# Patient Record
Sex: Male | Born: 1955 | Race: White | Hispanic: No | Marital: Married | Smoking: Never smoker
Health system: Southern US, Community
[De-identification: ages and names within clinical notes are randomized; demographics above are authoritative.]

## PROBLEM LIST (undated history)

## (undated) DIAGNOSIS — R011 Cardiac murmur, unspecified: Secondary | ICD-10-CM

## (undated) DIAGNOSIS — I340 Nonrheumatic mitral (valve) insufficiency: Secondary | ICD-10-CM

## (undated) DIAGNOSIS — E785 Hyperlipidemia, unspecified: Secondary | ICD-10-CM

## (undated) DIAGNOSIS — M545 Low back pain, unspecified: Secondary | ICD-10-CM

## (undated) DIAGNOSIS — J189 Pneumonia, unspecified organism: Secondary | ICD-10-CM

## (undated) DIAGNOSIS — G8929 Other chronic pain: Secondary | ICD-10-CM

## (undated) DIAGNOSIS — Z9889 Other specified postprocedural states: Secondary | ICD-10-CM

## (undated) DIAGNOSIS — I341 Nonrheumatic mitral (valve) prolapse: Secondary | ICD-10-CM

## (undated) DIAGNOSIS — M519 Unspecified thoracic, thoracolumbar and lumbosacral intervertebral disc disorder: Secondary | ICD-10-CM

## (undated) HISTORY — DX: Hyperlipidemia, unspecified: E78.5

## (undated) HISTORY — DX: Unspecified thoracic, thoracolumbar and lumbosacral intervertebral disc disorder: M51.9

## (undated) HISTORY — PX: WISDOM TOOTH EXTRACTION: SHX21

## (undated) HISTORY — DX: Nonrheumatic mitral (valve) insufficiency: I34.0

## (undated) HISTORY — DX: Nonrheumatic mitral (valve) prolapse: I34.1

## (undated) HISTORY — DX: Cardiac murmur, unspecified: R01.1

## (undated) HISTORY — PX: CARDIAC CATHETERIZATION: SHX172

## (undated) HISTORY — PX: VASECTOMY: SHX75

## (undated) HISTORY — PX: COLONOSCOPY: SHX174

---

## 2003-01-16 ENCOUNTER — Encounter: Admission: RE | Admit: 2003-01-16 | Discharge: 2003-01-16 | Payer: Self-pay | Admitting: Family Medicine

## 2006-01-25 ENCOUNTER — Ambulatory Visit: Payer: Self-pay | Admitting: Family Medicine

## 2006-01-25 LAB — CONVERTED CEMR LAB
ALT: 20 units/L (ref 0–40)
AST: 16 units/L (ref 0–37)
Albumin: 3.8 g/dL (ref 3.5–5.2)
Alkaline Phosphatase: 74 units/L (ref 39–117)
BUN: 14 mg/dL (ref 6–23)
Basophils Absolute: 0 10*3/uL (ref 0.0–0.1)
Basophils Relative: 0.6 % (ref 0.0–1.0)
CO2: 31 meq/L (ref 19–32)
Calcium: 9.4 mg/dL (ref 8.4–10.5)
Chloride: 107 meq/L (ref 96–112)
Chol/HDL Ratio, serum: 3.6
Cholesterol: 214 mg/dL (ref 0–200)
Creatinine, Ser: 1 mg/dL (ref 0.4–1.5)
Eosinophil percent: 2.6 % (ref 0.0–5.0)
GFR calc non Af Amer: 84 mL/min
Glomerular Filtration Rate, Af Am: 102 mL/min/{1.73_m2}
Glucose, Bld: 92 mg/dL (ref 70–99)
HCT: 46.7 % (ref 39.0–52.0)
HDL: 59.9 mg/dL (ref >39.0)
Hemoglobin: 15.4 g/dL (ref 13.0–17.0)
LDL DIRECT: 146.2 mg/dL
Lymphocytes Relative: 23.6 % (ref 12.0–46.0)
MCHC: 33 g/dL (ref 30.0–36.0)
MCV: 86.4 fL (ref 78.0–100.0)
Monocytes Absolute: 0.4 10*3/uL (ref 0.2–0.7)
Monocytes Relative: 9 % (ref 3.0–11.0)
Neutro Abs: 2.8 10*3/uL (ref 1.4–7.7)
Neutrophils Relative %: 64.2 % (ref 43.0–77.0)
PSA: 0.52 ng/mL (ref 0.10–4.00)
Platelets: 221 10*3/uL (ref 150–400)
Potassium: 4 meq/L (ref 3.5–5.1)
RBC: 5.41 M/uL (ref 4.22–5.81)
RDW: 12 % (ref 11.5–14.6)
Sodium: 144 meq/L (ref 135–145)
TSH: 3.04 microintl units/mL (ref 0.35–5.50)
Total Bilirubin: 0.9 mg/dL (ref 0.3–1.2)
Total Protein: 6.6 g/dL (ref 6.0–8.3)
Triglyceride fasting, serum: 50 mg/dL (ref 0–149)
VLDL: 10 mg/dL (ref 0–40)
WBC: 4.3 10*3/uL — ABNORMAL LOW (ref 4.5–10.5)

## 2006-04-07 ENCOUNTER — Ambulatory Visit: Payer: Self-pay | Admitting: Family Medicine

## 2007-09-12 ENCOUNTER — Ambulatory Visit: Payer: Self-pay | Admitting: Family Medicine

## 2007-09-17 ENCOUNTER — Ambulatory Visit: Payer: Self-pay | Admitting: Family Medicine

## 2007-09-17 LAB — CONVERTED CEMR LAB
Bilirubin Urine: NEGATIVE
Blood in Urine, dipstick: NEGATIVE
Glucose, Urine, Semiquant: NEGATIVE
Nitrite: NEGATIVE
Protein, U semiquant: NEGATIVE
Specific Gravity, Urine: >=1.03
Urobilinogen, UA: 0.2
WBC Urine, dipstick: NEGATIVE
pH: 6.5

## 2007-12-10 ENCOUNTER — Telehealth: Payer: Self-pay | Admitting: Family Medicine

## 2007-12-12 ENCOUNTER — Encounter: Payer: Self-pay | Admitting: Family Medicine

## 2007-12-14 ENCOUNTER — Encounter: Payer: Self-pay | Admitting: Family Medicine

## 2009-09-21 ENCOUNTER — Emergency Department (HOSPITAL_COMMUNITY): Admission: EM | Admit: 2009-09-21 | Discharge: 2009-09-22 | Payer: Self-pay | Admitting: Emergency Medicine

## 2009-10-05 ENCOUNTER — Ambulatory Visit: Payer: Self-pay | Admitting: Family Medicine

## 2009-10-05 DIAGNOSIS — H6120 Impacted cerumen, unspecified ear: Secondary | ICD-10-CM | POA: Insufficient documentation

## 2009-10-19 ENCOUNTER — Ambulatory Visit: Payer: Self-pay | Admitting: Family Medicine

## 2009-10-19 LAB — CONVERTED CEMR LAB
ALT: 19 units/L (ref 0–53)
AST: 17 units/L (ref 0–37)
Albumin: 3.9 g/dL (ref 3.5–5.2)
Alkaline Phosphatase: 68 units/L (ref 39–117)
BUN: 18 mg/dL (ref 6–23)
Basophils Absolute: 0 10*3/uL (ref 0.0–0.1)
Basophils Relative: 1.3 % (ref 0.0–3.0)
Bilirubin Urine: NEGATIVE
Bilirubin, Direct: 0.1 mg/dL (ref 0.0–0.3)
Blood in Urine, dipstick: NEGATIVE
CO2: 32 meq/L (ref 19–32)
Calcium: 9 mg/dL (ref 8.4–10.5)
Chloride: 103 meq/L (ref 96–112)
Cholesterol: 222 mg/dL — ABNORMAL HIGH (ref 0–200)
Creatinine, Ser: 0.8 mg/dL (ref 0.4–1.5)
Direct LDL: 143.7 mg/dL
Eosinophils Absolute: 0.1 10*3/uL (ref 0.0–0.7)
Eosinophils Relative: 2.2 % (ref 0.0–5.0)
GFR calc non Af Amer: 104.31 mL/min (ref >60)
Glucose, Bld: 85 mg/dL (ref 70–99)
Glucose, Urine, Semiquant: NEGATIVE
HCT: 44.7 % (ref 39.0–52.0)
HDL: 60.2 mg/dL (ref >39.00)
Hemoglobin: 15.2 g/dL (ref 13.0–17.0)
Ketones, urine, test strip: NEGATIVE
Lymphocytes Relative: 23 % (ref 12.0–46.0)
Lymphs Abs: 0.9 10*3/uL (ref 0.7–4.0)
MCHC: 34.1 g/dL (ref 30.0–36.0)
MCV: 86.4 fL (ref 78.0–100.0)
Monocytes Absolute: 0.4 10*3/uL (ref 0.1–1.0)
Monocytes Relative: 9.4 % (ref 3.0–12.0)
Neutro Abs: 2.4 10*3/uL (ref 1.4–7.7)
Neutrophils Relative %: 64.1 % (ref 43.0–77.0)
Nitrite: NEGATIVE
PSA: 0.69 ng/mL (ref 0.10–4.00)
Platelets: 201 10*3/uL (ref 150.0–400.0)
Potassium: 4.4 meq/L (ref 3.5–5.1)
Protein, U semiquant: NEGATIVE
RBC: 5.17 M/uL (ref 4.22–5.81)
RDW: 13 % (ref 11.5–14.6)
Sodium: 142 meq/L (ref 135–145)
Specific Gravity, Urine: 1.025
TSH: 3.08 microintl units/mL (ref 0.35–5.50)
Total Bilirubin: 0.6 mg/dL (ref 0.3–1.2)
Total CHOL/HDL Ratio: 4
Total Protein: 6.5 g/dL (ref 6.0–8.3)
Triglycerides: 110 mg/dL (ref 0.0–149.0)
Urobilinogen, UA: 0.2
VLDL: 22 mg/dL (ref 0.0–40.0)
WBC Urine, dipstick: NEGATIVE
WBC: 3.8 10*3/uL — ABNORMAL LOW (ref 4.5–10.5)
pH: 5

## 2009-10-26 ENCOUNTER — Ambulatory Visit: Payer: Self-pay | Admitting: Family Medicine

## 2009-10-26 DIAGNOSIS — L578 Other skin changes due to chronic exposure to nonionizing radiation: Secondary | ICD-10-CM

## 2009-10-26 DIAGNOSIS — R011 Cardiac murmur, unspecified: Secondary | ICD-10-CM

## 2009-11-20 ENCOUNTER — Ambulatory Visit: Payer: Self-pay | Admitting: Cardiology

## 2009-11-20 ENCOUNTER — Encounter: Payer: Self-pay | Admitting: Family Medicine

## 2009-11-20 ENCOUNTER — Ambulatory Visit (HOSPITAL_COMMUNITY): Admission: RE | Admit: 2009-11-20 | Discharge: 2009-11-20 | Payer: Self-pay | Admitting: Family Medicine

## 2009-11-20 ENCOUNTER — Ambulatory Visit: Payer: Self-pay

## 2009-11-23 ENCOUNTER — Telehealth: Payer: Self-pay | Admitting: Family Medicine

## 2010-01-27 ENCOUNTER — Ambulatory Visit: Payer: Self-pay | Admitting: Cardiology

## 2010-04-08 ENCOUNTER — Encounter: Payer: Self-pay | Admitting: Gastroenterology

## 2010-04-13 NOTE — Assessment & Plan Note (Signed)
Summary: ear issues//ccm   Vital Signs:  Patient profile:   55 year old male Weight:      174 pounds Temp:     97.9 degrees F oral BP sitting:   124 / 90  (left arm) Cuff size:   regular  Vitals Entered By: Kathrynn Speed CMA (October 05, 2009 2:58 PM) CC: left ear water trapped in between ear drum & ear wax? , src   History of Present Illness: Bilateral ear fullness. Noted onset a few days ago. No hearing change. Denies ear pain. History of frequent cerumen buildup in past. Denies any vertigo. Tried some wax softener without improvement.  Current Medications (verified): 1)  Adult Aspirin Ec Low Strength 81 Mg  Tbec (Aspirin) .... Once Daily  Allergies (verified): No Known Drug Allergies  Past History:  Past Medical History: Last updated: 09/17/2007 Unremarkable  Physical Exam  General:  Well-developed,well-nourished,in no acute distress; alert,appropriate and cooperative throughout examination Ears:  bilateral cerumen impaction removed with irrigation without difficulty Neck:  No deformities, masses, or tenderness noted. Lungs:  Normal respiratory effort, chest expands symmetrically. Lungs are clear to auscultation, no crackles or wheezes. Heart:  normal rate and regular rhythm.     Impression & Recommendations:  Problem # 1:  CERUMEN IMPACTION (ICD-380.4) removed by irrigation without difficulty. F/U p.r.n.  Complete Medication List: 1)  Adult Aspirin Ec Low Strength 81 Mg Tbec (Aspirin) .... Once daily

## 2010-04-13 NOTE — Progress Notes (Signed)
  Phone Note Outgoing Call   Summary of Call: I reviewed the summary of the echo with the posterior leaflet of the mitral valve incompetent.  Patient requesting cardiology consult with Dr. Peter Swaziland Initial call taken by: Roderick Pee MD,  November 23, 2009 2:28 PM

## 2010-04-13 NOTE — Assessment & Plan Note (Signed)
Summary: CPX/cb   Vital Signs:  Patient profile:   55 year old male Height:      70 inches Weight:      172 pounds BMI:     24.77 Temp:     98.2 degrees F oral BP sitting:   130 / 80  (left arm) Cuff size:   regular  Vitals Entered By: Kern Reap CMA Duncan Dull) (October 26, 2009 1:57 PM) CC: cpx Is Patient Diabetic? No Pain Assessment Patient in pain? no        CC:  cpx.  History of Present Illness: Alan Mcguire is a 55 year old, married male, nonsmoker, who comes in today for general physical examination  We saw him last about two years ago.  He's done well with no major problems.  His biggest long-term risk factor is skin cancer, because he's been in the sun, so much throughout his life.  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  He seen a dermatologist and has 3 lesions removed.  Over the past two years, none of which were cancer, but they were dysplastic.  He was advised to but does not wear sunscreens.  Youngest son is going to be his last year in middle school [employer], one daughter still in high school, one daughter, Producer, television/film/video and the other daughter works for [employer] and soon to be married.  His wife Alan Mcguire  works with the heart team at the hospital  also recently he had a reaction to bee stings.  He stated he got stung by 4 or 5 yellow jackets and about a half hour 45 minutes later he began to have itching and then broke out in total body hives.  Also had some airway edema went directly to the emergency room.  In the ER.  They gave IV fluids adrenaline IV steroids.  He had no sequelae after that.  However, I recommend he see Dr. Willa Rough for further evaluation.  Last tetanus booster unknown.  Will give him a booster today  Allergies (verified): 1)  ! * Bee Stings  Past History:  Past medical, surgical, family and social histories (including risk factors) reviewed, and no changes noted (except as noted below).  Past Medical History: Reviewed history  from 09/17/2007 and no changes required. Unremarkable  Family History: Reviewed history from 09/17/2007 and no changes required.  father died of kidney failure, hypertensionmother has a history of lung cancer smoker  one brother in good healthone sister in good health  Social History: Reviewed history from 09/17/2007 and no changes required. Occupation: Married Never Smoked Alcohol use-no Drug use-no Regular exercise-yes  Review of Systems      See HPI  Physical Exam  General:  Well-developed,well-nourished,in no acute distress; alert,appropriate and cooperative throughout examination Head:  Normocephalic and atraumatic without obvious abnormalities. No apparent alopecia or balding. Eyes:  No corneal or conjunctival inflammation noted. EOMI. Perrla. Funduscopic exam benign, without hemorrhages, exudates or papilledema. Vision grossly normal. Ears:  External ear exam shows no significant lesions or deformities.  Otoscopic examination reveals clear canals, tympanic membranes are intact bilaterally without bulging, retraction, inflammation or discharge. Hearing is grossly normal bilaterally. Nose:  External nasal examination shows no deformity or inflammation. Nasal mucosa are pink and moist without lesions or exudates. Mouth:  Oral mucosa and oropharynx without lesions or exudates.  Teeth in good repair. Neck:  No deformities, masses, or tenderness noted. Chest Wall:  No deformities, masses, tenderness or gynecomastia noted. Breasts:  No masses or gynecomastia noted Lungs:  Normal respiratory effort, chest expands symmetrically. Lungs are clear to auscultation, no crackles or wheezes. Heart:  the PMI is in the fifth intercostal space in the midclavicular line.  No heaves, or thrills.  S1, S2, within normal limits.  This grade 2 soft, blowing diastolic murmur, heard best at the apex, consistent with a mitral regurg-type murmur.  This is a new physical finding.  An echo will be done for  verification Abdomen:  Bowel sounds positive,abdomen soft and non-tender without masses, organomegaly or hernias noted. Rectal:  No external abnormalities noted. Normal sphincter tone. No rectal masses or tenderness. Genitalia:  Testes bilaterally descended without nodularity, tenderness or masses. No scrotal masses or lesions. No penis lesions or urethral discharge. Prostate:  no nodules, no asymmetry, and 1+ enlarged.   Msk:  No deformity or scoliosis noted of thoracic or lumbar spine.   Pulses:  R and L carotid,radial,femoral,dorsalis pedis and posterior tibial pulses are full and equal bilaterally Extremities:  No clubbing, cyanosis, edema, or deformity noted with normal full range of motion of all joints.   Neurologic:  No cranial nerve deficits noted. Station and gait are normal. Plantar reflexes are down-going bilaterally. DTRs are symmetrical throughout. Sensory, motor and coordinative functions appear intact. Skin:  one scar anterior chest wall, another scar, left anterior chest wall and a scar on his back.  The two and his chest well healed.  The one the back has some irregular margins.  Advised him to go back and see the dermatologist Cervical Nodes:  No lymphadenopathy noted Axillary Nodes:  No palpable lymphadenopathy Inguinal Nodes:  No significant adenopathy Psych:  Cognition and judgment appear intact. Alert and cooperative with normal attention span and concentration. 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 Once daily  Other Orders: Tdap => 80yrs IM (20254) Admin 1st Vaccine (27062)  Patient Instructions: 1)  remembered to wear the SPF 50+ screens and hat  2)  We will set you up for an echocardiogram to determine the etiology of the new murmur.  When we get the report I will call you 3)  If you have any major surgery or major dental work.  Let us know we would give you a prophylactic antibiotics 4)  Take an Aspirin every day. 5)  Please schedule a follow-up appointment in 1 year.   Immunizations Administered:  Tetanus Vaccine:    Vaccine Type: Tdap    Site: left deltoid    Mfr: GlaxoSmithKline    Dose: 0.5 ml    Route: IM    Given by: Kern Reap CMA (AAMA)    Exp. Date: 09/11/2011    Lot #: [id number]    VIS given: 01/30/07 version given October 26, 2009.    Physician counseled: yes

## 2010-04-15 NOTE — Letter (Signed)
Summary: Colonoscopy Letter  [facility]  [address] [place], [place] [address]   Phone: [phone]  Fax: [phone]      April 08, 2010 MRN: [id number]   Alan Mcguire [address] [place], [place]  [address]   Dear [name],   According to your medical record, it is time for you to schedule a Colonoscopy. The American Cancer Society recommends this procedure as a method to detect early colon cancer. Patients with a family history of colon cancer, or a personal history of colon polyps or inflammatory bowel disease are at increased risk.  This letter has been generated based on the recommendations made at the time of your procedure. If you feel that in your particular situation this may no longer apply, please contact our office.  Please call our office at [phone] to schedule this appointment or to update your records at your earliest convenience.  Thank you for cooperating with [place] to provide you with the very best care possible.   Sincerely,  Judie Petit T. Russella Dar, M.D.  [facility] [phone]

## 2010-05-30 LAB — POCT I-STAT, CHEM 8
BUN: 20 mg/dL (ref 6–23)
Calcium, Ion: 1.16 mmol/L (ref 1.12–1.32)
Chloride: 107 mEq/L (ref 96–112)
Creatinine, Ser: 1.1 mg/dL (ref 0.4–1.5)
Glucose, Bld: 97 mg/dL (ref 70–99)
HCT: 49 % (ref 39.0–52.0)
Hemoglobin: 16.7 g/dL (ref 13.0–17.0)
Potassium: 3.9 mEq/L (ref 3.5–5.1)
Sodium: 141 mEq/L (ref 135–145)
TCO2: 25 mmol/L (ref 0–100)

## 2011-05-05 ENCOUNTER — Encounter: Payer: Self-pay | Admitting: *Deleted

## 2011-05-06 ENCOUNTER — Encounter: Payer: Self-pay | Admitting: *Deleted

## 2011-05-06 DIAGNOSIS — I341 Nonrheumatic mitral (valve) prolapse: Secondary | ICD-10-CM | POA: Insufficient documentation

## 2011-05-06 DIAGNOSIS — M519 Unspecified thoracic, thoracolumbar and lumbosacral intervertebral disc disorder: Secondary | ICD-10-CM | POA: Insufficient documentation

## 2011-05-11 ENCOUNTER — Ambulatory Visit (INDEPENDENT_AMBULATORY_CARE_PROVIDER_SITE_OTHER): Payer: Medicare PPO | Admitting: Cardiology

## 2011-05-11 ENCOUNTER — Encounter: Payer: Self-pay | Admitting: Cardiology

## 2011-05-11 VITALS — BP 134/77 | HR 72 | Ht 70.0 in | Wt 184.8 lb

## 2011-05-11 DIAGNOSIS — I059 Rheumatic mitral valve disease, unspecified: Secondary | ICD-10-CM

## 2011-05-11 DIAGNOSIS — I341 Nonrheumatic mitral (valve) prolapse: Secondary | ICD-10-CM

## 2011-05-11 NOTE — Patient Instructions (Signed)
We will schedule you for an echocardiogram.  I will see you again in 1 year.

## 2011-05-11 NOTE — Progress Notes (Signed)
   Alan Mcguire Date of Birth: 1955/03/27 Medical Record #[id number]  History of Present Illness: Alan Mcguire is seen for yearly followup. He has a history of mitral valve prolapse with moderate mitral insufficiency. His last echocardiogram was in September of 2011. He reports no complaints on followup today. He denies any dyspnea, palpitations, or chest pain. He remains active and very exercises regularly. He denies any new medical problems over the past year.  Current Outpatient Prescriptions on File Prior to Visit  Medication Sig Dispense Refill  . aspirin EC 81 MG tablet Take 81 mg by mouth daily.        Allergies  Allergen Reactions  . Other     Bee stings    Past Medical History  Diagnosis Date  . MVP (mitral valve prolapse)   . Lumbar disc disease     L4-5  . Murmur 9/92011    echo-MVP posterior leaflet with moderate mitral insufficiency and the jet directed anteriorly,left atrium mildly enlarged    History reviewed. No pertinent past surgical history.  History  Smoking status  . Never Smoker   Smokeless tobacco  . Not on file    History  Alcohol Use No    Family History  Problem Relation Age of Onset  . Kidney failure Father     died  . Hypertension Mother   . Lung cancer Mother     Review of Systems: As noted in history of present illness .  All other systems were reviewed and are negative.  Physical Exam: BP 134/77  Pulse 72  Ht 5\' 10"  (1.778 m)  Wt 184 lb 12.8 oz (83.825 kg)  BMI 26.52 kg/m2  he is a well-developed white male in no acute distress. His HEENT exam is unremarkable. He has no JVD or bruits. Lungs are clear. Cardiac exam reveals a regular rate and rhythm with a grade 2/6 midsystolic murmur at the apex.  There is no diastolic murmur or S3. Abdomen is soft and nontender without masses or bruits. Extremities are without edema. Pulses are 2+ and symmetric.  LABORATORY DATA: ECG today shows normal sinus rhythm. Minimal voltage criteria for  LVH. It is otherwise normal.  Assessment / Plan:

## 2011-05-11 NOTE — Assessment & Plan Note (Signed)
He is asymptomatic. His exam is stable today. We'll schedule him for a followup echocardiogram and compare with September 2011. If it is stable we will plan on followup again in one year for clinical exam.

## 2011-05-25 ENCOUNTER — Other Ambulatory Visit: Payer: Self-pay

## 2011-05-25 ENCOUNTER — Ambulatory Visit (HOSPITAL_COMMUNITY): Payer: Medicare PPO | Attending: Cardiology

## 2011-05-25 DIAGNOSIS — I379 Nonrheumatic pulmonary valve disorder, unspecified: Secondary | ICD-10-CM | POA: Insufficient documentation

## 2011-05-25 DIAGNOSIS — I080 Rheumatic disorders of both mitral and aortic valves: Secondary | ICD-10-CM | POA: Insufficient documentation

## 2011-05-25 DIAGNOSIS — I059 Rheumatic mitral valve disease, unspecified: Secondary | ICD-10-CM

## 2011-05-25 DIAGNOSIS — I341 Nonrheumatic mitral (valve) prolapse: Secondary | ICD-10-CM

## 2011-05-25 DIAGNOSIS — I079 Rheumatic tricuspid valve disease, unspecified: Secondary | ICD-10-CM | POA: Insufficient documentation

## 2011-06-01 ENCOUNTER — Ambulatory Visit (INDEPENDENT_AMBULATORY_CARE_PROVIDER_SITE_OTHER): Payer: Medicare PPO | Admitting: *Deleted

## 2011-06-01 ENCOUNTER — Encounter: Payer: Self-pay | Admitting: *Deleted

## 2011-06-01 DIAGNOSIS — I781 Nevus, non-neoplastic: Secondary | ICD-10-CM | POA: Insufficient documentation

## 2011-06-01 NOTE — Progress Notes (Signed)
X=.3% Sotradecol administered with a 27g butterfly.  Patient received a total of 6cc foam.  Treated all areas of concern. Small blue spiders. Tol well. Easy access. Follow prn.  Cutaneous Laser:pulsed mode  2.6 watts 30 secs.  pulses .3  Total pulses: 79 Total energy 205  Total time:.02  Photos: yes  Compression stockings applied: yes

## 2011-06-20 ENCOUNTER — Other Ambulatory Visit (INDEPENDENT_AMBULATORY_CARE_PROVIDER_SITE_OTHER): Payer: Medicare PPO

## 2011-06-20 DIAGNOSIS — Z125 Encounter for screening for malignant neoplasm of prostate: Secondary | ICD-10-CM

## 2011-06-20 DIAGNOSIS — R011 Cardiac murmur, unspecified: Secondary | ICD-10-CM

## 2011-06-20 DIAGNOSIS — Z0000 Encounter for general adult medical examination without abnormal findings: Secondary | ICD-10-CM

## 2011-06-20 DIAGNOSIS — Z13220 Encounter for screening for lipoid disorders: Secondary | ICD-10-CM

## 2011-06-20 LAB — CBC WITH DIFFERENTIAL/PLATELET
Basophils Absolute: 0 10*3/uL (ref 0.0–0.1)
Basophils Relative: 0.7 % (ref 0.0–3.0)
Eosinophils Absolute: 0.1 10*3/uL (ref 0.0–0.7)
Eosinophils Relative: 1.2 % (ref 0.0–5.0)
HCT: 45.9 % (ref 39.0–52.0)
Hemoglobin: 15.2 g/dL (ref 13.0–17.0)
Lymphocytes Relative: 12.7 % (ref 12.0–46.0)
Lymphs Abs: 0.8 10*3/uL (ref 0.7–4.0)
MCHC: 33.1 g/dL (ref 30.0–36.0)
MCV: 87.5 fl (ref 78.0–100.0)
Monocytes Absolute: 0.5 10*3/uL (ref 0.1–1.0)
Monocytes Relative: 7.7 % (ref 3.0–12.0)
Neutro Abs: 5.1 10*3/uL (ref 1.4–7.7)
Neutrophils Relative %: 77.7 % — ABNORMAL HIGH (ref 43.0–77.0)
Platelets: 191 10*3/uL (ref 150.0–400.0)
RBC: 5.25 Mil/uL (ref 4.22–5.81)
RDW: 13 % (ref 11.5–14.6)
WBC: 6.6 10*3/uL (ref 4.5–10.5)

## 2011-06-20 LAB — POCT URINALYSIS DIPSTICK
Bilirubin, UA: NEGATIVE
Blood, UA: NEGATIVE
Glucose, UA: NEGATIVE
Ketones, UA: NEGATIVE
Leukocytes, UA: NEGATIVE
Nitrite, UA: NEGATIVE
Spec Grav, UA: 1.02
Urobilinogen, UA: 0.2
pH, UA: 7

## 2011-06-20 LAB — HEPATIC FUNCTION PANEL
ALT: 15 U/L (ref 0–53)
AST: 16 U/L (ref 0–37)
Albumin: 4.4 g/dL (ref 3.5–5.2)
Alkaline Phosphatase: 75 U/L (ref 39–117)
Bilirubin, Direct: 0.1 mg/dL (ref 0.0–0.3)
Total Bilirubin: 1 mg/dL (ref 0.3–1.2)
Total Protein: 7.2 g/dL (ref 6.0–8.3)

## 2011-06-20 LAB — LIPID PANEL
Cholesterol: 215 mg/dL — ABNORMAL HIGH (ref 0–200)
HDL: 67.8 mg/dL (ref >39.00)
Total CHOL/HDL Ratio: 3
Triglycerides: 86 mg/dL (ref 0.0–149.0)
VLDL: 17.2 mg/dL (ref 0.0–40.0)

## 2011-06-20 LAB — BASIC METABOLIC PANEL
BUN: 17 mg/dL (ref 6–23)
CO2: 29 mEq/L (ref 19–32)
Calcium: 9.3 mg/dL (ref 8.4–10.5)
Chloride: 105 mEq/L (ref 96–112)
Creatinine, Ser: 0.9 mg/dL (ref 0.4–1.5)
GFR: 91.92 mL/min (ref >60.00)
Glucose, Bld: 90 mg/dL (ref 70–99)
Potassium: 3.8 mEq/L (ref 3.5–5.1)
Sodium: 142 mEq/L (ref 135–145)

## 2011-06-20 LAB — LDL CHOLESTEROL, DIRECT: Direct LDL: 123.4 mg/dL

## 2011-06-20 LAB — PSA: PSA: 5.05 ng/mL — ABNORMAL HIGH (ref 0.10–4.00)

## 2011-06-20 LAB — TSH: TSH: 2.72 u[IU]/mL (ref 0.35–5.50)

## 2011-06-22 ENCOUNTER — Telehealth: Payer: Self-pay | Admitting: Family Medicine

## 2011-06-22 NOTE — Telephone Encounter (Signed)
Left message on machine for patient

## 2011-06-22 NOTE — Telephone Encounter (Signed)
Pt already had lab work but had to reschedule the physical for July. Does pt need to come in sooner since he already receive lab work or will this appt be all right?

## 2011-06-30 ENCOUNTER — Encounter: Payer: Self-pay | Admitting: Family Medicine

## 2011-09-20 ENCOUNTER — Ambulatory Visit (INDEPENDENT_AMBULATORY_CARE_PROVIDER_SITE_OTHER): Payer: Medicare PPO | Admitting: Family Medicine

## 2011-09-20 ENCOUNTER — Encounter: Payer: Self-pay | Admitting: Family Medicine

## 2011-09-20 VITALS — BP 110/70 | Temp 98.3°F | Ht 71.5 in | Wt 176.0 lb

## 2011-09-20 DIAGNOSIS — I059 Rheumatic mitral valve disease, unspecified: Secondary | ICD-10-CM

## 2011-09-20 DIAGNOSIS — Z0000 Encounter for general adult medical examination without abnormal findings: Secondary | ICD-10-CM

## 2011-09-20 DIAGNOSIS — R011 Cardiac murmur, unspecified: Secondary | ICD-10-CM

## 2011-09-20 DIAGNOSIS — L578 Other skin changes due to chronic exposure to nonionizing radiation: Secondary | ICD-10-CM

## 2011-09-20 DIAGNOSIS — I341 Nonrheumatic mitral (valve) prolapse: Secondary | ICD-10-CM

## 2011-09-20 DIAGNOSIS — R972 Elevated prostate specific antigen [PSA]: Secondary | ICD-10-CM

## 2011-09-20 MED ORDER — CIPROFLOXACIN HCL 250 MG PO TABS: ORAL_TABLET | ORAL | Status: DC

## 2011-09-20 NOTE — Patient Instructions (Signed)
Take the Cipro 1 twice daily for one month  Return in 6 weeks for a followup PSA  Office visit 1 week after lab  Remember the SPF 50+ screens and a hat with a bill to protect u  skin  Return in one year for general physical examination

## 2011-09-20 NOTE — Progress Notes (Signed)
  Subjective:    Patient ID: Alan Mcguire, male    DOB: 1955-11-17, 56 y.o.   MRN: [id number]  HPI [name] is a delightful 56 year old married male nonsmoker who comes in today for general physical examination  He is a history of mitral valve prolapse currently asymptomatic he gets an annual listened to by his cardiologist Dr. Swaziland. Followup in the spring of 2013 showed no change in the murmur.  He gets routine eye care, dental care, colonoscopy in his early 38s normal, tetanus 2011.  He continues to get a lot of sun exposure his occupation is swimming pool maintenance. Again advised to wear sunscreens SPF 50+ baseball cap etc.  His oldest daughter [name] got married last year, [name] is gainfully and Maybeury, [name] is going to [place] you next year hopefully in the nursing program and his son is in the 10th grade at [employer]   Review of Systems  Constitutional: Negative.   HENT: Negative.   Eyes: Negative.   Respiratory: Negative.   Cardiovascular: Negative.   Gastrointestinal: Negative.   Genitourinary: Negative.   Musculoskeletal: Negative.   Skin: Negative.   Neurological: Negative.   Hematological: Negative.   Psychiatric/Behavioral: Negative.        Objective:   Physical Exam  Constitutional: He is oriented to person, place, and time. He appears well-developed and well-nourished.  HENT:  Head: Normocephalic and atraumatic.  Right Ear: External ear normal.  Left Ear: External ear normal.  Nose: Nose normal.  Mouth/Throat: Oropharynx is clear and moist.  Eyes: Conjunctivae and EOM are normal. Pupils are equal, round, and reactive to light.  Neck: Normal range of motion. Neck supple. No JVD present. No tracheal deviation present. No thyromegaly present.  Cardiovascular: Normal rate, regular rhythm, normal heart sounds and intact distal pulses.  Exam reveals no gallop and no friction rub.   No murmur heard. Pulmonary/Chest: Effort normal and breath sounds  normal. No stridor. No respiratory distress. He has no wheezes. He has no rales. He exhibits no tenderness.  Abdominal: Soft. Bowel sounds are normal. He exhibits no distension and no mass. There is no tenderness. There is no rebound and no guarding.  Genitourinary: Rectum normal, prostate normal and penis normal. Guaiac negative stool. No penile tenderness.       He has bilateral bulging in the inguinal canal no ostensible hernia  Prostate normal size nonnodular  Musculoskeletal: Normal range of motion. He exhibits no edema and no tenderness.  Lymphadenopathy:    He has no cervical adenopathy.  Neurological: He is alert and oriented to person, place, and time. He has normal reflexes. No cranial nerve deficit. He exhibits normal muscle tone.  Skin: Skin is warm and dry. No rash noted. No erythema. No pallor.  Psychiatric: He has a normal mood and affect. His behavior is normal. Judgment and thought content normal.          Assessment & Plan:  Healthy male  History of mitral valve prolapse currently asymptomatic  Elevated PSA plan urology consult

## 2011-11-01 ENCOUNTER — Other Ambulatory Visit: Payer: Medicare PPO

## 2011-11-08 ENCOUNTER — Other Ambulatory Visit: Payer: Medicare PPO | Admitting: Family Medicine

## 2011-11-08 ENCOUNTER — Other Ambulatory Visit (INDEPENDENT_AMBULATORY_CARE_PROVIDER_SITE_OTHER): Payer: Medicare PPO

## 2011-11-08 DIAGNOSIS — R972 Elevated prostate specific antigen [PSA]: Secondary | ICD-10-CM

## 2011-11-08 LAB — PSA: PSA: 1.82 ng/mL (ref 0.10–4.00)

## 2011-11-28 ENCOUNTER — Encounter: Payer: Self-pay | Admitting: Gastroenterology

## 2012-04-12 ENCOUNTER — Other Ambulatory Visit: Payer: Self-pay | Admitting: Family Medicine

## 2012-04-12 DIAGNOSIS — R972 Elevated prostate specific antigen [PSA]: Secondary | ICD-10-CM

## 2012-04-17 ENCOUNTER — Other Ambulatory Visit (INDEPENDENT_AMBULATORY_CARE_PROVIDER_SITE_OTHER): Payer: Medicare PPO

## 2012-04-17 DIAGNOSIS — R972 Elevated prostate specific antigen [PSA]: Secondary | ICD-10-CM

## 2012-04-17 LAB — PSA: PSA: 0.75 ng/mL (ref 0.10–4.00)

## 2012-04-17 NOTE — Patient Instructions (Signed)
, °

## 2012-05-02 ENCOUNTER — Encounter: Payer: Self-pay | Admitting: Family Medicine

## 2012-05-15 ENCOUNTER — Encounter: Payer: Self-pay | Admitting: *Deleted

## 2012-05-16 ENCOUNTER — Ambulatory Visit (INDEPENDENT_AMBULATORY_CARE_PROVIDER_SITE_OTHER): Payer: Medicare PPO | Admitting: *Deleted

## 2012-05-16 DIAGNOSIS — I781 Nevus, non-neoplastic: Secondary | ICD-10-CM

## 2012-05-16 NOTE — Progress Notes (Signed)
X=.3% Sotradecol administered with a 27g butterfly.  Patient received a total of 6cc.  Right worse than left. He has developed some new spider veins on both ankles. Injected everything I saw. Easy access. Tol well. Follow prn.   Photos: yes  Compression stockings applied: yes

## 2012-05-17 ENCOUNTER — Encounter: Payer: Self-pay | Admitting: Vascular Surgery

## 2012-07-24 ENCOUNTER — Encounter: Payer: Self-pay | Admitting: Cardiology

## 2012-07-24 ENCOUNTER — Ambulatory Visit (INDEPENDENT_AMBULATORY_CARE_PROVIDER_SITE_OTHER): Payer: Medicare PPO | Admitting: Cardiology

## 2012-07-24 VITALS — BP 149/78 | HR 83 | Ht 70.5 in | Wt 176.4 lb

## 2012-07-24 DIAGNOSIS — I341 Nonrheumatic mitral (valve) prolapse: Secondary | ICD-10-CM

## 2012-07-24 DIAGNOSIS — I059 Rheumatic mitral valve disease, unspecified: Secondary | ICD-10-CM

## 2012-07-24 NOTE — Progress Notes (Signed)
   Alan Mcguire Date of Birth: 12/27/1955 Medical Record #[id number]  History of Present Illness: Alan Mcguire is seen for yearly followup of mitral valve prolapse. He has a history of mitral valve prolapse with moderate mitral insufficiency. His last echocardiogram was in March of 2013. He reports no complaints on followup today. He denies any dyspnea, palpitations, or chest pain. He remains active and very exercises regularly. He does note some mild increased cardiac awareness when he is lying on his left side.  Current Outpatient Prescriptions on File Prior to Visit  Medication Sig Dispense Refill  . aspirin EC 81 MG tablet Take 81 mg by mouth daily.       No current facility-administered medications on file prior to visit.    Allergies  Allergen Reactions  . Other     Bee stings    Past Medical History  Diagnosis Date  . MVP (mitral valve prolapse)   . Lumbar disc disease     L4-5  . Murmur 9/92011    echo-MVP posterior leaflet with moderate mitral insufficiency and the jet directed anteriorly,left atrium mildly enlarged    History reviewed. No pertinent past surgical history.  History  Smoking status  . Never Smoker   Smokeless tobacco  . Not on file    History  Alcohol Use No    Family History  Problem Relation Age of Onset  . Kidney failure Father     died  . Hypertension Mother   . Lung cancer Mother     Review of Systems: As noted in history of present illness .  All other systems were reviewed and are negative.  Physical Exam: BP 149/78  Pulse 83  Ht 5' 10.5" (1.791 m)  Wt 176 lb 6.4 oz (80.015 kg)  BMI 24.94 kg/m2  he is a well-developed white male in no acute distress. His HEENT exam is unremarkable. He has no JVD or bruits. Lungs are clear. Cardiac exam reveals a regular rate and rhythm with a grade 2/6 midsystolic murmur at the apex.  There is no diastolic murmur or S3. Abdomen is soft and nontender without masses or bruits. Extremities are  without edema. Pulses are 2+ and symmetric.  LABORATORY DATA: ECG today shows normal sinus rhythm. Minimal voltage criteria for LVH. It is otherwise normal. This is unchanged from one year ago.  Assessment / Plan: 1. Mitral valve prolapse with moderate mitral insufficiency. He is asymptomatic. We will continue surveillance with follow up in one year and probably repeat an echocardiogram at that time.

## 2012-07-24 NOTE — Patient Instructions (Signed)
I will see you again in one year.

## 2013-08-19 ENCOUNTER — Telehealth: Payer: Self-pay | Admitting: Family Medicine

## 2013-08-19 DIAGNOSIS — Z1211 Encounter for screening for malignant neoplasm of colon: Secondary | ICD-10-CM

## 2013-08-19 NOTE — Telephone Encounter (Signed)
Pt req referral to [facility] he has an appt scheduled for 10/11/13  For his colonoscopy

## 2013-08-19 NOTE — Telephone Encounter (Signed)
referral placed

## 2013-08-20 ENCOUNTER — Encounter: Payer: Self-pay | Admitting: Gastroenterology

## 2013-09-27 ENCOUNTER — Ambulatory Visit (AMBULATORY_SURGERY_CENTER): Payer: Self-pay

## 2013-09-27 VITALS — Ht 71.0 in | Wt 170.0 lb

## 2013-09-27 DIAGNOSIS — Z1211 Encounter for screening for malignant neoplasm of colon: Secondary | ICD-10-CM

## 2013-09-27 MED ORDER — MOVIPREP 100 G PO SOLR: 1.0000 | Freq: Once | ORAL | Status: DC

## 2013-09-27 NOTE — Progress Notes (Signed)
No allergies to eggs or soy No past problems with anesthesia No home oxygen No diet/weight loss meds  Has email  Emmi instructions given for colonoscopy 

## 2013-10-11 ENCOUNTER — Encounter: Payer: Self-pay | Admitting: Gastroenterology

## 2013-10-11 ENCOUNTER — Ambulatory Visit (AMBULATORY_SURGERY_CENTER): Payer: Medicare PPO | Admitting: Gastroenterology

## 2013-10-11 VITALS — BP 129/84 | HR 66 | Temp 96.1°F | Resp 14 | Ht 71.0 in | Wt 170.0 lb

## 2013-10-11 DIAGNOSIS — D126 Benign neoplasm of colon, unspecified: Secondary | ICD-10-CM

## 2013-10-11 DIAGNOSIS — Z1211 Encounter for screening for malignant neoplasm of colon: Secondary | ICD-10-CM

## 2013-10-11 DIAGNOSIS — D123 Benign neoplasm of transverse colon: Secondary | ICD-10-CM

## 2013-10-11 MED ORDER — SODIUM CHLORIDE 0.9 % IV SOLN: 500.0000 mL | INTRAVENOUS | Status: DC

## 2013-10-11 NOTE — Progress Notes (Signed)
Called to room to assist during endoscopic procedure.  Patient ID and intended procedure confirmed with present staff. Received instructions for my participation in the procedure from the performing physician.  

## 2013-10-11 NOTE — Progress Notes (Signed)
Report to PACU, RN, vss, BBS= Clear.  

## 2013-10-11 NOTE — Patient Instructions (Signed)
YOU HAD AN ENDOSCOPIC PROCEDURE TODAY AT THE [facility]: Refer to the procedure report that was given to you for any specific questions about what was found during the examination.  If the procedure report does not answer your questions, please call your gastroenterologist to clarify.  If you requested that your care partner not be given the details of your procedure findings, then the procedure report has been included in a sealed envelope for you to review at your convenience later.  YOU SHOULD EXPECT: Some feelings of bloating in the abdomen. Passage of more gas than usual.  Walking can help get rid of the air that was put into your GI tract during the procedure and reduce the bloating. If you had a lower endoscopy (such as a colonoscopy or flexible sigmoidoscopy) you may notice spotting of blood in your stool or on the toilet paper. If you underwent a bowel prep for your procedure, then you may not have a normal bowel movement for a few days.  DIET: Your first meal following the procedure should be a light meal and then it is ok to progress to your normal diet.  A half-sandwich or bowl of soup is an example of a good first meal.  Heavy or fried foods are harder to digest and may make you feel nauseous or bloated.  Likewise meals heavy in dairy and vegetables can cause extra gas to form and this can also increase the bloating.  Drink plenty of fluids but you should avoid alcoholic beverages for 24 hours.  ACTIVITY: Your care partner should take you home directly after the procedure.  You should plan to take it easy, moving slowly for the rest of the day.  You can resume normal activity the day after the procedure however you should NOT DRIVE or use heavy machinery for 24 hours (because of the sedation medicines used during the test).    SYMPTOMS TO REPORT IMMEDIATELY: A gastroenterologist can be reached at any hour.  During normal business hours, 8:30 AM to 5:00 PM Monday through Friday,  call [phone].  After hours and on weekends, please call the GI answering service at [phone] who will take a message and have the physician on call contact you.   Following lower endoscopy (colonoscopy or flexible sigmoidoscopy):  Excessive amounts of blood in the stool  Significant tenderness or worsening of abdominal pains  Swelling of the abdomen that is new, acute  Fever of 100F or higher  FOLLOW UP: If any biopsies were taken you will be contacted by phone or by letter within the next 1-3 weeks.  Call your gastroenterologist if you have not heard about the biopsies in 3 weeks.  Our staff will call the home number listed on your records the next business day following your procedure to check on you and address any questions or concerns that you may have at that time regarding the information given to you following your procedure. This is a courtesy call and so if there is no answer at the home number and we have not heard from you through the emergency physician on call, we will assume that you have returned to your regular daily activities without incident.  SIGNATURES/CONFIDENTIALITY: You and/or your care partner have signed paperwork which will be entered into your electronic medical record.  These signatures attest to the fact that that the information above on your After Visit Summary has been reviewed and is understood.  Full responsibility of the confidentiality of this   discharge information lies with you and/or your care-partner.  Recommendations Await pathology results. Repeat colonoscopy in 5 years if polyp adenomatous; otherwise 10 years.

## 2013-10-11 NOTE — Op Note (Signed)
[place]  [address]. [place], [address]   COLONOSCOPY PROCEDURE REPORT  PATIENT: Alan Mcguire, Alan Mcguire  MR#: [id number] BIRTHDATE: 02/10/1956 , 58  yrs. old GENDER: Male ENDOSCOPIST: Ladene Artist, MD, [facility] PROCEDURE DATE:  10/11/2013 PROCEDURE:   Colonoscopy with snare polypectomy First Screening Colonoscopy - Avg.  risk and is 50 yrs.  old or older - No.  Prior Negative Screening - Now for repeat screening. N/A  History of Adenoma - Now for follow-up colonoscopy & has been > or = to 3 yrs.  N/A  Polyps Removed Today? Yes. ASA CLASS:   Class II INDICATIONS:average risk screening. MEDICATIONS: MAC sedation, administered by CRNA and propofol (Diprivan) 240mg  IV DESCRIPTION OF PROCEDURE:   After the risks benefits and alternatives of the procedure were thoroughly explained, informed consent was obtained.  A digital rectal exam revealed no abnormalities of the rectum.   The LB [id number]  endoscope was introduced through the anus and advanced to the cecum, which was identified by both the appendix and ileocecal valve. No adverse events experienced.   The quality of the prep was excellent, using MoviPrep  The instrument was then slowly withdrawn as the colon was fully examined.  COLON FINDINGS: A semi-pedunculated polyp measuring 7 mm in size was found in the transverse colon.  A polypectomy was performed with a cold snare.  The resection was complete and the polyp tissue was completely retrieved.   The colon was otherwise normal.  There was no diverticulosis, inflammation, polyps or cancers unless previously stated.  Retroflexed views revealed no abnormalities. The time to cecum=2 minutes 15 seconds.  Withdrawal time=8 minutes 22 seconds.  The scope was withdrawn and the procedure completed.  COMPLICATIONS: There were no complications.  ENDOSCOPIC IMPRESSION: 1.   Semi-pedunculated polyp measuring 7 mm in the transverse colon; polypectomy performed  with a cold snare 2.   The colon was otherwise normal  RECOMMENDATIONS: 1.  Await pathology results 2.  Repeat colonoscopy in 5 years if polyp adenomatous; otherwise 10 years  eSigned:  Ladene Artist, MD, [facility] 10/11/2013 8:25 AM

## 2013-10-14 ENCOUNTER — Telehealth: Payer: Self-pay | Admitting: *Deleted

## 2013-10-14 NOTE — Telephone Encounter (Signed)
No answer, left message to call if questions or concerns. 

## 2013-10-19 ENCOUNTER — Encounter: Payer: Self-pay | Admitting: Gastroenterology

## 2013-12-18 NOTE — Telephone Encounter (Signed)
This encounter was created in error - please disregard.

## 2014-06-30 ENCOUNTER — Other Ambulatory Visit (INDEPENDENT_AMBULATORY_CARE_PROVIDER_SITE_OTHER): Payer: 59

## 2014-06-30 DIAGNOSIS — Z0000 Encounter for general adult medical examination without abnormal findings: Secondary | ICD-10-CM

## 2014-06-30 LAB — LIPID PANEL
CHOL/HDL RATIO: 4
Cholesterol: 207 mg/dL — ABNORMAL HIGH (ref 0–200)
HDL: 55.7 mg/dL (ref >39.00)
LDL CALC: 127 mg/dL — AB (ref 0–99)
NonHDL: 151.3
Triglycerides: 120 mg/dL (ref 0.0–149.0)
VLDL: 24 mg/dL (ref 0.0–40.0)

## 2014-06-30 LAB — CBC WITH DIFFERENTIAL/PLATELET
Basophils Absolute: 0 10*3/uL (ref 0.0–0.1)
Basophils Relative: 0.9 % (ref 0.0–3.0)
EOS ABS: 0.1 10*3/uL (ref 0.0–0.7)
Eosinophils Relative: 2.7 % (ref 0.0–5.0)
HEMATOCRIT: 43.5 % (ref 39.0–52.0)
Hemoglobin: 14.6 g/dL (ref 13.0–17.0)
LYMPHS ABS: 0.9 10*3/uL (ref 0.7–4.0)
Lymphocytes Relative: 24.6 % (ref 12.0–46.0)
MCHC: 33.5 g/dL (ref 30.0–36.0)
MCV: 83.4 fl (ref 78.0–100.0)
MONO ABS: 0.3 10*3/uL (ref 0.1–1.0)
Monocytes Relative: 9 % (ref 3.0–12.0)
Neutro Abs: 2.2 10*3/uL (ref 1.4–7.7)
Neutrophils Relative %: 62.8 % (ref 43.0–77.0)
Platelets: 194 10*3/uL (ref 150.0–400.0)
RBC: 5.21 Mil/uL (ref 4.22–5.81)
RDW: 13.7 % (ref 11.5–15.5)
WBC: 3.5 10*3/uL — ABNORMAL LOW (ref 4.0–10.5)

## 2014-06-30 LAB — BASIC METABOLIC PANEL
BUN: 16 mg/dL (ref 6–23)
CHLORIDE: 106 meq/L (ref 96–112)
CO2: 29 mEq/L (ref 19–32)
Calcium: 9.3 mg/dL (ref 8.4–10.5)
Creatinine, Ser: 0.91 mg/dL (ref 0.40–1.50)
GFR: 90.93 mL/min (ref >60.00)
Glucose, Bld: 97 mg/dL (ref 70–99)
Potassium: 4.2 mEq/L (ref 3.5–5.1)
SODIUM: 140 meq/L (ref 135–145)

## 2014-06-30 LAB — HEPATIC FUNCTION PANEL
ALK PHOS: 70 U/L (ref 39–117)
ALT: 17 U/L (ref 0–53)
AST: 15 U/L (ref 0–37)
Albumin: 4 g/dL (ref 3.5–5.2)
Bilirubin, Direct: 0.1 mg/dL (ref 0.0–0.3)
Total Bilirubin: 0.7 mg/dL (ref 0.2–1.2)
Total Protein: 6.7 g/dL (ref 6.0–8.3)

## 2014-06-30 LAB — POCT URINALYSIS DIPSTICK
Bilirubin, UA: NEGATIVE
Blood, UA: NEGATIVE
GLUCOSE UA: NEGATIVE
KETONES UA: NEGATIVE
Leukocytes, UA: NEGATIVE
Nitrite, UA: NEGATIVE
Protein, UA: NEGATIVE
SPEC GRAV UA: 1.02
Urobilinogen, UA: 0.2
pH, UA: 6.5

## 2014-06-30 LAB — TSH: TSH: 3.41 u[IU]/mL (ref 0.35–4.50)

## 2014-06-30 LAB — PSA: PSA: 0.84 ng/mL (ref 0.10–4.00)

## 2014-07-07 ENCOUNTER — Encounter: Payer: Self-pay | Admitting: Family Medicine

## 2014-07-07 ENCOUNTER — Ambulatory Visit (INDEPENDENT_AMBULATORY_CARE_PROVIDER_SITE_OTHER): Payer: 59 | Admitting: Family Medicine

## 2014-07-07 VITALS — BP 140/90 | HR 65 | Temp 98.2°F | Ht 71.0 in | Wt 181.1 lb

## 2014-07-07 DIAGNOSIS — Z0000 Encounter for general adult medical examination without abnormal findings: Secondary | ICD-10-CM

## 2014-07-07 DIAGNOSIS — R010 Benign and innocent cardiac murmurs: Secondary | ICD-10-CM | POA: Diagnosis not present

## 2014-07-07 DIAGNOSIS — I341 Nonrheumatic mitral (valve) prolapse: Secondary | ICD-10-CM

## 2014-07-07 DIAGNOSIS — R011 Cardiac murmur, unspecified: Secondary | ICD-10-CM

## 2014-07-07 NOTE — Progress Notes (Signed)
   Subjective:    Patient ID: Alan Mcguire, male    DOB: 11-17-1955, 59 y.o.   MRN: [id number]  HPI Alan Mcguire  is a 59 year old married male nonsmoker who comes in today for general physical examination  He's always been in excellent health he has no chronic health problems and takes no medication for anything except an aspirin tablet daily.  He has a history of a heart murmur we detected last year. He went to cardiology. They found a mitral valve prolapse on echo. 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 We'll also do a thorough skin exam  He gets routine eye care, dental care, colonoscopy July 2015 normal.   Review of Systems  Constitutional: Negative.   HENT: Negative.   Eyes: Negative.   Respiratory: Negative.   Cardiovascular: Negative.   Gastrointestinal: Negative.   Endocrine: Negative.   Genitourinary: Negative.   Musculoskeletal: Negative.   Skin: Negative.   Allergic/Immunologic: Negative.   Neurological: Negative.   Hematological: Negative.   Psychiatric/Behavioral: Negative.        Objective:   Physical Exam  Constitutional: He is oriented to person, place, and time. He appears well-developed and well-nourished.  HENT:  Head: Normocephalic and atraumatic.  Right Ear: External ear normal.  Left Ear: External ear normal.  Nose: Nose normal.  Mouth/Throat: Oropharynx is clear and moist.  Eyes: Conjunctivae and EOM are normal. Pupils are equal, round, and reactive to light.  Neck: Normal range of motion. Neck supple. No JVD present. No tracheal deviation present. No thyromegaly present.  Cardiovascular: Normal rate, regular rhythm and intact distal pulses.  Exam reveals no gallop and no friction rub.   Murmur heard. Grade 2-3 systolic ejection murmur heard best in the left apical area. Intensity of murmur unchanged since last year  Pulmonary/Chest:  Effort normal and breath sounds normal. No stridor. No respiratory distress. He has no wheezes. He has no rales. He exhibits no tenderness.  Abdominal: Soft. Bowel sounds are normal. He exhibits no distension and no mass. There is no tenderness. There is no rebound and no guarding.  Genitourinary: Rectum normal, prostate normal and penis normal. Guaiac negative stool. No penile tenderness.  Musculoskeletal: Normal range of motion. He exhibits no edema or tenderness.  Lymphadenopathy:    He has no cervical adenopathy.  Neurological: He is alert and oriented to person, place, and time. He has normal reflexes. No cranial nerve deficit. He exhibits normal muscle tone.  Skin: Skin is warm and dry. No rash noted. No erythema. No pallor.  Total body skin exam normal  Psychiatric: He has a normal mood and affect. His behavior is normal. Judgment and thought content normal.          Assessment & Plan:  Healthy male  Mitral valve prolapse,,,,,,,,, continued observation  Bilateral cerumen impaction,,,,,,,, relieved by suction and irrigation,

## 2014-07-07 NOTE — Patient Instructions (Signed)
Call make an appointment to see Dr. [name] in cardiology for follow-up although your murmur is unchanged  Return in one year for general physical exam sooner if any problems  Wear those sunscreens!!!!!!!!!!!!!!!!!!

## 2014-07-07 NOTE — Progress Notes (Signed)
Pre visit review using our clinic review tool, if applicable. No additional management support is needed unless otherwise documented below in the visit note. 

## 2015-10-27 ENCOUNTER — Ambulatory Visit (INDEPENDENT_AMBULATORY_CARE_PROVIDER_SITE_OTHER): Payer: Managed Care, Other (non HMO) | Admitting: Adult Health

## 2015-10-27 ENCOUNTER — Encounter: Payer: Self-pay | Admitting: Adult Health

## 2015-10-27 VITALS — BP 140/80 | Temp 98.6°F | Ht 71.0 in | Wt 174.8 lb

## 2015-10-27 DIAGNOSIS — N401 Enlarged prostate with lower urinary tract symptoms: Secondary | ICD-10-CM

## 2015-10-27 DIAGNOSIS — Z0000 Encounter for general adult medical examination without abnormal findings: Secondary | ICD-10-CM

## 2015-10-27 DIAGNOSIS — N138 Other obstructive and reflux uropathy: Secondary | ICD-10-CM

## 2015-10-27 LAB — CBC WITH DIFFERENTIAL/PLATELET
BASOS ABS: 0.1 10*3/uL (ref 0.0–0.1)
Basophils Relative: 1.3 % (ref 0.0–3.0)
EOS PCT: 1.7 % (ref 0.0–5.0)
Eosinophils Absolute: 0.1 10*3/uL (ref 0.0–0.7)
HCT: 46.1 % (ref 39.0–52.0)
HEMOGLOBIN: 15.6 g/dL (ref 13.0–17.0)
Lymphocytes Relative: 18.3 % (ref 12.0–46.0)
Lymphs Abs: 0.9 10*3/uL (ref 0.7–4.0)
MCHC: 33.8 g/dL (ref 30.0–36.0)
MCV: 84.3 fl (ref 78.0–100.0)
MONO ABS: 0.4 10*3/uL (ref 0.1–1.0)
Monocytes Relative: 8.6 % (ref 3.0–12.0)
Neutro Abs: 3.5 10*3/uL (ref 1.4–7.7)
Neutrophils Relative %: 70.1 % (ref 43.0–77.0)
Platelets: 197 10*3/uL (ref 150.0–400.0)
RBC: 5.48 Mil/uL (ref 4.22–5.81)
RDW: 13.6 % (ref 11.5–15.5)
WBC: 5 10*3/uL (ref 4.0–10.5)

## 2015-10-27 LAB — LIPID PANEL
CHOL/HDL RATIO: 4
Cholesterol: 244 mg/dL — ABNORMAL HIGH (ref 0–200)
HDL: 67.3 mg/dL (ref >39.00)
LDL Cholesterol: 158 mg/dL — ABNORMAL HIGH (ref 0–99)
NONHDL: 177.16
Triglycerides: 95 mg/dL (ref 0.0–149.0)
VLDL: 19 mg/dL (ref 0.0–40.0)

## 2015-10-27 LAB — BASIC METABOLIC PANEL
BUN: 18 mg/dL (ref 6–23)
CO2: 30 mEq/L (ref 19–32)
Calcium: 9.8 mg/dL (ref 8.4–10.5)
Chloride: 105 mEq/L (ref 96–112)
Creatinine, Ser: 1.08 mg/dL (ref 0.40–1.50)
GFR: 74.28 mL/min (ref >60.00)
GLUCOSE: 98 mg/dL (ref 70–99)
POTASSIUM: 4.1 meq/L (ref 3.5–5.1)
Sodium: 142 mEq/L (ref 135–145)

## 2015-10-27 LAB — HEPATIC FUNCTION PANEL
ALK PHOS: 72 U/L (ref 39–117)
ALT: 14 U/L (ref 0–53)
AST: 14 U/L (ref 0–37)
Albumin: 4.5 g/dL (ref 3.5–5.2)
Bilirubin, Direct: 0.1 mg/dL (ref 0.0–0.3)
Total Bilirubin: 0.7 mg/dL (ref 0.2–1.2)
Total Protein: 7.2 g/dL (ref 6.0–8.3)

## 2015-10-27 LAB — TSH: TSH: 3.82 u[IU]/mL (ref 0.35–4.50)

## 2015-10-27 NOTE — Progress Notes (Signed)
Patient presents to clinic today to establish care. He is a pleasant 60 year old male who  has a past medical history of Lumbar disc disease; Murmur (9/92011); and MVP (mitral valve prolapse).   Acute Concerns: Complete Physical    Chronic Issues: Decreased urinary stream  - He feels that in the morning that he does not have a strong stream. He has no UTI type symptoms and denies any nocturia.   Health Maintenance: Dental -- Goes for routine visits  Vision -- Goes for routine visits.  Immunizations --UTD  Colonoscopy -- 2015 - Normal  Diet: Eats healthy  Exercise: Does not have a routine exercise regimen but does stay active   Is Followed by: - Cardiology - Dr. [name]   Past Medical History:  Diagnosis Date  . Lumbar disc disease    L4-5  . Murmur 9/92011   echo-MVP posterior leaflet with moderate mitral insufficiency and the jet directed anteriorly,left atrium mildly enlarged  . MVP (mitral valve prolapse)     Past Surgical History:  Procedure Laterality Date  . COLONOSCOPY    . VASECTOMY    . WISDOM TOOTH EXTRACTION      Current Outpatient Prescriptions on File Prior to Visit  Medication Sig Dispense Refill  . aspirin EC 81 MG tablet Take 81 mg by mouth daily.    . Multiple Vitamin (MULTIVITAMIN) capsule Take 1 capsule by mouth daily.     No current facility-administered medications on file prior to visit.     Allergies  Allergen Reactions  . Other     Bee stings    Family History  Problem Relation Age of Onset  . Kidney failure Father     died  . Hypertension Mother   . Lung cancer Mother   . Colon cancer Neg Hx     Social History   Social History  . Marital status: Married    Spouse name: N/A  . Number of children: N/A  . Years of education: N/A   Occupational History  . Not on file.   Social History Main Topics  . Smoking status: Never Smoker  . Smokeless tobacco: Never Used  . Alcohol use No  . Drug use: No  . Sexual activity:  Not on file   Other Topics Concern  . Not on file   Social History Narrative   Materials engineer company    Married    4 kids ( 2 in college and 2 married)    He likes to play golf and work in the yard.    He coaches swimming    Review of Systems  Constitutional: Negative.   HENT: Negative.   Eyes: Negative.   Respiratory: Negative.   Cardiovascular: Negative.   Gastrointestinal: Negative.   Genitourinary: Negative.   Skin: Negative.   All other systems reviewed and are negative.   BP 140/80   Temp 98.6 F (37 C) (Oral)   Ht 5\' 11"  (1.803 m)   Wt 174 lb 12.8 oz (79.3 kg)   BMI 24.38 kg/m   Physical Exam  Constitutional: He is oriented to person, place, and time and well-developed, well-nourished, and in no distress. No distress.  HENT:  Head: Normocephalic and atraumatic.  Right Ear: External ear normal.  Left Ear: External ear normal.  Nose: Nose normal.  Mouth/Throat: Oropharynx is clear and moist. No oropharyngeal exudate.  Eyes: Conjunctivae and EOM are normal. Right eye exhibits no discharge. Left eye exhibits no discharge. No scleral  icterus.  Neck: Normal range of motion. Neck supple. No JVD present. No tracheal deviation present. No thyromegaly present.  Cardiovascular: Normal rate, regular rhythm and intact distal pulses.  Exam reveals no gallop and no friction rub.   Murmur heard. Pulmonary/Chest: Effort normal and breath sounds normal. No stridor. No respiratory distress. He has no wheezes. He has no rales. He exhibits no tenderness.  Genitourinary: Rectum normal. Rectal exam shows no internal hemorrhoid, no laceration, no tenderness and guaiac negative stool. Prostate is enlarged. Prostate is not tender. No discharge found.  Musculoskeletal: Normal range of motion. He exhibits no edema, tenderness or deformity.  Lymphadenopathy:    He has no cervical adenopathy.  Neurological: He is alert and oriented to person, place, and time. He has normal  reflexes. He displays normal reflexes. No cranial nerve deficit. He exhibits normal muscle tone. Gait normal. Coordination normal. GCS score is 15.  Skin: Skin is warm and dry. No rash noted. He is not diaphoretic. No erythema. No pallor.  Psychiatric: Mood, memory, affect and judgment normal.  Nursing note and vitals reviewed.  Assessment/Plan:  1. Routine general medical examination at a health care facility - POCT Urinalysis Dipstick (Automated) - Basic metabolic panel - Lipid panel - CBC with Differential/Platelet - Hepatic function panel - TSH - Hep C Antibody - Continue to eat healthy and exercise - Follow up in one year or sooner if needed 2. BPH (benign prostatic hypertrophy) with urinary obstruction - Will trial Saw Palmetto - Follow up if no improvement - Consider Proscar or Flomax  Dorothyann Peng, NP

## 2015-10-27 NOTE — Patient Instructions (Addendum)
It was great meeting you to day   I will follow up with you regarding your blood work   I will see you in a year for your next physical. If you need anything in the meantime, please let me know   Health Maintenance, Male A healthy lifestyle and preventative care can promote health and wellness.  Maintain regular health, dental, and eye exams.  Eat a healthy diet. Foods like vegetables, fruits, whole grains, low-fat dairy products, and lean protein foods contain the nutrients you need and are low in calories. Decrease your intake of foods high in solid fats, added sugars, and salt. Get information about a proper diet from your health care provider, if necessary.  Regular physical exercise is one of the most important things you can do for your health. Most adults should get at least 150 minutes of moderate-intensity exercise (any activity that increases your heart rate and causes you to sweat) each week. In addition, most adults need muscle-strengthening exercises on 2 or more days a week.   Maintain a healthy weight. The body mass index (BMI) is a screening tool to identify possible weight problems. It provides an estimate of body fat based on height and weight. Your health care provider can find your BMI and can help you achieve or maintain a healthy weight. For males 20 years and older:  A BMI below 18.5 is considered underweight.  A BMI of 18.5 to 24.9 is normal.  A BMI of 25 to 29.9 is considered overweight.  A BMI of 30 and above is considered obese.  Maintain normal blood lipids and cholesterol by exercising and minimizing your intake of saturated fat. Eat a balanced diet with plenty of fruits and vegetables. Blood tests for lipids and cholesterol should begin at age 34 and be repeated every 5 years. If your lipid or cholesterol levels are high, you are over age 10, or you are at high risk for heart disease, you may need your cholesterol levels checked more frequently.Ongoing high  lipid and cholesterol levels should be treated with medicines if diet and exercise are not working.  If you smoke, find out from your health care provider how to quit. If you do not use tobacco, do not start.  Lung cancer screening is recommended for adults aged 73-80 years who are at high risk for developing lung cancer because of a history of smoking. A yearly low-dose CT scan of the lungs is recommended for people who have at least a 30-pack-year history of smoking and are current smokers or have quit within the past 15 years. A pack year of smoking is smoking an average of 1 pack of cigarettes a day for 1 year (for example, a 30-pack-year history of smoking could mean smoking 1 pack a day for 30 years or 2 packs a day for 15 years). Yearly screening should continue until the smoker has stopped smoking for at least 15 years. Yearly screening should be stopped for people who develop a health problem that would prevent them from having lung cancer treatment.  If you choose to drink alcohol, do not have more than 2 drinks per day. One drink is considered to be 12 oz (360 mL) of beer, 5 oz (150 mL) of wine, or 1.5 oz (45 mL) of liquor.  Avoid the use of street drugs. Do not share needles with anyone. Ask for help if you need support or instructions about stopping the use of drugs.  High blood pressure causes heart disease  and increases the risk of stroke. High blood pressure is more likely to develop in:  People who have blood pressure in the end of the normal range (100-139/85-89 mm Hg).  People who are overweight or obese.  People who are African American.  If you are 79-24 years of age, have your blood pressure checked every 3-5 years. If you are 75 years of age or older, have your blood pressure checked every year. You should have your blood pressure measured twice--once when you are at a hospital or clinic, and once when you are not at a hospital or clinic. Record the average of the two  measurements. To check your blood pressure when you are not at a hospital or clinic, you can use:  An automated blood pressure machine at a pharmacy.  A home blood pressure monitor.  If you are 73-66 years old, ask your health care provider if you should take aspirin to prevent heart disease.  Diabetes screening involves taking a blood sample to check your fasting blood sugar level. This should be done once every 3 years after age 75 if you are at a normal weight and without risk factors for diabetes. Testing should be considered at a younger age or be carried out more frequently if you are overweight and have at least 1 risk factor for diabetes.  Colorectal cancer can be detected and often prevented. Most routine colorectal cancer screening begins at the age of 51 and continues through age 52. However, your health care provider may recommend screening at an earlier age if you have risk factors for colon cancer. On a yearly basis, your health care provider may provide home test kits to check for hidden blood in the stool. A small camera at the end of a tube may be used to directly examine the colon (sigmoidoscopy or colonoscopy) to detect the earliest forms of colorectal cancer. Talk to your health care provider about this at age 19 when routine screening begins. A direct exam of the colon should be repeated every 5-10 years through age 21, unless early forms of precancerous polyps or small growths are found.  People who are at an increased risk for hepatitis B should be screened for this virus. You are considered at high risk for hepatitis B if:  You were born in a country where hepatitis B occurs often. Talk with your health care provider about which countries are considered high risk.  Your parents were born in a high-risk country and you have not received a shot to protect against hepatitis B (hepatitis B vaccine).  You have HIV or AIDS.  You use needles to inject street drugs.  You live  with, or have sex with, someone who has hepatitis B.  You are a man who has sex with other men (MSM).  You get hemodialysis treatment.  You take certain medicines for conditions like cancer, organ transplantation, and autoimmune conditions.  Hepatitis C blood testing is recommended for all people born from [birth year] through 1965 and any individual with known risk factors for hepatitis C.  Healthy men should no longer receive prostate-specific antigen (PSA) blood tests as part of routine cancer screening. Talk to your health care provider about prostate cancer screening.  Testicular cancer screening is not recommended for adolescents or adult males who have no symptoms. Screening includes self-exam, a health care provider exam, and other screening tests. Consult with your health care provider about any symptoms you have or any concerns you have about testicular cancer.  Practice safe sex. Use condoms and avoid high-risk sexual practices to reduce the spread of sexually transmitted infections (STIs).  You should be screened for STIs, including gonorrhea and chlamydia if:  You are sexually active and are younger than 24 years.  You are older than 24 years, and your health care provider tells you that you are at risk for this type of infection.  Your sexual activity has changed since you were last screened, and you are at an increased risk for chlamydia or gonorrhea. Ask your health care provider if you are at risk.  If you are at risk of being infected with HIV, it is recommended that you take a prescription medicine daily to prevent HIV infection. This is called pre-exposure prophylaxis (PrEP). You are considered at risk if:  You are a man who has sex with other men (MSM).  You are a heterosexual man who is sexually active with multiple partners.  You take drugs by injection.  You are sexually active with a partner who has HIV.  Talk with your health care provider about whether you are at  high risk of being infected with HIV. If you choose to begin PrEP, you should first be tested for HIV. You should then be tested every 3 months for as long as you are taking PrEP.  Use sunscreen. Apply sunscreen liberally and repeatedly throughout the day. You should seek shade when your shadow is shorter than you. Protect yourself by wearing long sleeves, pants, a wide-brimmed hat, and sunglasses year round whenever you are outdoors.  Tell your health care provider of new moles or changes in moles, especially if there is a change in shape or color. Also, tell your health care provider if a mole is larger than the size of a pencil eraser.  A one-time screening for abdominal aortic aneurysm (AAA) and surgical repair of large AAAs by ultrasound is recommended for men aged 8-75 years who are current or former smokers.  Stay current with your vaccines (immunizations).   This information is not intended to replace advice given to you by your health care provider. Make sure you discuss any questions you have with your health care provider.   Document Released: 08/27/2007 Document Revised: 03/21/2014 Document Reviewed: 07/26/2010 Elsevier Interactive Patient Education [employer].

## 2015-10-28 LAB — HEPATITIS C ANTIBODY: HCV Ab: NEGATIVE

## 2016-06-27 ENCOUNTER — Telehealth: Payer: Self-pay | Admitting: Adult Health

## 2016-06-27 NOTE — Telephone Encounter (Signed)
Pt need a referral to see dr [name] for MVP. Pt last seen dr Marchelle Gearing 2014.  Pt has [employer]

## 2016-06-28 ENCOUNTER — Encounter: Payer: Self-pay | Admitting: Adult Health

## 2016-06-28 ENCOUNTER — Other Ambulatory Visit: Payer: Self-pay | Admitting: Adult Health

## 2016-06-28 ENCOUNTER — Ambulatory Visit (INDEPENDENT_AMBULATORY_CARE_PROVIDER_SITE_OTHER): Payer: Managed Care, Other (non HMO) | Admitting: Adult Health

## 2016-06-28 VITALS — BP 138/78 | HR 95 | Temp 98.4°F | Ht 71.0 in | Wt 182.0 lb

## 2016-06-28 DIAGNOSIS — M545 Low back pain, unspecified: Secondary | ICD-10-CM

## 2016-06-28 DIAGNOSIS — I341 Nonrheumatic mitral (valve) prolapse: Secondary | ICD-10-CM

## 2016-06-28 LAB — POCT URINALYSIS DIPSTICK
Bilirubin, UA: NEGATIVE
Blood, UA: NEGATIVE
Glucose, UA: NEGATIVE
Ketones, UA: NEGATIVE
LEUKOCYTES UA: NEGATIVE
Nitrite, UA: NEGATIVE
PH UA: 6 (ref 5.0–8.0)
PROTEIN UA: NEGATIVE
Spec Grav, UA: >=1.03 — AB (ref 1.010–1.025)
Urobilinogen, UA: 0.2 E.U./dL

## 2016-06-28 MED ORDER — CYCLOBENZAPRINE HCL 10 MG PO TABS: 10.0000 mg | ORAL_TABLET | Freq: Three times a day (TID) | ORAL | 0 refills | Status: DC | PRN

## 2016-06-28 MED ORDER — METHYLPREDNISOLONE 4 MG PO TBPK: ORAL_TABLET | ORAL | 0 refills | Status: DC

## 2016-06-28 NOTE — Telephone Encounter (Signed)
Pt is requesting referral to see cardiology, Dr. [name].  Cory - Please advise. Thanks!

## 2016-06-28 NOTE — Telephone Encounter (Signed)
Referral placed.

## 2016-06-28 NOTE — Progress Notes (Signed)
Subjective:    Patient ID: Alan Mcguire, male    DOB: 1955/10/15, 61 y.o.   MRN: [id number]  HPI  61 year old male who presents to the clinic today for the complaint of low back pain. He reports that he first noticed this pain in his lower back about 3 weeks ago, since that time he has noticed that the pain has radiated across his abdomen. He denies any UTI symptoms such as hematuria, dysuria, frequency or urgency. He does think he is urinating less frequent. Pain is described as " dull "   Pain is worse with movement such as twisting or bending.   Review of Systems See HPI   Past Medical History:  Diagnosis Date  . Lumbar disc disease    L4-5  . Murmur 9/92011   echo-MVP posterior leaflet with moderate mitral insufficiency and the jet directed anteriorly,left atrium mildly enlarged  . MVP (mitral valve prolapse)     Social History   Social History  . Marital status: Married    Spouse name: N/A  . Number of children: N/A  . Years of education: N/A   Occupational History  . Not on file.   Social History Main Topics  . Smoking status: Never Smoker  . Smokeless tobacco: Never Used  . Alcohol use No  . Drug use: No  . Sexual activity: Not on file   Other Topics Concern  . Not on file   Social History Narrative   Materials engineer company    Married    4 kids ( 2 in college and 2 married)    He likes to play golf and work in the yard.    He coaches swimming    Past Surgical History:  Procedure Laterality Date  . COLONOSCOPY    . VASECTOMY    . WISDOM TOOTH EXTRACTION      Family History  Problem Relation Age of Onset  . Kidney failure Father     died  . Hypertension Mother   . Lung cancer Mother   . Colon cancer Neg Hx     Allergies  Allergen Reactions  . Other     Bee stings    Current Outpatient Prescriptions on File Prior to Visit  Medication Sig Dispense Refill  . aspirin EC 81 MG tablet Take 81 mg by mouth daily.    . Multiple  Vitamin (MULTIVITAMIN) capsule Take 1 capsule by mouth daily.     No current facility-administered medications on file prior to visit.     BP 138/78 (BP Location: Left Arm, Patient Position: Sitting, Cuff Size: Large)   Pulse 95   Temp 98.4 F (36.9 C) (Oral)   Ht 5\' 11"  (1.803 m)   Wt 182 lb (82.6 kg)   SpO2 93%   BMI 25.38 kg/m       Objective:   Physical Exam  Constitutional: He is oriented to person, place, and time. He appears well-developed and well-nourished. No distress.  Cardiovascular: Normal rate, regular rhythm and intact distal pulses.  Exam reveals no gallop and no friction rub.   Murmur (heard best at sternal boarder and apex ) heard. Pulmonary/Chest: Effort normal and breath sounds normal. No respiratory distress. He has no wheezes. He has no rales. He exhibits no tenderness.  Abdominal: Soft. Normal appearance and bowel sounds are normal. He exhibits no distension and no mass. There is no hepatosplenomegaly, splenomegaly or hepatomegaly. There is no tenderness. There is no rigidity, no rebound,  no guarding, no CVA tenderness, no tenderness at McBurney's point and negative Murphy's sign.  Musculoskeletal: He exhibits tenderness (tenderness with palpation along left lower back ). He exhibits no edema or deformity.  Neurological: He is alert and oriented to person, place, and time.  Skin: Skin is warm and dry. No rash noted. He is not diaphoretic. No erythema. No pallor.  Psychiatric: He has a normal mood and affect. His behavior is normal. Judgment and thought content normal.  Nursing note and vitals reviewed.     Assessment & Plan:  1. Acute left-sided low back pain without sciatica - Appears to be MSK in nature.  - methylPREDNISolone (MEDROL DOSEPAK) 4 MG TBPK tablet; Take as directed  Dispense: 21 tablet; Refill: 0 - cyclobenzaprine (FLEXERIL) 10 MG tablet; Take 1 tablet (10 mg total) by mouth 3 (three) times daily as needed for muscle spasms.  Dispense: 30  tablet; Refill: 0 - POC Urinalysis Dipstick- Negative  - Motrin and a heating pad  - Follow up if no improvement  Dorothyann Peng, NP

## 2016-06-28 NOTE — Progress Notes (Signed)
Pre visit review using our clinic review tool, if applicable. No additional management support is needed unless otherwise documented below in the visit note. 

## 2016-07-13 ENCOUNTER — Encounter: Payer: Self-pay | Admitting: *Deleted

## 2016-08-01 ENCOUNTER — Ambulatory Visit: Payer: Managed Care, Other (non HMO) | Admitting: Cardiology

## 2016-08-24 NOTE — Progress Notes (Signed)
Alan Mcguire Date of Birth: [birth year] Medical Record #[id number]  History of Present Illness: Alan Mcguire is seen at the request of Dr. Carlisle Cater for followup of mitral valve prolapse. He was last seen in May 2014. He has a history of mitral valve prolapse with moderate mitral insufficiency. His last echocardiogram was in March of 2013.   He works in the [employer]. He states he is doing very well without any new health complaints over the last 4 years. He denies any dyspnea, palpitations, or chest pain. He remains active and very exercises regularly. He does note some fluttering sensation when lying down in the evening that lasts less than 20-30 seconds. His energy level is good.   Current Outpatient Prescriptions on File Prior to Visit  Medication Sig Dispense Refill  . aspirin EC 81 MG tablet Take 81 mg by mouth daily.    . cyclobenzaprine (FLEXERIL) 10 MG tablet Take 1 tablet (10 mg total) by mouth 3 (three) times daily as needed for muscle spasms. 30 tablet 0  . Multiple Vitamin (MULTIVITAMIN) capsule Take 1 capsule by mouth daily.    . methylPREDNISolone (MEDROL DOSEPAK) 4 MG TBPK tablet Take as directed (Patient not taking: Reported on 08/25/2016) 21 tablet 0   No current facility-administered medications on file prior to visit.     Allergies  Allergen Reactions  . Other     Bee stings    Past Medical History:  Diagnosis Date  . Lumbar disc disease    L4-5  . Murmur 9/92011   echo-MVP posterior leaflet with moderate mitral insufficiency and the jet directed anteriorly,left atrium mildly enlarged  . MVP (mitral valve prolapse)     Past Surgical History:  Procedure Laterality Date  . COLONOSCOPY    . VASECTOMY    . WISDOM TOOTH EXTRACTION      History  Smoking Status  . Never Smoker  Smokeless Tobacco  . Never Used    History  Alcohol Use No    Family History  Problem Relation Age of Onset  . Kidney failure Father        died  . Kidney  disease Father   . Hypertension Mother   . Lung cancer Mother   . Colon cancer Neg Hx     Review of Systems: As noted in history of present illness .  All other systems were reviewed and are negative.  Physical Exam: BP 118/64   Pulse 77   Ht 5\' 10"  (1.778 m)   Wt 169 lb 6.4 oz (76.8 kg)   BMI 24.31 kg/m   he is a well-developed white male in no acute distress. His HEENT exam is unremarkable. He has no JVD or bruits. Lungs are clear. Cardiac exam reveals a regular rate and rhythm with a grade 3/6 late peaking midsystolic murmur at the apex.  There is no diastolic murmur or S3. Abdomen is soft and nontender without masses or bruits. Extremities are without edema. Pulses are 2+ and symmetric.   LABORATORY DATA:  Lab Results  Component Value Date   WBC 5.0 10/27/2015   HGB 15.6 10/27/2015   HCT 46.1 10/27/2015   PLT 197.0 10/27/2015   GLUCOSE 98 10/27/2015   CHOL 244 (H) 10/27/2015   TRIG 95.0 10/27/2015   HDL 67.30 10/27/2015   LDLDIRECT 123.4 06/20/2011   LDLCALC 158 (H) 10/27/2015   ALT 14 10/27/2015   AST 14 10/27/2015   NA 142 10/27/2015   K 4.1 10/27/2015   CL  105 10/27/2015   CREATININE 1.08 10/27/2015   BUN 18 10/27/2015   CO2 30 10/27/2015   TSH 3.82 10/27/2015   PSA 0.84 06/30/2014    ECG today shows normal sinus rhythm. Rate 77. Minimal voltage criteria for LVH. It is otherwise normal. This is unchanged from older Ecgs. I have personally reviewed and interpreted this study.   Assessment / Plan: 1. Mitral valve prolapse with moderate mitral insufficiency by prior evaluation. He is asymptomatic except for transient fluttering sensation. We will arrange for follow up Echo to reassess valve. Further recommendations pending these results.

## 2016-08-25 ENCOUNTER — Ambulatory Visit (INDEPENDENT_AMBULATORY_CARE_PROVIDER_SITE_OTHER): Payer: Managed Care, Other (non HMO) | Admitting: Cardiology

## 2016-08-25 ENCOUNTER — Encounter: Payer: Self-pay | Admitting: Cardiology

## 2016-08-25 VITALS — BP 118/64 | HR 77 | Ht 70.0 in | Wt 169.4 lb

## 2016-08-25 DIAGNOSIS — I341 Nonrheumatic mitral (valve) prolapse: Secondary | ICD-10-CM | POA: Diagnosis not present

## 2016-08-25 DIAGNOSIS — R011 Cardiac murmur, unspecified: Secondary | ICD-10-CM

## 2016-08-25 NOTE — Patient Instructions (Addendum)
We will schedule you for an Echocardiogram   

## 2016-09-08 ENCOUNTER — Other Ambulatory Visit: Payer: Self-pay

## 2016-09-08 ENCOUNTER — Ambulatory Visit (HOSPITAL_COMMUNITY): Payer: Managed Care, Other (non HMO) | Attending: Cardiology

## 2016-09-08 DIAGNOSIS — I272 Pulmonary hypertension, unspecified: Secondary | ICD-10-CM | POA: Diagnosis not present

## 2016-09-08 DIAGNOSIS — I341 Nonrheumatic mitral (valve) prolapse: Secondary | ICD-10-CM

## 2016-09-08 DIAGNOSIS — R011 Cardiac murmur, unspecified: Secondary | ICD-10-CM | POA: Insufficient documentation

## 2016-09-08 DIAGNOSIS — I083 Combined rheumatic disorders of mitral, aortic and tricuspid valves: Secondary | ICD-10-CM | POA: Insufficient documentation

## 2016-09-16 ENCOUNTER — Telehealth: Payer: Self-pay | Admitting: Cardiology

## 2016-09-16 ENCOUNTER — Other Ambulatory Visit: Payer: Self-pay | Admitting: Cardiology

## 2016-09-16 DIAGNOSIS — I340 Nonrheumatic mitral (valve) insufficiency: Secondary | ICD-10-CM

## 2016-09-16 NOTE — Telephone Encounter (Signed)
Returned call to patient.TEE scheduled 09/21/16 at 11:00 am with Dr.[place],arrive at 9:30 am.Instructions reviewed with patient,he voiced understanding.Follow up appointment scheduled with Dr.Jordan 09/28/16 at 10:40 am.

## 2016-09-16 NOTE — Telephone Encounter (Signed)
New message ° ° °Pt calling Cheryl back °

## 2016-09-21 ENCOUNTER — Ambulatory Visit (HOSPITAL_COMMUNITY)
Admission: RE | Admit: 2016-09-21 | Discharge: 2016-09-21 | Disposition: A | Discharge status: Home / Self Care | Payer: Managed Care, Other (non HMO) | Source: Ambulatory Visit | Attending: Cardiovascular Disease | Admitting: Cardiovascular Disease

## 2016-09-21 ENCOUNTER — Ambulatory Visit (HOSPITAL_BASED_OUTPATIENT_CLINIC_OR_DEPARTMENT_OTHER)
Admission: RE | Admit: 2016-09-21 | Discharge: 2016-09-21 | Disposition: A | Discharge status: Home / Self Care | Payer: Managed Care, Other (non HMO) | Source: Ambulatory Visit | Attending: Cardiology | Admitting: Cardiology

## 2016-09-21 ENCOUNTER — Encounter (HOSPITAL_COMMUNITY)
Admission: RE | Disposition: A | Discharge status: Home / Self Care | Payer: Self-pay | Source: Ambulatory Visit | Attending: Cardiovascular Disease

## 2016-09-21 ENCOUNTER — Encounter (HOSPITAL_COMMUNITY): Payer: Self-pay | Admitting: Cardiovascular Disease

## 2016-09-21 DIAGNOSIS — Z79899 Other long term (current) drug therapy: Secondary | ICD-10-CM | POA: Diagnosis not present

## 2016-09-21 DIAGNOSIS — I340 Nonrheumatic mitral (valve) insufficiency: Secondary | ICD-10-CM | POA: Diagnosis not present

## 2016-09-21 DIAGNOSIS — Z7982 Long term (current) use of aspirin: Secondary | ICD-10-CM | POA: Insufficient documentation

## 2016-09-21 DIAGNOSIS — I080 Rheumatic disorders of both mitral and aortic valves: Secondary | ICD-10-CM | POA: Insufficient documentation

## 2016-09-21 HISTORY — PX: TEE WITHOUT CARDIOVERSION: SHX5443

## 2016-09-21 SURGERY — ECHOCARDIOGRAM, TRANSESOPHAGEAL
Anesthesia: Moderate Sedation

## 2016-09-21 MED ORDER — BUTAMBEN-TETRACAINE-BENZOCAINE 2-2-14 % EX AERO
INHALATION_SPRAY | CUTANEOUS | Status: DC | PRN
  Administered 2016-09-21: 2 via TOPICAL

## 2016-09-21 MED ORDER — MIDAZOLAM HCL 5 MG/ML IJ SOLN
INTRAMUSCULAR | Status: AC
  Filled 2016-09-21: qty 2

## 2016-09-21 MED ORDER — MIDAZOLAM HCL 10 MG/2ML IJ SOLN
INTRAMUSCULAR | Status: DC | PRN
  Administered 2016-09-21: 2 mg via INTRAVENOUS
  Administered 2016-09-21: 1 mg via INTRAVENOUS
  Administered 2016-09-21: 2 mg via INTRAVENOUS

## 2016-09-21 MED ORDER — FENTANYL CITRATE (PF) 100 MCG/2ML IJ SOLN
INTRAMUSCULAR | Status: AC
  Filled 2016-09-21: qty 2

## 2016-09-21 MED ORDER — FENTANYL CITRATE (PF) 100 MCG/2ML IJ SOLN
INTRAMUSCULAR | Status: DC | PRN
  Administered 2016-09-21 (×2): 25 ug via INTRAVENOUS

## 2016-09-21 MED ORDER — SODIUM CHLORIDE 0.9 % IV SOLN
INTRAVENOUS | Status: DC
  Administered 2016-09-21: 500 mL via INTRAVENOUS

## 2016-09-21 NOTE — CV Procedure (Signed)
Brief TEE Note  LVEF 60-65% Mitral valve leaflets are thickened and myxomatous  Severe prolapse of the posterior mitral valve leaflet Moderate prolapse of the anterior mitral valve leaflet Severe, eccentric mitral valve regurgitation in mid to late systole.  Coanda effect was noted and there was mild systolic flow reversal in the LUPV Mild TR and AR Trivial PR No LA/LAA thrombus or mass LA and RA enlargement   During this procedure the patient is administered a total of Versed 5 mg and Fentanyl 50 mcg to achieve and maintain moderate conscious sedation.  The patient's heart rate, blood pressure, and oxygen saturation are monitored continuously during the procedure. The period of conscious sedation is 26 minutes, of which I was present face-to-face 100% of this time.   For additional details see full report.  Alan Monk C. Oval Linsey, MD, [facility] 09/21/2016 11:36 AM

## 2016-09-21 NOTE — H&P (View-Only) (Signed)
Alan Mcguire Date of Birth: 1955/10/10 Medical Record #[id number]  History of Present Illness: Alan Mcguire is seen at the request of Dr. Carlisle Cater for followup of mitral valve prolapse. He was last seen in May 2014. He has a history of mitral valve prolapse with moderate mitral insufficiency. His last echocardiogram was in March of 2013.   He works in the [employer]. He states he is doing very well without any new health complaints over the last 4 years. He denies any dyspnea, palpitations, or chest pain. He remains active and very exercises regularly. He does note some fluttering sensation when lying down in the evening that lasts less than 20-30 seconds. His energy level is good.   Current Outpatient Prescriptions on File Prior to Visit  Medication Sig Dispense Refill  . aspirin EC 81 MG tablet Take 81 mg by mouth daily.    . cyclobenzaprine (FLEXERIL) 10 MG tablet Take 1 tablet (10 mg total) by mouth 3 (three) times daily as needed for muscle spasms. 30 tablet 0  . Multiple Vitamin (MULTIVITAMIN) capsule Take 1 capsule by mouth daily.    . methylPREDNISolone (MEDROL DOSEPAK) 4 MG TBPK tablet Take as directed (Patient not taking: Reported on 08/25/2016) 21 tablet 0   No current facility-administered medications on file prior to visit.     Allergies  Allergen Reactions  . Other     Bee stings    Past Medical History:  Diagnosis Date  . Lumbar disc disease    L4-5  . Murmur 9/92011   echo-MVP posterior leaflet with moderate mitral insufficiency and the jet directed anteriorly,left atrium mildly enlarged  . MVP (mitral valve prolapse)     Past Surgical History:  Procedure Laterality Date  . COLONOSCOPY    . VASECTOMY    . WISDOM TOOTH EXTRACTION      History  Smoking Status  . Never Smoker  Smokeless Tobacco  . Never Used    History  Alcohol Use No    Family History  Problem Relation Age of Onset  . Kidney failure Father        died  . Kidney  disease Father   . Hypertension Mother   . Lung cancer Mother   . Colon cancer Neg Hx     Review of Systems: As noted in history of present illness .  All other systems were reviewed and are negative.  Physical Exam: BP 118/64   Pulse 77   Ht 5\' 10"  (1.778 m)   Wt 169 lb 6.4 oz (76.8 kg)   BMI 24.31 kg/m   he is a well-developed white male in no acute distress. His HEENT exam is unremarkable. He has no JVD or bruits. Lungs are clear. Cardiac exam reveals a regular rate and rhythm with a grade 3/6 late peaking midsystolic murmur at the apex.  There is no diastolic murmur or S3. Abdomen is soft and nontender without masses or bruits. Extremities are without edema. Pulses are 2+ and symmetric.   LABORATORY DATA:  Lab Results  Component Value Date   WBC 5.0 10/27/2015   HGB 15.6 10/27/2015   HCT 46.1 10/27/2015   PLT 197.0 10/27/2015   GLUCOSE 98 10/27/2015   CHOL 244 (H) 10/27/2015   TRIG 95.0 10/27/2015   HDL 67.30 10/27/2015   LDLDIRECT 123.4 06/20/2011   LDLCALC 158 (H) 10/27/2015   ALT 14 10/27/2015   AST 14 10/27/2015   NA 142 10/27/2015   K 4.1 10/27/2015   CL  105 10/27/2015   CREATININE 1.08 10/27/2015   BUN 18 10/27/2015   CO2 30 10/27/2015   TSH 3.82 10/27/2015   PSA 0.84 06/30/2014    ECG today shows normal sinus rhythm. Rate 77. Minimal voltage criteria for LVH. It is otherwise normal. This is unchanged from older Ecgs. I have personally reviewed and interpreted this study.   Assessment / Plan: 1. Mitral valve prolapse with moderate mitral insufficiency by prior evaluation. He is asymptomatic except for transient fluttering sensation. We will arrange for follow up Echo to reassess valve. Further recommendations pending these results.

## 2016-09-21 NOTE — Interval H&P Note (Signed)
History and Physical Interval Note:  09/21/2016 11:02 AM  Alan Mcguire  has presented today for surgery, with the diagnosis of SEVERE MITRAL REGURGITATION  The various methods of treatment have been discussed with the patient and family. After consideration of risks, benefits and other options for treatment, the patient has consented to  Procedure(s): TRANSESOPHAGEAL ECHOCARDIOGRAM (TEE) (N/A) as a surgical intervention .  The patient's history has been reviewed, patient examined, no change in status, stable for surgery.  I have reviewed the patient's chart and labs.  Questions were answered to the patient's satisfaction.     Skeet Latch, MD

## 2016-09-21 NOTE — Discharge Instructions (Signed)

## 2016-09-24 NOTE — Progress Notes (Signed)
Alan Mcguire Date of Birth: 08-28-1955 Medical Record #[id number]  History of Present Illness: Alan Mcguire is seen  for followup of mitral valve prolapse s/p recent TEE.  He has a history of mitral valve prolapse with moderate mitral insufficiency. When seen recently exam was concerning for worsening murmur. Echo was done suggesting severe MR. TEE was then performed as noted below.    He works in the [employer]. He states he is doing very well. He denies any dyspnea, palpitations, or chest pain. He remains active and very exercises regularly. He does note some fluttering sensation when lying down in the evening that lasts less than 20-30 seconds. His energy level is good.   Current Outpatient Prescriptions on File Prior to Visit  Medication Sig Dispense Refill  . aspirin EC 81 MG tablet Take 81 mg by mouth daily.    . cyclobenzaprine (FLEXERIL) 10 MG tablet Take 1 tablet (10 mg total) by mouth 3 (three) times daily as needed for muscle spasms. 30 tablet 0  . methylPREDNISolone (MEDROL DOSEPAK) 4 MG TBPK tablet Take as directed 21 tablet 0  . Multiple Vitamin (MULTIVITAMIN) capsule Take 1 capsule by mouth daily.    . Saw Palmetto, Serenoa repens, (SAW PALMETTO PO) Take 1 tablet by mouth daily.     No current facility-administered medications on file prior to visit.     Allergies  Allergen Reactions  . Other     Bee stings    Past Medical History:  Diagnosis Date  . Lumbar disc disease    L4-5  . Murmur 9/92011   echo-MVP posterior leaflet with moderate mitral insufficiency and the jet directed anteriorly,left atrium mildly enlarged  . MVP (mitral valve prolapse)     Past Surgical History:  Procedure Laterality Date  . COLONOSCOPY    . TEE WITHOUT CARDIOVERSION N/A 09/21/2016   Procedure: TRANSESOPHAGEAL ECHOCARDIOGRAM (TEE);  Surgeon: Skeet Latch, MD;  Location: [place];  Service: Cardiovascular;  Laterality: N/A;  . VASECTOMY    . WISDOM TOOTH  EXTRACTION      History  Smoking Status  . Never Smoker  Smokeless Tobacco  . Never Used    History  Alcohol Use No    Family History  Problem Relation Age of Onset  . Kidney failure Father        died  . Kidney disease Father   . Hypertension Mother   . Lung cancer Mother   . Colon cancer Neg Hx     Review of Systems: As noted in history of present illness .  All other systems were reviewed and are negative.  Physical Exam: BP 140/80   Pulse 74   Ht 5\' 10"  (1.778 m)   Wt 170 lb (77.1 kg)   BMI 24.39 kg/m   he is a well-developed white male in no acute distress. His HEENT exam is unremarkable. He has no JVD or bruits. Lungs are clear. Cardiac exam reveals a regular rate and rhythm with a grade 3/6 late peaking midsystolic murmur at the apex.  There is no diastolic murmur or S3. Abdomen is soft and nontender without masses or bruits. Extremities are without edema. Pulses are 2+ and symmetric.   LABORATORY DATA:  Lab Results  Component Value Date   WBC 5.0 10/27/2015   HGB 15.6 10/27/2015   HCT 46.1 10/27/2015   PLT 197.0 10/27/2015   GLUCOSE 98 10/27/2015   CHOL 244 (H) 10/27/2015   TRIG 95.0 10/27/2015   HDL 67.30 10/27/2015  LDLDIRECT 123.4 06/20/2011   LDLCALC 158 (H) 10/27/2015   ALT 14 10/27/2015   AST 14 10/27/2015   NA 142 10/27/2015   K 4.1 10/27/2015   CL 105 10/27/2015   CREATININE 1.08 10/27/2015   BUN 18 10/27/2015   CO2 30 10/27/2015   TSH 3.82 10/27/2015   PSA 0.84 06/30/2014    Echo: 09/08/16: Study Conclusions  - Left ventricle: The cavity size was mildly dilated. Systolic   function was normal. The estimated ejection fraction was in the   range of 60% to 65%. Wall motion was normal; there were no   regional wall motion abnormalities. Features are consistent with   a pseudonormal left ventricular filling pattern, with concomitant   abnormal relaxation and increased filling pressure (grade 2   diastolic dysfunction). Doppler  parameters are consistent with   elevated ventricular end-diastolic filling pressure. - Aortic valve: There was mild regurgitation. - Mitral valve: There was moderate to severe regurgitation. - Left atrium: The atrium was moderately dilated. - Right ventricle: Systolic function was normal. - Tricuspid valve: There was moderate regurgitation. - Pulmonary arteries: Systolic pressure was mildly increased. PA   peak pressure: 43 mm Hg (S).  Impressions:  - Myxomatous, thickened mitral valve leaflets with bileaflet   prolapse posterior >> anterior. There is anteriorly directed jet,   mitral regurgitation is at least moderate to severe, most   probably severe, there is systolic anterior flow reversal in the   pulmonary vein, moderate pulmonary hypertension.   A TEE is recommended for further evaluation.   TEE 09/21/16:Study Conclusions  - Left ventricle: Systolic function was normal. The estimated   ejection fraction was in the range of 60% to 65%. Wall motion was   normal; there were no regional wall motion abnormalities. - Aortic valve: There was mild regurgitation. - Mitral valve: Severe thickening of the anterior leaflet and   posterior leaflet, consistent with myxomatous proliferation.   Severe, late systolic prolapse, involving the posterior leaflet   and mild prolapse of the anterior leaflet. There was severe,   mild-late systolic regurgitation directed eccentrically and   toward the septum. Severe regurgitation is suggested by an   effective regurgitant orifice >= 0.40 cm^2, a regurgitant volume   >= 60 ml, pulmonary vein systolic flow reversal, and Coanda   effect. Late systolic flow reversal was noted in the right upper   pulmonary vein. Valve area by continuity equation (using LVOT   flow): 1.78 cm^2. - Left atrium: No evidence of thrombus in the atrial cavity or   appendage. No evidence of thrombus in the atrial cavity or   appendage. - Right atrium: No evidence of  thrombus in the atrial cavity or   appendage. - Atrial septum: No defect or patent foramen ovale was identified   by color flow Doppler.  Assessment / Plan: 1. Mitral valve prolapse with severe mitral insufficiency by Echo and TEE. This has progressed from prior studies. Now with evidence of pulmonary HTN and LAE.  He is asymptomatic except for transient fluttering sensation. We discussed fully results of echo studies with patient and wife. I think he should be considered for minimally invasive MV repair. This is consistent with current guidelines. Will proceed with [facility] on August 17 to complete work up. The procedure and risks were reviewed including but not limited to death, myocardial infarction, stroke, arrythmias, bleeding, transfusion, emergency surgery, dye allergy, or renal dysfunction. The patient voices understanding and is agreeable to proceed. Will check CXR and  labs prior to cath. 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

## 2016-09-28 ENCOUNTER — Ambulatory Visit
Admission: RE | Admit: 2016-09-28 | Discharge: 2016-09-28 | Disposition: A | Discharge status: Home / Self Care | Payer: Managed Care, Other (non HMO) | Source: Ambulatory Visit | Attending: Cardiology | Admitting: Cardiology

## 2016-09-28 ENCOUNTER — Ambulatory Visit (INDEPENDENT_AMBULATORY_CARE_PROVIDER_SITE_OTHER): Payer: Managed Care, Other (non HMO) | Admitting: Cardiology

## 2016-09-28 ENCOUNTER — Other Ambulatory Visit: Payer: Self-pay | Admitting: Cardiology

## 2016-09-28 ENCOUNTER — Other Ambulatory Visit: Payer: Self-pay

## 2016-09-28 ENCOUNTER — Encounter: Payer: Self-pay | Admitting: Cardiology

## 2016-09-28 VITALS — BP 140/80 | HR 74 | Ht 70.0 in | Wt 170.0 lb

## 2016-09-28 DIAGNOSIS — I341 Nonrheumatic mitral (valve) prolapse: Secondary | ICD-10-CM | POA: Diagnosis not present

## 2016-09-28 DIAGNOSIS — I340 Nonrheumatic mitral (valve) insufficiency: Secondary | ICD-10-CM

## 2016-10-21 LAB — PT AND PTT
APTT: 25 s (ref 24–33)
INR: 1 (ref 0.8–1.2)
PROTHROMBIN TIME: 10.7 s (ref 9.1–12.0)

## 2016-10-21 LAB — CBC WITH DIFFERENTIAL/PLATELET
BASOS ABS: 0.1 10*3/uL (ref 0.0–0.2)
Basos: 1 %
EOS (ABSOLUTE): 0.1 10*3/uL (ref 0.0–0.4)
EOS: 1 %
HEMOGLOBIN: 14.8 g/dL (ref 13.0–17.7)
Hematocrit: 43.6 % (ref 37.5–51.0)
IMMATURE GRANS (ABS): 0 10*3/uL (ref 0.0–0.1)
IMMATURE GRANULOCYTES: 0 %
LYMPHS: 17 %
Lymphocytes Absolute: 0.9 10*3/uL (ref 0.7–3.1)
MCH: 28.7 pg (ref 26.6–33.0)
MCHC: 33.9 g/dL (ref 31.5–35.7)
MCV: 85 fL (ref 79–97)
MONOCYTES: 7 %
Monocytes Absolute: 0.4 10*3/uL (ref 0.1–0.9)
Neutrophils Absolute: 4.1 10*3/uL (ref 1.4–7.0)
Neutrophils: 74 %
Platelets: 203 10*3/uL (ref 150–379)
RBC: 5.16 x10E6/uL (ref 4.14–5.80)
RDW: 12.8 % (ref 12.3–15.4)
WBC: 5.5 10*3/uL (ref 3.4–10.8)

## 2016-10-21 LAB — BASIC METABOLIC PANEL
BUN/Creatinine Ratio: 18 (ref 10–24)
BUN: 18 mg/dL (ref 8–27)
CHLORIDE: 106 mmol/L (ref 96–106)
CO2: 22 mmol/L (ref 20–29)
Calcium: 9.6 mg/dL (ref 8.6–10.2)
Creatinine, Ser: 1 mg/dL (ref 0.76–1.27)
GFR calc Af Amer: 94 mL/min/{1.73_m2} (ref >59)
GFR, EST NON AFRICAN AMERICAN: 81 mL/min/{1.73_m2} (ref >59)
GLUCOSE: 127 mg/dL — AB (ref 65–99)
POTASSIUM: 4.3 mmol/L (ref 3.5–5.2)
SODIUM: 145 mmol/L — AB (ref 134–144)

## 2016-10-26 ENCOUNTER — Telehealth: Payer: Self-pay

## 2016-10-26 NOTE — Telephone Encounter (Signed)
Left message per DPR.  Patient contacted pre-catheterization at [facility] scheduled for:  10/28/2016 @ 0730 Verified arrival time and place:  NT @ 0530 Confirmed AM meds to be taken pre-cath with sip of water: Take ASA Patient must have responsible person to drive home post procedure and observe patient for 24 hours Addl concerns:  Left this nurse name and # for call back with any questions/concerns

## 2016-10-28 ENCOUNTER — Encounter (HOSPITAL_COMMUNITY): Payer: Self-pay | Admitting: Cardiology

## 2016-10-28 ENCOUNTER — Ambulatory Visit (HOSPITAL_COMMUNITY)
Admission: RE | Admit: 2016-10-28 | Discharge: 2016-10-28 | Disposition: A | Discharge status: Home / Self Care | Payer: Managed Care, Other (non HMO) | Source: Ambulatory Visit | Attending: Cardiology | Admitting: Cardiology

## 2016-10-28 ENCOUNTER — Encounter (HOSPITAL_COMMUNITY)
Admission: RE | Disposition: A | Discharge status: Home / Self Care | Payer: Self-pay | Source: Ambulatory Visit | Attending: Cardiology

## 2016-10-28 DIAGNOSIS — I340 Nonrheumatic mitral (valve) insufficiency: Secondary | ICD-10-CM | POA: Diagnosis present

## 2016-10-28 DIAGNOSIS — Z7982 Long term (current) use of aspirin: Secondary | ICD-10-CM | POA: Diagnosis not present

## 2016-10-28 DIAGNOSIS — Z79899 Other long term (current) drug therapy: Secondary | ICD-10-CM | POA: Insufficient documentation

## 2016-10-28 DIAGNOSIS — I341 Nonrheumatic mitral (valve) prolapse: Secondary | ICD-10-CM | POA: Diagnosis present

## 2016-10-28 HISTORY — PX: RIGHT/LEFT HEART CATH AND CORONARY ANGIOGRAPHY: CATH118266

## 2016-10-28 LAB — POCT I-STAT 3, VENOUS BLOOD GAS (G3P V)
ACID-BASE EXCESS: 1 mmol/L (ref 0.0–2.0)
BICARBONATE: 26.4 mmol/L (ref 20.0–28.0)
O2 Saturation: 78 %
PCO2 VEN: 45.2 mmHg (ref 44.0–60.0)
PO2 VEN: 44 mmHg (ref 32.0–45.0)
TCO2: 28 mmol/L (ref 0–100)
pH, Ven: 7.374 (ref 7.250–7.430)

## 2016-10-28 LAB — POCT I-STAT 3, ART BLOOD GAS (G3+)
BICARBONATE: 25.2 mmol/L (ref 20.0–28.0)
O2 SAT: 98 %
PCO2 ART: 41.3 mmHg (ref 32.0–48.0)
TCO2: 26 mmol/L (ref 0–100)
pH, Arterial: 7.394 (ref 7.350–7.450)
pO2, Arterial: 101 mmHg (ref 83.0–108.0)

## 2016-10-28 SURGERY — RIGHT/LEFT HEART CATH AND CORONARY ANGIOGRAPHY
Anesthesia: LOCAL

## 2016-10-28 MED ORDER — HEPARIN (PORCINE) IN NACL 2-0.9 UNIT/ML-% IJ SOLN
INTRAMUSCULAR | Status: AC
  Filled 2016-10-28: qty 1000

## 2016-10-28 MED ORDER — SODIUM CHLORIDE 0.9 % IV SOLN
INTRAVENOUS | Status: DC
  Administered 2016-10-28: 07:00:00 via INTRAVENOUS

## 2016-10-28 MED ORDER — MIDAZOLAM HCL 2 MG/2ML IJ SOLN
INTRAMUSCULAR | Status: DC | PRN
  Administered 2016-10-28: 1 mg via INTRAVENOUS

## 2016-10-28 MED ORDER — IOPAMIDOL (ISOVUE-370) INJECTION 76%
INTRAVENOUS | Status: DC | PRN
  Administered 2016-10-28: 60 mL via INTRA_ARTERIAL

## 2016-10-28 MED ORDER — FENTANYL CITRATE (PF) 100 MCG/2ML IJ SOLN
INTRAMUSCULAR | Status: AC
  Filled 2016-10-28: qty 2

## 2016-10-28 MED ORDER — SODIUM CHLORIDE 0.9 % WEIGHT BASED INFUSION: 1.0000 mL/kg/h | INTRAVENOUS | Status: DC

## 2016-10-28 MED ORDER — IOPAMIDOL (ISOVUE-370) INJECTION 76%
INTRAVENOUS | Status: AC
  Filled 2016-10-28: qty 100

## 2016-10-28 MED ORDER — SODIUM CHLORIDE 0.9% FLUSH: 3.0000 mL | INTRAVENOUS | Status: DC | PRN

## 2016-10-28 MED ORDER — SODIUM CHLORIDE 0.9% FLUSH: 3.0000 mL | Freq: Two times a day (BID) | INTRAVENOUS | Status: DC

## 2016-10-28 MED ORDER — MIDAZOLAM HCL 2 MG/2ML IJ SOLN
INTRAMUSCULAR | Status: AC
  Filled 2016-10-28: qty 2

## 2016-10-28 MED ORDER — HEPARIN SODIUM (PORCINE) 1000 UNIT/ML IJ SOLN
INTRAMUSCULAR | Status: DC | PRN
  Administered 2016-10-28: 4000 [IU] via INTRAVENOUS

## 2016-10-28 MED ORDER — HEPARIN (PORCINE) IN NACL 2-0.9 UNIT/ML-% IJ SOLN
INTRAMUSCULAR | Status: AC | PRN
  Administered 2016-10-28: 1000 mL

## 2016-10-28 MED ORDER — LIDOCAINE HCL (PF) 1 % IJ SOLN
INTRAMUSCULAR | Status: AC
Start: 2016-10-28 — End: 2016-10-28
  Filled 2016-10-28: qty 30

## 2016-10-28 MED ORDER — HEPARIN (PORCINE) IN NACL 2-0.9 UNIT/ML-% IJ SOLN
INTRAMUSCULAR | Status: DC | PRN
  Administered 2016-10-28: 08:00:00 via INTRA_ARTERIAL

## 2016-10-28 MED ORDER — HEPARIN SODIUM (PORCINE) 1000 UNIT/ML IJ SOLN
INTRAMUSCULAR | Status: AC
  Filled 2016-10-28: qty 1

## 2016-10-28 MED ORDER — SODIUM CHLORIDE 0.9 % IV SOLN: 250.0000 mL | INTRAVENOUS | Status: DC | PRN

## 2016-10-28 MED ORDER — ASPIRIN 81 MG PO CHEW: 81.0000 mg | CHEWABLE_TABLET | ORAL | Status: DC

## 2016-10-28 MED ORDER — FENTANYL CITRATE (PF) 100 MCG/2ML IJ SOLN
INTRAMUSCULAR | Status: DC | PRN
  Administered 2016-10-28: 25 ug via INTRAVENOUS

## 2016-10-28 MED ORDER — LIDOCAINE HCL (PF) 1 % IJ SOLN
INTRAMUSCULAR | Status: DC | PRN
  Administered 2016-10-28 (×2): 5 mL

## 2016-10-28 MED ORDER — VERAPAMIL HCL 2.5 MG/ML IV SOLN
INTRAVENOUS | Status: AC
  Filled 2016-10-28: qty 2

## 2016-10-28 SURGICAL SUPPLY — 12 items
CATH 5FR JL3.5 JR4 ANG PIG MP (CATHETERS) ×2 IMPLANT
CATH BALLN WEDGE 5F 110CM (CATHETERS) ×2 IMPLANT
DEVICE RAD COMP TR BAND LRG (VASCULAR PRODUCTS) ×2 IMPLANT
GLIDESHEATH SLEND SS 6F .021 (SHEATH) ×2 IMPLANT
GUIDEWIRE INQWIRE 1.5J.035X260 (WIRE) ×1 IMPLANT
INQWIRE 1.5J .035X260CM (WIRE) ×2
KIT HEART LEFT (KITS) ×2 IMPLANT
PACK CARDIAC CATHETERIZATION (CUSTOM PROCEDURE TRAY) ×2 IMPLANT
SHEATH GLIDE SLENDER 4/5FR (SHEATH) ×2 IMPLANT
SYR MEDRAD MARK V 150ML (SYRINGE) ×2 IMPLANT
TRANSDUCER W/STOPCOCK (MISCELLANEOUS) ×2 IMPLANT
TUBING CIL FLEX 10 FLL-RA (TUBING) ×2 IMPLANT

## 2016-10-28 NOTE — Interval H&P Note (Signed)
History and Physical Interval Note:  10/28/2016 7:22 AM  Alan Mcguire  has presented today for surgery, with the diagnosis of mitriak regurgetation  The various methods of treatment have been discussed with the patient and family. After consideration of risks, benefits and other options for treatment, the patient has consented to  Procedure(s): Right/Left Heart Cath and Coronary Angiography (N/A) as a surgical intervention .  The patient's history has been reviewed, patient examined, no change in status, stable for surgery.  I have reviewed the patient's chart and labs.  Questions were answered to the patient's satisfaction.     [name] [facility] 10/28/2016 7:22 AM

## 2016-10-28 NOTE — H&P (View-Only) (Signed)
Alan Mcguire Date of Birth: [birth year] Medical Record #[id number]  History of Present Illness: Alan Mcguire is seen  for followup of mitral valve prolapse s/p recent TEE.  He has a history of mitral valve prolapse with moderate mitral insufficiency. When seen recently exam was concerning for worsening murmur. Echo was done suggesting severe MR. TEE was then performed as noted below.    He works in the [employer]. He states he is doing very well. He denies any dyspnea, palpitations, or chest pain. He remains active and very exercises regularly. He does note some fluttering sensation when lying down in the evening that lasts less than 20-30 seconds. His energy level is good.   Current Outpatient Prescriptions on File Prior to Visit  Medication Sig Dispense Refill  . aspirin EC 81 MG tablet Take 81 mg by mouth daily.    . cyclobenzaprine (FLEXERIL) 10 MG tablet Take 1 tablet (10 mg total) by mouth 3 (three) times daily as needed for muscle spasms. 30 tablet 0  . methylPREDNISolone (MEDROL DOSEPAK) 4 MG TBPK tablet Take as directed 21 tablet 0  . Multiple Vitamin (MULTIVITAMIN) capsule Take 1 capsule by mouth daily.    . Saw Palmetto, Serenoa repens, (SAW PALMETTO PO) Take 1 tablet by mouth daily.     No current facility-administered medications on file prior to visit.     Allergies  Allergen Reactions  . Other     Bee stings    Past Medical History:  Diagnosis Date  . Lumbar disc disease    L4-5  . Murmur 9/92011   echo-MVP posterior leaflet with moderate mitral insufficiency and the jet directed anteriorly,left atrium mildly enlarged  . MVP (mitral valve prolapse)     Past Surgical History:  Procedure Laterality Date  . COLONOSCOPY    . TEE WITHOUT CARDIOVERSION N/A 09/21/2016   Procedure: TRANSESOPHAGEAL ECHOCARDIOGRAM (TEE);  Surgeon: Skeet Latch, MD;  Location: [facility];  Service: Cardiovascular;  Laterality: N/A;  . VASECTOMY    . WISDOM TOOTH  EXTRACTION      History  Smoking Status  . Never Smoker  Smokeless Tobacco  . Never Used    History  Alcohol Use No    Family History  Problem Relation Age of Onset  . Kidney failure Father        died  . Kidney disease Father   . Hypertension Mother   . Lung cancer Mother   . Colon cancer Neg Hx     Review of Systems: As noted in history of present illness .  All other systems were reviewed and are negative.  Physical Exam: BP 140/80   Pulse 74   Ht 5\' 10"  (1.778 m)   Wt 170 lb (77.1 kg)   BMI 24.39 kg/m   he is a well-developed white male in no acute distress. His HEENT exam is unremarkable. He has no JVD or bruits. Lungs are clear. Cardiac exam reveals a regular rate and rhythm with a grade 3/6 late peaking midsystolic murmur at the apex.  There is no diastolic murmur or S3. Abdomen is soft and nontender without masses or bruits. Extremities are without edema. Pulses are 2+ and symmetric.   LABORATORY DATA:  Lab Results  Component Value Date   WBC 5.0 10/27/2015   HGB 15.6 10/27/2015   HCT 46.1 10/27/2015   PLT 197.0 10/27/2015   GLUCOSE 98 10/27/2015   CHOL 244 (H) 10/27/2015   TRIG 95.0 10/27/2015   HDL 67.30 10/27/2015  LDLDIRECT 123.4 06/20/2011   LDLCALC 158 (H) 10/27/2015   ALT 14 10/27/2015   AST 14 10/27/2015   NA 142 10/27/2015   K 4.1 10/27/2015   CL 105 10/27/2015   CREATININE 1.08 10/27/2015   BUN 18 10/27/2015   CO2 30 10/27/2015   TSH 3.82 10/27/2015   PSA 0.84 06/30/2014    Echo: 09/08/16: Study Conclusions  - Left ventricle: The cavity size was mildly dilated. Systolic   function was normal. The estimated ejection fraction was in the   range of 60% to 65%. Wall motion was normal; there were no   regional wall motion abnormalities. Features are consistent with   a pseudonormal left ventricular filling pattern, with concomitant   abnormal relaxation and increased filling pressure (grade 2   diastolic dysfunction). Doppler  parameters are consistent with   elevated ventricular end-diastolic filling pressure. - Aortic valve: There was mild regurgitation. - Mitral valve: There was moderate to severe regurgitation. - Left atrium: The atrium was moderately dilated. - Right ventricle: Systolic function was normal. - Tricuspid valve: There was moderate regurgitation. - Pulmonary arteries: Systolic pressure was mildly increased. PA   peak pressure: 43 mm Hg (S).  Impressions:  - Myxomatous, thickened mitral valve leaflets with bileaflet   prolapse posterior >> anterior. There is anteriorly directed jet,   mitral regurgitation is at least moderate to severe, most   probably severe, there is systolic anterior flow reversal in the   pulmonary vein, moderate pulmonary hypertension.   A TEE is recommended for further evaluation.   TEE 09/21/16:Study Conclusions  - Left ventricle: Systolic function was normal. The estimated   ejection fraction was in the range of 60% to 65%. Wall motion was   normal; there were no regional wall motion abnormalities. - Aortic valve: There was mild regurgitation. - Mitral valve: Severe thickening of the anterior leaflet and   posterior leaflet, consistent with myxomatous proliferation.   Severe, late systolic prolapse, involving the posterior leaflet   and mild prolapse of the anterior leaflet. There was severe,   mild-late systolic regurgitation directed eccentrically and   toward the septum. Severe regurgitation is suggested by an   effective regurgitant orifice >= 0.40 cm^2, a regurgitant volume   >= 60 ml, pulmonary vein systolic flow reversal, and Coanda   effect. Late systolic flow reversal was noted in the right upper   pulmonary vein. Valve area by continuity equation (using LVOT   flow): 1.78 cm^2. - Left atrium: No evidence of thrombus in the atrial cavity or   appendage. No evidence of thrombus in the atrial cavity or   appendage. - Right atrium: No evidence of  thrombus in the atrial cavity or   appendage. - Atrial septum: No defect or patent foramen ovale was identified   by color flow Doppler.  Assessment / Plan: 1. Mitral valve prolapse with severe mitral insufficiency by Echo and TEE. This has progressed from prior studies. Now with evidence of pulmonary HTN and LAE.  He is asymptomatic except for transient fluttering sensation. We discussed fully results of echo studies with patient and wife. I think he should be considered for minimally invasive MV repair. This is consistent with current guidelines. Will proceed with [facility] on August 17 to complete work up. The procedure and risks were reviewed including but not limited to death, myocardial infarction, stroke, arrythmias, bleeding, transfusion, emergency surgery, dye allergy, or renal dysfunction. The patient voices understanding and is agreeable to proceed. Will check CXR and  labs prior to cath. 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

## 2016-10-28 NOTE — Discharge Instructions (Signed)

## 2016-11-07 NOTE — Progress Notes (Signed)
Alan Mcguire Date of Birth: October 17, 1955 Medical Record #[id number]  History of Present Illness: Alan Mcguire is seen  for followup of mitral valve prolapse s/p recent cardiac cath.  He has a history of mitral valve prolapse with moderate mitral insufficiency. When seen recently exam was concerning for worsening murmur. Echo was done suggesting severe MR. TEE was then performed and showed MVP with severe MR. He subsequently underwent cardiac cath showing normal coronary anatomy and LV function. Normal right heart pressures.   On follow up today he states he is doing very well. He denies any dyspnea, palpitations, or chest pain. He remains active and very exercises regularly.   Current Outpatient Prescriptions on File Prior to Visit  Medication Sig Dispense Refill  . aspirin EC 81 MG tablet Take 81 mg by mouth daily.    . Multiple Vitamin (MULTIVITAMIN) capsule Take 1 capsule by mouth daily.    . Saw Palmetto, Serenoa repens, (SAW PALMETTO PO) Take 1 tablet by mouth daily.     No current facility-administered medications on file prior to visit.     Allergies  Allergen Reactions  . Other     Bee stings    Past Medical History:  Diagnosis Date  . Lumbar disc disease    L4-5  . Murmur 9/92011   echo-MVP posterior leaflet with moderate mitral insufficiency and the jet directed anteriorly,left atrium mildly enlarged  . MVP (mitral valve prolapse)     Past Surgical History:  Procedure Laterality Date  . COLONOSCOPY    . RIGHT/LEFT HEART CATH AND CORONARY ANGIOGRAPHY N/A 10/28/2016   Procedure: Right/Left Heart Cath and Coronary Angiography;  Surgeon: [name], Shilpa Bushee M, MD;  Location: [facility];  Service: Cardiovascular;  Laterality: N/A;  . TEE WITHOUT CARDIOVERSION N/A 09/21/2016   Procedure: TRANSESOPHAGEAL ECHOCARDIOGRAM (TEE);  Surgeon: Skeet Latch, MD;  Location: [facility];  Service: Cardiovascular;  Laterality: N/A;  . VASECTOMY    . WISDOM TOOTH EXTRACTION       History  Smoking Status  . Never Smoker  Smokeless Tobacco  . Never Used    History  Alcohol Use No    Family History  Problem Relation Age of Onset  . Kidney failure Father        died  . Kidney disease Father   . Hypertension Mother   . Lung cancer Mother   . Colon cancer Neg Hx     Review of Systems: As noted in history of present illness .  All other systems were reviewed and are negative.  Physical Exam: BP 130/80 (BP Location: Right Arm, Patient Position: Sitting, Cuff Size: Normal)   Pulse 76   Ht 5\' 10"  (1.778 m)   Wt 172 lb (78 kg)   BMI 24.68 kg/m   he is a well-developed white male in no acute distress. His HEENT exam is unremarkable. He has no JVD or bruits. Lungs are clear. Cardiac exam reveals a regular rate and rhythm with a grade 3/6 late peaking midsystolic murmur at the apex.  There is no diastolic murmur or S3. Abdomen is soft and nontender without masses or bruits. Extremities are without edema. Radial cath site has healed well without hematoma. Pulses are 2+ and symmetric.   LABORATORY DATA:  Lab Results  Component Value Date   WBC 5.5 10/21/2016   HGB 14.8 10/21/2016   HCT 43.6 10/21/2016   PLT 203 10/21/2016   GLUCOSE 127 (H) 10/21/2016   CHOL 244 (H) 10/27/2015   TRIG  95.0 10/27/2015   HDL 67.30 10/27/2015   LDLDIRECT 123.4 06/20/2011   LDLCALC 158 (H) 10/27/2015   ALT 14 10/27/2015   AST 14 10/27/2015   NA 145 (H) 10/21/2016   K 4.3 10/21/2016   CL 106 10/21/2016   CREATININE 1.00 10/21/2016   BUN 18 10/21/2016   CO2 22 10/21/2016   TSH 3.82 10/27/2015   PSA 0.84 06/30/2014   INR 1.0 10/21/2016    Echo: 09/08/16: Study Conclusions  - Left ventricle: The cavity size was mildly dilated. Systolic   function was normal. The estimated ejection fraction was in the   range of 60% to 65%. Wall motion was normal; there were no   regional wall motion abnormalities. Features are consistent with   a pseudonormal left ventricular  filling pattern, with concomitant   abnormal relaxation and increased filling pressure (grade 2   diastolic dysfunction). Doppler parameters are consistent with   elevated ventricular end-diastolic filling pressure. - Aortic valve: There was mild regurgitation. - Mitral valve: There was moderate to severe regurgitation. - Left atrium: The atrium was moderately dilated. - Right ventricle: Systolic function was normal. - Tricuspid valve: There was moderate regurgitation. - Pulmonary arteries: Systolic pressure was mildly increased. PA   peak pressure: 43 mm Hg (S).  Impressions:  - Myxomatous, thickened mitral valve leaflets with bileaflet   prolapse posterior >> anterior. There is anteriorly directed jet,   mitral regurgitation is at least moderate to severe, most   probably severe, there is systolic anterior flow reversal in the   pulmonary vein, moderate pulmonary hypertension.   A TEE is recommended for further evaluation.   TEE 09/21/16:Study Conclusions  - Left ventricle: Systolic function was normal. The estimated   ejection fraction was in the range of 60% to 65%. Wall motion was   normal; there were no regional wall motion abnormalities. - Aortic valve: There was mild regurgitation. - Mitral valve: Severe thickening of the anterior leaflet and   posterior leaflet, consistent with myxomatous proliferation.   Severe, late systolic prolapse, involving the posterior leaflet   and mild prolapse of the anterior leaflet. There was severe,   mild-late systolic regurgitation directed eccentrically and   toward the septum. Severe regurgitation is suggested by an   effective regurgitant orifice >= 0.40 cm^2, a regurgitant volume   >= 60 ml, pulmonary vein systolic flow reversal, and Coanda   effect. Late systolic flow reversal was noted in the right upper   pulmonary vein. Valve area by continuity equation (using LVOT   flow): 1.78 cm^2. - Left atrium: No evidence of thrombus in  the atrial cavity or   appendage. No evidence of thrombus in the atrial cavity or   appendage. - Right atrium: No evidence of thrombus in the atrial cavity or   appendage. - Atrial septum: No defect or patent foramen ovale was identified   by color flow Doppler.  Cardiac cath 10/28/16: Procedures   Right/Left Heart Cath and Coronary Angiography  Conclusion     The left ventricular systolic function is normal.  The left ventricular ejection fraction is 55-65% by visual estimate.  There is no aortic valve stenosis.  There is severe (4+) mitral regurgitation and moderate mitral valve prolapse.  Normal coronary anatomy   1. Normal coronary anatomy 2. Normal LV function 3. Normal right heart pressures. 4. Normal PCWP with mildly elevated LVEDP 5. Normal cardiac output.   Plan: consider for MV repair.       Assessment /  Plan: 1. Mitral valve prolapse with severe mitral insufficiency by Echo and TEE. This has progressed from prior studies. Now with evidence of pulmonary HTN and LAE by Echo.  Cardiac cath showed normal right heart pressures. He is asymptomatic except for transient fluttering sensation. Plan for minimally invasive MV repair. This is consistent with current guidelines. He is scheduled to see Dr. Roxy Manns tomorrow for surgical evaluation.

## 2016-11-08 ENCOUNTER — Ambulatory Visit (INDEPENDENT_AMBULATORY_CARE_PROVIDER_SITE_OTHER): Payer: 59 | Admitting: Cardiology

## 2016-11-08 ENCOUNTER — Encounter: Payer: Self-pay | Admitting: Cardiology

## 2016-11-08 VITALS — BP 130/80 | HR 76 | Ht 70.0 in | Wt 172.0 lb

## 2016-11-08 DIAGNOSIS — I341 Nonrheumatic mitral (valve) prolapse: Secondary | ICD-10-CM

## 2016-11-08 DIAGNOSIS — I340 Nonrheumatic mitral (valve) insufficiency: Secondary | ICD-10-CM

## 2016-11-08 NOTE — Patient Instructions (Signed)
We will see you in 6 months

## 2016-11-09 ENCOUNTER — Institutional Professional Consult (permissible substitution) (INDEPENDENT_AMBULATORY_CARE_PROVIDER_SITE_OTHER): Payer: 59 | Admitting: Thoracic Surgery (Cardiothoracic Vascular Surgery)

## 2016-11-09 ENCOUNTER — Encounter: Payer: Self-pay | Admitting: Thoracic Surgery (Cardiothoracic Vascular Surgery)

## 2016-11-09 VITALS — BP 138/80 | HR 68 | Resp 16 | Ht 70.0 in | Wt 172.0 lb

## 2016-11-09 DIAGNOSIS — I340 Nonrheumatic mitral (valve) insufficiency: Secondary | ICD-10-CM | POA: Diagnosis not present

## 2016-11-09 NOTE — Progress Notes (Signed)
[address]       [place] [address]             [facility] REPORT  Referring Provider is [name], [name], MD PCP is Dorothyann Peng, NP  Chief Complaint  Patient presents with  . Mitral Regurgitation    eval for MINI MVR.Marland KitchenTEE 09/21/16, CATH 10/28/16.    HPI:  Patient is a 61 year old male with history of mitral valve prolapse and mitral regurgitation referred for surgical consultation to discuss treatment options. The patient has been remarkably healthy and physically active all of his adult life. Approximately 7 or 8 years ago he was noted to have a systolic murmur on physical exam by his primary care physician, and transthoracic echocardiogram revealed the presence of mitral valve prolapse with mitral regurgitation. He was referred to Dr. [name] who has followed him intermittently ever since. Recently the patient's murmur was noted to be more prominent on physical exam and follow-up transthoracic echocardiogram performed 09/08/2016 revealed mitral valve prolapse with what was felt to be moderate to severe mitral regurgitation. Left ventricular systolic function remained normal with ejection fraction estimated 60-65%. The left ventricle was noted to be mildly dilated. The patient subsequently underwent transesophageal echocardiogram and diagnostic cardiac catheterization.  TEE performed 09/21/2016 confirmed the presence of myxomatous degenerative disease of the mitral valve with bileaflet prolapse including a flail segment of the posterior leaflet and severe mitral regurgitation.  Cardiac catheterization performed 10/28/2016 revealed normal coronary arteries with no significant coronary artery disease.  There were normal right heart pressures with mildly elevated left ventricular end-diastolic pressure and normal left ventricular systolic function. The patient was referred for surgical consultation.  The patient is married and  lives locally in [place] with his wife who used to work on the [facility] [employer] cardiac surgery team in the operating room.  They have 4 adult children. 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 He has remained fairly active physically although he admits that he does not exercise on a regular basis. He states that with real strenuous exertion such as swimming he has noticed a tendency to get some degree of exertional shortness of breath. However, this only occurs with strenuous exertion and it does not limit his activities at all. He has no exertional shortness of breath with ordinary day-to-day activities. He does not experience any chest pain or chest tightness. He reports occasional palpitations. He has not had any dizziness or syncope.  Past Medical History:  Diagnosis Date  . Lumbar disc disease    L4-5  . Murmur 9/92011   echo-MVP posterior leaflet with moderate mitral insufficiency and the jet directed anteriorly,left atrium mildly enlarged  . MVP (mitral valve prolapse)   . Non-rheumatic mitral regurgitation     Past Surgical History:  Procedure Laterality Date  . COLONOSCOPY    . RIGHT/LEFT HEART CATH AND CORONARY ANGIOGRAPHY N/A 10/28/2016   Procedure: Right/Left Heart Cath and Coronary Angiography;  Surgeon: [name], [name], MD;  Location: [facility];  Service: Cardiovascular;  Laterality: N/A;  . TEE WITHOUT CARDIOVERSION N/A 09/21/2016   Procedure: TRANSESOPHAGEAL ECHOCARDIOGRAM (TEE);  Surgeon: Skeet Latch, MD;  Location: [facility];  Service: Cardiovascular;  Laterality: N/A;  . VASECTOMY    . WISDOM TOOTH EXTRACTION      Family History  Problem Relation Age of Onset  . Kidney failure Father  died  . Kidney disease Father   . Hypertension Mother   . Lung cancer Mother   . Colon cancer Neg Hx     Social History   Social History  . Marital status: Married    Spouse name: N/A  . Number  of children: N/A  . Years of education: N/A   Occupational History  . Not on file.   Social History Main Topics  . Smoking status: Never Smoker  . Smokeless tobacco: Never Used  . Alcohol use No  . Drug use: No  . Sexual activity: Not on file   Other Topics Concern  . Not on file   Social History Narrative   Materials engineer company    Married    4 kids ( 2 in college and 2 married)    He likes to play golf and work in the yard.    He coaches swimming    Current Outpatient Prescriptions  Medication Sig Dispense Refill  . aspirin EC 81 MG tablet Take 81 mg by mouth daily.    . Multiple Vitamin (MULTIVITAMIN) capsule Take 1 capsule by mouth daily.    . Saw Palmetto, Serenoa repens, (SAW PALMETTO PO) Take 1 tablet by mouth daily.     No current facility-administered medications for this visit.     Allergies  Allergen Reactions  . Other     Bee stings      Review of Systems:   General:  normal appetite, normal energy, no weight gain, no weight loss, no fever  Cardiac:  no chest pain with exertion, no chest pain at rest, + SOB with very strenuous exertion, no resting SOB, no PND, no orthopnea, no palpitations, no arrhythmia, no atrial fibrillation, no LE edema, no dizzy spells, no syncope  Respiratory:  no shortness of breath, no home oxygen, no productive cough, no dry cough, no bronchitis, no wheezing, no hemoptysis, no asthma, no pain with inspiration or cough, no sleep apnea, no CPAP at night  GI:   no difficulty swallowing, no reflux, no frequent heartburn, no hiatal hernia, no abdominal pain, no constipation, no diarrhea, no hematochezia, no hematemesis, no melena  GU:   no dysuria,  no frequency, no urinary tract infection, no hematuria, no enlarged prostate, no kidney stones, no kidney disease  Vascular:  no pain suggestive of claudication, no pain in feet, no leg cramps, no varicose veins, no DVT, no non-healing foot ulcer  Neuro:   no stroke, no TIA's, no  seizures, no headaches, no temporary blindness one eye,  no slurred speech, no peripheral neuropathy, no chronic pain, no instability of gait, no memory/cognitive dysfunction  Musculoskeletal: no arthritis, no joint swelling, no myalgias, no difficulty walking, normal mobility   Skin:   no rash, no itching, no skin infections, no pressure sores or ulcerations  Psych:   no anxiety, no depression, no nervousness, no unusual recent stress  Eyes:   no blurry vision, no floaters, no recent vision changes, does not wears glasses or contacts  ENT:   no hearing loss, no loose or painful teeth, no dentures, last saw dentist within the past year  Hematologic:  no easy bruising, no abnormal bleeding, no clotting disorder, no frequent epistaxis  Endocrine:  no diabetes, does not check CBG's at home     Physical Exam:   BP 138/80 (BP Location: Left Arm, Patient Position: Sitting, Cuff Size: Large)   Pulse 68   Resp 16   Ht 5\' 10"  (1.778 m)  Wt 172 lb (78 kg)   SpO2 95% Comment: ON RA  BMI 24.68 kg/m   General:  Thin,  well-appearing  HEENT:  Unremarkable   Neck:   no JVD, no bruits, no adenopathy   Chest:   clear to auscultation, symmetrical breath sounds, no wheezes, no rhonchi   CV:   RRR, grade IV/VI holosystolic murmur heard all across the precordium w/ radiation to axilla and back  Abdomen:  soft, non-tender, no masses   Extremities:  warm, well-perfused, pulses palpable, no LE edema  Rectal/GU  Deferred  Neuro:   Grossly non-focal and symmetrical throughout  Skin:   Clean and dry, no rashes, no breakdown   Diagnostic Tests:  Transthoracic Echocardiography  Patient:    Alan Mcguire, Alan Mcguire MR #:       [id number] Study Date: 09/08/2016 Gender:     M Age:        61 Height:     177.8 cm Weight:     76.8 kg BSA:        1.95 m^2 Pt. Status: Room:   ATTENDING    Peter [name], M.D.  ORDERING     Peter [name], M.D.  REFERRING    Peter [name], M.D.  SONOGRAPHER  Wyatt Mage, RDCS   PERFORMING   [facility]  cc:  ------------------------------------------------------------------- LV EF: 60% -   65%  ------------------------------------------------------------------- Indications:      Mitral valve prolapse (I34.1).  ------------------------------------------------------------------- History:   PMH:   Murmur.  Mitral valve prolapse.  ------------------------------------------------------------------- Study Conclusions  - Left ventricle: The cavity size was mildly dilated. Systolic   function was normal. The estimated ejection fraction was in the   range of 60% to 65%. Wall motion was normal; there were no   regional wall motion abnormalities. Features are consistent with   a pseudonormal left ventricular filling pattern, with concomitant   abnormal relaxation and increased filling pressure (grade 2   diastolic dysfunction). Doppler parameters are consistent with   elevated ventricular end-diastolic filling pressure. - Aortic valve: There was mild regurgitation. - Mitral valve: There was moderate to severe regurgitation. - Left atrium: The atrium was moderately dilated. - Right ventricle: Systolic function was normal. - Tricuspid valve: There was moderate regurgitation. - Pulmonary arteries: Systolic pressure was mildly increased. PA   peak pressure: 43 mm Hg (S).  Impressions:  - Myxomatous, thickened mitral valve leaflets with bileaflet   prolapse posterior >> anterior. There is anteriorly directed jet,   mitral regurgitation is at least moderate to severe, most   probably severe, there is systolic anterior flow reversal in the   pulmonary vein, moderate pulmonary hypertension.   A TEE is recommended for further evaluation.  ------------------------------------------------------------------- Labs, prior tests, procedures, and surgery: Transthoracic echocardiography (05/25/2011).    The mitral valve showed moderate regurgitation.  EF was  60%.  ------------------------------------------------------------------- Study data:  Comparison was made to the study of 05/25/2011.  Study status:  Routine.  Procedure:  The patient reported no pain pre or post test. Transthoracic echocardiography. Image quality was adequate.  Study completion:  There were no complications. Transthoracic echocardiography.  M-mode, complete 2D, 3D, spectral Doppler, and color Doppler.  Birthdate:  Patient birthdate: [birth year].  Age:  Patient is 61 yr old.  Sex:  Gender: male. BMI: 24.3 kg/m^2.  Blood pressure:     118/64  Patient status: Outpatient.  Study date:  Study date: 09/08/2016. Study time: 01:01 PM.  Location:  [facility]  -------------------------------------------------------------------  -------------------------------------------------------------------  Left ventricle:  The cavity size was mildly dilated. Systolic function was normal. The estimated ejection fraction was in the range of 60% to 65%. Wall motion was normal; there were no regional wall motion abnormalities. Features are consistent with a pseudonormal left ventricular filling pattern, with concomitant abnormal relaxation and increased filling pressure (grade 2 diastolic dysfunction). Doppler parameters are consistent with elevated ventricular end-diastolic filling pressure.  ------------------------------------------------------------------- Aortic valve:   Trileaflet; normal thickness leaflets. Mobility was not restricted.  Doppler:  Transvalvular velocity was within the normal range. There was no stenosis. There was mild regurgitation.   ------------------------------------------------------------------- Aorta:  Aortic root: The aortic root was normal in size.  ------------------------------------------------------------------- Mitral valve:  Myxomatous, thickened leaflets with bileaflet prolapse posterior >> anterior. Mobility was not  restricted. Doppler:  Transvalvular velocity was within the normal range. There was no evidence for stenosis. There was moderate to severe regurgitation.    Valve area by pressure half-time: 3.1 cm^2. Indexed valve area by pressure half-time: 1.59 cm^2/m^2. Valve area by continuity equation (using LVOT flow): 1.24 cm^2. Indexed valve area by continuity equation (using LVOT flow): 0.63 cm^2/m^2. Mean gradient (D): 5 mm Hg. Peak gradient (D): 12 mm Hg.  ------------------------------------------------------------------- Left atrium:  The atrium was moderately dilated.  ------------------------------------------------------------------- Right ventricle:  The cavity size was normal. Wall thickness was normal. Systolic function was normal.  ------------------------------------------------------------------- Pulmonic valve:    Structurally normal valve.   Cusp separation was normal.  Doppler:  Transvalvular velocity was within the normal range. There was no evidence for stenosis. There was no regurgitation.  ------------------------------------------------------------------- Tricuspid valve:   Structurally normal valve.    Doppler: Transvalvular velocity was within the normal range. There was moderate regurgitation.  ------------------------------------------------------------------- Pulmonary artery:   The main pulmonary artery was normal-sized. Systolic pressure was mildly increased.  ------------------------------------------------------------------- Right atrium:  The atrium was normal in size.  ------------------------------------------------------------------- Pericardium:  There was no pericardial effusion.  ------------------------------------------------------------------- Systemic veins: Inferior vena cava: The vessel was normal in size.  ------------------------------------------------------------------- Measurements   Left ventricle                             Value          Reference  LV ID, ED, PLAX chordal           (H)     57    mm       43 - 52  LV ID, ES, PLAX chordal                   37    mm       23 - 38  LV fx shortening, PLAX chordal            35    %        >=29  LV PW thickness, ED                       10    mm       ---------  IVS/LV PW ratio, ED                       1              <=1.3  Stroke volume, 2D  54    ml       ---------  Stroke volume/bsa, 2D                     28    ml/m^2   ---------  LV e&', lateral                            7.18  cm/s     ---------  LV E/e&', lateral                          24.37          ---------  LV e&', medial                             9.14  cm/s     ---------  LV E/e&', medial                           19.15          ---------  LV e&', average                            8.16  cm/s     ---------  LV E/e&', average                          21.45          ---------    Ventricular septum                        Value          Reference  IVS thickness, ED                         10    mm       ---------    LVOT                                      Value          Reference  LVOT ID, S                                21    mm       ---------  LVOT area                                 3.46  cm^2     ---------  LVOT peak velocity, S                     101   cm/s     ---------  LVOT mean velocity, S                     57.8  cm/s     ---------  LVOT VTI, S  15.6  cm       ---------    Aortic valve                              Value          Reference  Aortic regurg pressure half-time          541   ms       ---------    Aorta                                     Value          Reference  Aortic root ID, ED                        33    mm       ---------  Ascending aorta ID, A-P, S                33    mm       ---------    Left atrium                               Value          Reference  LA ID, A-P, ES                            49     mm       ---------  LA ID/bsa, A-P                    (H)     2.51  cm/m^2   <=2.2  LA volume, S                              118   ml       ---------  LA volume/bsa, S                          60.4  ml/m^2   ---------  LA volume, ES, 1-p A4C                    97.4  ml       ---------  LA volume/bsa, ES, 1-p A4C                49.8  ml/m^2   ---------  LA volume, ES, 1-p A2C                    131   ml       ---------  LA volume/bsa, ES, 1-p A2C                67    ml/m^2   ---------    Mitral valve                              Value          Reference  Mitral E-wave peak velocity               175  cm/s     ---------  Mitral A-wave peak velocity               60    cm/s     ---------  Mitral mean velocity, D                   93    cm/s     ---------  Mitral deceleration time          (L)     137   ms       150 - 230  Mitral pressure half-time                 101   ms       ---------  Mitral mean gradient, D                   5     mm Hg    ---------  Mitral peak gradient, D                   12    mm Hg    ---------  Mitral E/A ratio, peak                    2.9            ---------  Mitral valve area, PHT, DP                3.1   cm^2     ---------  Mitral valve area/bsa, PHT, DP            1.59  cm^2/m^2 ---------  Mitral valve area, LVOT                   1.24  cm^2     ---------  continuity  Mitral valve area/bsa, LVOT               0.63  cm^2/m^2 ---------  continuity  Mitral annulus VTI, D                     43.7  cm       ---------  Mitral regurg VTI, PISA                   154   cm       ---------  Mitral ERO, PISA                          1.49  cm^2     ---------  Mitral regurg volume, PISA                229   ml       ---------    Pulmonary arteries                        Value          Reference  PA pressure, S, DP                (H)     43    mm Hg    <=30    Tricuspid valve                           Value          Reference  Tricuspid regurg peak  velocity             295   cm/s     ---------  Tricuspid peak RV-RA gradient             35    mm Hg    ---------    Systemic veins                            Value          Reference  Estimated CVP                             8     mm Hg    ---------    Right ventricle                           Value          Reference  TAPSE                                     26.4  mm       ---------  RV pressure, S, DP                (H)     43    mm Hg    <=30  RV s&', lateral, S                         13.6  cm/s     ---------  Legend: (L)  and  (H)  mark values outside specified reference range.  ------------------------------------------------------------------- Prepared and Electronically Authenticated by  Ena Dawley, M.D. 2018-06-28T14:27:18   Transesophageal Echocardiography  Patient:    Alan Mcguire, Alan Mcguire MR #:       [id number] Study Date: 09/21/2016 Gender:     M Age:        61 Height:     177.8 cm Weight:     76.8 kg BSA:        1.95 m^2 Pt. Status: Room:   ATTENDING    Peter [name], M.D.  ORDERING     Peter [name], M.D.  REFERRING    Peter [name], M.D.  ADMITTING    Skeet Latch, MD  PERFORMING   Skeet Latch, MD  SONOGRAPHER  Mikki Santee  cc:  ------------------------------------------------------------------- LV EF: 60% -   65%  ------------------------------------------------------------------- Indications:      Mitral regurgitation 424.0.  ------------------------------------------------------------------- History:   PMH:   Mitral valve prolapse.  ------------------------------------------------------------------- Study Conclusions  - Left ventricle: Systolic function was normal. The estimated   ejection fraction was in the range of 60% to 65%. Wall motion was   normal; there were no regional wall motion abnormalities. - Aortic valve: There was mild regurgitation. - Mitral valve: Severe thickening of the anterior leaflet and   posterior  leaflet, consistent with myxomatous proliferation.   Severe, late systolic prolapse, involving the posterior leaflet   and mild prolapse of the anterior leaflet. There was severe,   mild-late systolic regurgitation directed eccentrically and   toward the septum. Severe regurgitation is suggested by an   effective regurgitant orifice >= 0.40 cm^2, a regurgitant volume   >= 60 ml, pulmonary vein systolic flow reversal, and Coanda   effect. Late  systolic flow reversal was noted in the right upper   pulmonary vein. Valve area by continuity equation (using LVOT   flow): 1.78 cm^2. - Left atrium: No evidence of thrombus in the atrial cavity or   appendage. No evidence of thrombus in the atrial cavity or   appendage. - Right atrium: No evidence of thrombus in the atrial cavity or   appendage. - Atrial septum: No defect or patent foramen ovale was identified   by color flow Doppler.  ------------------------------------------------------------------- Study data:   Study status:  Routine.  Consent:  The risks, benefits, and alternatives to the procedure were explained to the patient and informed consent was obtained.  Procedure:  The patient reported no pain pre or post test. Initial setup. The patient was brought to the laboratory. Surface ECG leads were monitored. Sedation. Conscious sedation was administered by cardiology staff. Transesophageal echocardiography. Topical anesthesia was obtained using viscous lidocaine. A transesophageal probe was inserted by the attending cardiologist. Image quality was adequate.  Study completion:  The patient tolerated the procedure well. There were no complications.  Administered medications:   Fentanyl, 29mcg. Midazolam, 5mg .          Diagnostic transesophageal echocardiography.  2D and color Doppler.  Birthdate:  Patient birthdate: 06-Aug-1955.  Age:  Patient is 61 yr old.  Sex:  Gender: male.    BMI: 24.3 kg/m^2.  Blood pressure:     148/85   Patient status:  Outpatient.  Study date:  Study date: 09/21/2016. Study time: 10:56 AM.  Location:  [facility].  -------------------------------------------------------------------  ------------------------------------------------------------------- Left ventricle:  Systolic function was normal. The estimated ejection fraction was in the range of 60% to 65%. Wall motion was normal; there were no regional wall motion abnormalities.  ------------------------------------------------------------------- Aortic valve:   Structurally normal valve. Trileaflet; normal thickness leaflets. Cusp separation was normal.  Doppler:  There was mild regurgitation.  ------------------------------------------------------------------- Aorta:  There was no atheroma. There was no evidence for dissection. Aortic root: The aortic root was not dilated. Ascending aorta: The ascending aorta was normal in size. Aortic arch: The aortic arch was normal in size. Descending aorta: The descending aorta was normal in size.  ------------------------------------------------------------------- Mitral valve:   Severe thickening of the anterior leaflet and posterior leaflet, consistent with myxomatous proliferation. Leaflet separation was normal. Severe, late systolic prolapse, involving the posterior leaflet and mild prolapse of the anterior leaflet.  Doppler:  There was severe, mild-late systolic regurgitation directed eccentrically and toward the septum. Severe regurgitation is suggested by an effective regurgitant orifice >= 0.40 cm^2, a regurgitant volume >= 60 ml, pulmonary vein systolic flow reversal, and Coanda effect. Late systolic flow reversal was noted in the right upper pulmonary vein.    Valve area by continuity equation (using LVOT flow): 1.78 cm^2. Indexed valve area by continuity equation (using LVOT flow): 0.91 cm^2/m^2. Mean gradient (D): 4 mm  Hg.  ------------------------------------------------------------------- Left atrium:  The atrium was normal in size.  No evidence of thrombus in the atrial cavity or appendage.  No evidence of thrombus in the atrial cavity or appendage. The appendage was morphologically a left appendage, multilobulated, and of normal size. Emptying velocity was normal.  ------------------------------------------------------------------- Atrial septum:  No defect or patent foramen ovale was identified by color flow Doppler.  ------------------------------------------------------------------- Right ventricle:  The cavity size was normal. Wall thickness was normal. Systolic function was normal.  ------------------------------------------------------------------- Pulmonic valve:    Structurally normal valve.  ------------------------------------------------------------------- Tricuspid valve:   Structurally normal valve.   Leaflet  separation was normal.  Doppler:  There was mild regurgitation.  ------------------------------------------------------------------- Pulmonary artery:   The main pulmonary artery was normal-sized.  ------------------------------------------------------------------- Right atrium:  The atrium was normal in size.  No evidence of thrombus in the atrial cavity or appendage. The appendage was morphologically a right appendage.  ------------------------------------------------------------------- Pericardium:  There was no pericardial effusion.  ------------------------------------------------------------------- Measurements   Left ventricle                                Value  Stroke volume, 2D                             74    ml  Stroke volume/bsa, 2D                         38    ml/m^2    LVOT                                          Value  LVOT ID, S                                    22.8  mm  LVOT area                                     4.08  cm^2  LVOT  peak velocity, S                         111   cm/s  LVOT mean velocity, S                         57.9  cm/s  LVOT VTI, S                                   17.8  cm  LVOT peak gradient, S                         5     mm Hg  Stroke volume (SV), LVOT DP                   72.7  ml  Stroke index (SV/bsa), LVOT DP                37.2  ml/m^2    Mitral valve                                  Value  Mitral annulus diameter, A-P                  44.4  mm  Mitral mean velocity, D                       90.1  cm/s  Mitral mean gradient, D  4     mm Hg  Mitral valve area, LVOT continuity            1.78  cm^2  Mitral valve area/bsa, LVOT continuity        0.91  cm^2/m^2  Mitral annulus VTI, D                         41.6  cm  Mitral transvalvular flow, D                  518.9 ml  Mitral regurg volume, LVOT continuity         446.2 ml  Mitral regurg fraction, LVOT continuity       86    %  Mitral maximal regurg velocity, PISA          527   cm/s  Mitral regurg VTI, PISA                       92.8  cm  Mitral ERO, PISA                              1.28  cm^2  Mitral regurg volume, PISA                    119   ml  Mitral regurg fraction, PISA                  62.09 %    Tricuspid valve                               Value  Tricuspid regurg peak velocity                251   cm/s  Tricuspid peak RV-RA gradient                 25    mm Hg  Legend: (L)  and  (H)  mark values outside specified reference range.  ------------------------------------------------------------------- Prepared and Electronically Authenticated by  Skeet Latch, MD 2018-07-11T19:45:41   Right/Left Heart Cath and Coronary Angiography  Conclusion     The left ventricular systolic function is normal.  The left ventricular ejection fraction is 55-65% by visual estimate.  There is no aortic valve stenosis.  There is severe (4+) mitral regurgitation and moderate mitral valve  prolapse.  Normal coronary anatomy   1. Normal coronary anatomy 2. Normal LV function 3. Normal right heart pressures. 4. Normal PCWP with mildly elevated LVEDP 5. Normal cardiac output.   Plan: consider for MV repair.    Indications   Non-rheumatic mitral regurgitation [I34.0 (ICD-10-CM)]  Procedural Details/Technique   Technical Details Indication: 61 yo WM with MVP and severe MR.  Procedural Details: The right wrist was prepped, draped, and anesthetized with 1% lidocaine. Using the modified Seldinger technique a 6 Fr slender sheath was placed in the right radial artery and a 5 French sheath was placed in the right brachial vein. A Swan-Ganz catheter was used for the right heart catheterization. Standard protocol was followed for recording of right heart pressures and sampling of oxygen saturations. Fick cardiac output was calculated. Standard Judkins catheters were used for selective coronary angiography and left ventriculography. There were no immediate procedural complications. The patient was transferred to the post catheterization recovery area for  further monitoring. Contrast: 60 cc   Estimated blood loss <50 mL.  During this procedure the patient was administered the following to achieve and maintain moderate conscious sedation: Versed 1 mg, Fentanyl 25 mcg, while the patient's heart rate, blood pressure, and oxygen saturation were continuously monitored. The period of conscious sedation was 60 minutes, of which I was present face-to-face 100% of this time.    Complications   Complications documented before study signed (10/28/2016 8:32 AM EDT)    No complications were associated with this study.  Documented by [name], [name], MD - 10/28/2016 8:17 AM EDT    Coronary Findings   Dominance: Right  Left Main  Vessel was injected. Vessel is normal in caliber. Vessel is angiographically normal.  Left Anterior Descending  Vessel was injected. Vessel is normal in caliber.  Vessel is angiographically normal.  Left Circumflex  Vessel was injected. Vessel is normal in caliber. Vessel is angiographically normal.  Right Coronary Artery  Vessel was injected. Vessel is normal in caliber. Vessel is angiographically normal.  Right Heart   Right Heart Pressures Elevated LV EDP consistent with volume overload.    Wall Motion              Left Heart   Left Ventricle The left ventricular size is normal. The left ventricular systolic function is normal. LV end diastolic pressure is normal. The left ventricular ejection fraction is 55-65% by visual estimate. No regional wall motion abnormalities.    Mitral Valve There is severe (4+) mitral regurgitation and moderate mitral valve prolapse.    Aortic Valve There is no aortic valve stenosis.    Coronary Diagrams   Diagnostic Diagram       Implants     No implant documentation for this case.  PACS Images   Show images for Cardiac catheterization   Link to Procedure Log   Procedure Log    Hemo Data    Most Recent Value  Fick Cardiac Output 6.41 L/min  Fick Cardiac Output Index 3.3 (L/min)/BSA  RA A Wave 12 mmHg  RA V Wave 7 mmHg  RA Mean 5 mmHg  RV Systolic Pressure 34 mmHg  RV Diastolic Pressure 2 mmHg  RV EDP 9 mmHg  PA Systolic Pressure 30 mmHg  PA Diastolic Pressure 12 mmHg  PA Mean 21 mmHg  PW A Wave 18 mmHg  PW V Wave 22 mmHg  PW Mean 14 mmHg  AO Systolic Pressure 706 mmHg  AO Diastolic Pressure 72 mmHg  AO Mean 94 mmHg  LV Systolic Pressure 237 mmHg  LV Diastolic Pressure 5 mmHg  LV EDP 22 mmHg  Arterial Occlusion Pressure Extended Systolic Pressure 628 mmHg  Arterial Occlusion Pressure Extended Diastolic Pressure 69 mmHg  Arterial Occlusion Pressure Extended Mean Pressure 92 mmHg  Left Ventricular Apex Extended Systolic Pressure 315 mmHg  Left Ventricular Apex Extended Diastolic Pressure 6 mmHg  Left Ventricular Apex Extended EDP Pressure 19 mmHg  QP/QS 1  TPVR Index 6.35 HRUI   TSVR Index 28.45 HRUI  PVR SVR Ratio 0.08  TPVR/TSVR Ratio 0.22      Impression:  Patient has mitral valve prolapse with at least stage C and possibly early stage D severe primary mitral regurgitation. He remains essentially asymptomatic although he states that he has noticed a tendency to get some shortness of breath with very strenuous exertion.  I have personally reviewed the patient's recent transthoracic and transesophageal echocardiograms and diagnostic cardiac catheterization. 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 There is an obvious flail segment involving the posterior leaflet with severe mitral regurgitation directed anteriorly around the left atrium. There is normal left ventricular systolic function with mild left ventricular chamber enlargement. Diagnostic cardiac catheterization is notable for the absence of significant coronary artery disease. I feel there is a very high likelihood the patient's valve can be repaired with expectation of a durable long-term result with very low operative risks. The patient will likely be a good candidate for minimally invasive approach for surgery.   Plan:  The patient and his wife were counseled at length regarding the indications, risks and potential benefits of mitral valve repair.  The rationale for elective surgery has been explained, including a comparison between surgery and continued medical therapy with close follow-up.  The likelihood of successful and durable valve repair has been discussed with particular reference to the findings of their recent echocardiogram.  Based upon these findings and previous experience, I have quoted them a greater than 95 percent likelihood of successful valve repair.  The patient understands and accepts all potential risks of surgery including but not limited to risk of death, stroke or other neurologic complication, myocardial  infarction, congestive heart failure, respiratory failure, renal failure, bleeding requiring transfusion and/or reexploration, arrhythmia, infection or other wound complications, pneumonia, pleural and/or pericardial effusion, pulmonary embolus, aortic dissection or other major vascular complication, or delayed complications related to valve repair or replacement including but not limited to structural valve deterioration and failure, thrombosis, embolization, endocarditis, or paravalvular leak.  Alternative surgical approaches have been discussed including a comparison between conventional sternotomy and minimally-invasive techniques.  The relative risks and benefits of each have been reviewed as they pertain to the patient's specific circumstances, and all of their questions have been addressed.  Specific risks potentially related to the minimally-invasive approach were discussed at length, including but not limited to risk of conversion to full or partial sternotomy, aortic dissection or other major vascular complication, unilateral acute lung injury or pulmonary edema, phrenic nerve dysfunction or paralysis, rib fracture, chronic pain, lung hernia, or lymphocele.  Expectations for his postoperative convalescence at been discussed. All of their questions have been answered.  The patient desires to proceed with surgery in early November for a variety of practical reasons. He will return to see [place] in approximately 2 months and make final plans for surgery at that time. We will wait until then to obtain CT angiogram to evaluate the feasibility of peripheral arterial cannulation for surgery.   I spent in excess of 90 minutes during the conduct of this office consultation and >50% of this time involved direct face-to-face encounter with the patient for counseling and/or coordination of their care.   Valentina Gu. Roxy Manns, MD 11/09/2016 5:07 PM

## 2016-11-09 NOTE — Patient Instructions (Signed)
Continue all previous medications without any changes at this time  

## 2016-12-02 ENCOUNTER — Encounter: Payer: Self-pay | Admitting: Adult Health

## 2016-12-21 ENCOUNTER — Other Ambulatory Visit: Payer: Self-pay | Admitting: *Deleted

## 2016-12-21 DIAGNOSIS — I7409 Other arterial embolism and thrombosis of abdominal aorta: Secondary | ICD-10-CM

## 2016-12-21 DIAGNOSIS — I7101 Dissection of thoracic aorta: Secondary | ICD-10-CM

## 2016-12-21 DIAGNOSIS — I71019 Dissection of thoracic aorta, unspecified: Secondary | ICD-10-CM

## 2016-12-21 DIAGNOSIS — Z01818 Encounter for other preprocedural examination: Secondary | ICD-10-CM

## 2016-12-21 DIAGNOSIS — I340 Nonrheumatic mitral (valve) insufficiency: Secondary | ICD-10-CM

## 2016-12-21 DIAGNOSIS — R0602 Shortness of breath: Secondary | ICD-10-CM

## 2017-01-03 ENCOUNTER — Other Ambulatory Visit: Payer: Self-pay | Admitting: Thoracic Surgery (Cardiothoracic Vascular Surgery)

## 2017-01-09 ENCOUNTER — Encounter: Payer: Self-pay | Admitting: Thoracic Surgery (Cardiothoracic Vascular Surgery)

## 2017-01-09 ENCOUNTER — Other Ambulatory Visit: Payer: Self-pay

## 2017-01-09 ENCOUNTER — Ambulatory Visit
Admission: RE | Admit: 2017-01-09 | Discharge: 2017-01-09 | Disposition: A | Discharge status: Home / Self Care | Payer: 59 | Source: Ambulatory Visit | Attending: Thoracic Surgery (Cardiothoracic Vascular Surgery) | Admitting: Thoracic Surgery (Cardiothoracic Vascular Surgery)

## 2017-01-09 ENCOUNTER — Ambulatory Visit (INDEPENDENT_AMBULATORY_CARE_PROVIDER_SITE_OTHER): Payer: 59 | Admitting: Thoracic Surgery (Cardiothoracic Vascular Surgery)

## 2017-01-09 VITALS — BP 130/78 | HR 76 | Resp 16 | Ht 71.0 in | Wt 170.0 lb

## 2017-01-09 DIAGNOSIS — I340 Nonrheumatic mitral (valve) insufficiency: Secondary | ICD-10-CM | POA: Diagnosis not present

## 2017-01-09 DIAGNOSIS — R0602 Shortness of breath: Secondary | ICD-10-CM

## 2017-01-09 DIAGNOSIS — Z01818 Encounter for other preprocedural examination: Secondary | ICD-10-CM

## 2017-01-09 DIAGNOSIS — I7101 Dissection of thoracic aorta: Secondary | ICD-10-CM

## 2017-01-09 DIAGNOSIS — I7409 Other arterial embolism and thrombosis of abdominal aorta: Secondary | ICD-10-CM

## 2017-01-09 DIAGNOSIS — I71019 Dissection of thoracic aorta, unspecified: Secondary | ICD-10-CM

## 2017-01-09 MED ORDER — IOPAMIDOL (ISOVUE-370) INJECTION 76%
75.0000 mL | Freq: Once | INTRAVENOUS | Status: AC | PRN
  Administered 2017-01-09: 75 mL via INTRAVENOUS

## 2017-01-09 NOTE — Progress Notes (Signed)
[address]       [place] [address]             [phone]     CARDIOTHORACIC SURGERY OFFICE NOTE  Referring Provider is [name], [name], MD PCP is Dorothyann Peng, NP   HPI:  Patient returns to the office today for follow-up of mitral valve prolapse with severe symptomatic primary mitral regurgitation. He was initially seen in consultation on 11/09/2016. Since then he has remained clinically stable, although he notes that he has begun to experience a significant change in his exercise tolerance. He states that he gets short of breath with strenuous exertion. Changes are relatively mild, but nonetheless noticeable. He otherwise feels well and he is looking forward to proceeding with elective surgery in the near future.   Current Outpatient Prescriptions  Medication Sig Dispense Refill  . aspirin EC 81 MG tablet Take 81 mg by mouth daily.    . Multiple Vitamin (MULTIVITAMIN) capsule Take 1 capsule by mouth daily.    . Saw Palmetto, Serenoa repens, (SAW PALMETTO PO) Take 1 tablet by mouth daily.     No current facility-administered medications for this visit.       Physical Exam:   BP 130/78 (BP Location: Right Arm, Patient Position: Sitting, Cuff Size: Large)   Pulse 76   Resp 16   Ht 5\' 11"  (1.803 m)   Wt 170 lb (77.1 kg)   SpO2 97% Comment: ON RA  BMI 23.71 kg/m   General:  Well-appearing  Chest:   Clear to auscultation  CV:   Regular rate and rhythm with holosystolic murmur  Incisions:  n/a  Abdomen:  Soft nontender  Extremities:  Warm and well-perfused  Diagnostic Tests:  CT ANGIOGRAPHY CHEST, ABDOMEN AND PELVIS  TECHNIQUE: Multidetector CT imaging through the chest, abdomen and pelvis was performed using the standard protocol during bolus administration of intravenous contrast. Multiplanar reconstructed images and MIPs were obtained and reviewed to evaluate the vascular anatomy.  Creatinine was obtained on site at [facility] at [address]  Results: Creatinine 0.9 mg/dL.  CONTRAST:  75 cc Isovue 370 IV.  COMPARISON:  09/28/2016 chest radiograph.  FINDINGS: CTA CHEST FINDINGS  Cardiovascular: Top-normal heart size. No significant pericardial fluid/thickening. Mildly atherosclerotic nonaneurysmal thoracic aorta. Ascending thoracic aortic diameter 3.3 cm. No significant atherosclerotic plaque in the ascending thoracic aorta. No evidence of acute intramural hematoma, dissection, pseudoaneurysm or penetrating atherosclerotic ulcer in the thoracic aorta. Aortic arch vessels are widely patent. There is a common origin of the innominate and left common carotid arteries from the proximal arch. Normal caliber pulmonary arteries. No central pulmonary emboli.  Mediastinum/Nodes: No discrete thyroid nodules. Unremarkable esophagus. No pathologically enlarged axillary, mediastinal or hilar lymph nodes.  Lungs/Pleura: No pneumothorax. No pleural effusion. No acute consolidative airspace disease or lung masses. Two scattered solid pulmonary nodules in the right lung, largest 5 mm in the right lower lobe (series 5/ image 89).  Musculoskeletal: No aggressive appearing focal osseous lesions. Minimal thoracic spondylosis.  Review of the MIP images confirms the above findings.  CTA ABDOMEN AND PELVIS FINDINGS  VASCULAR  Aorta: Minimally atherosclerotic nonaneurysmal abdominal aorta. No aneurysm, dissection, vasculitis or significant stenosis.  Celiac: Patent without evidence of aneurysm, dissection, vasculitis or significant stenosis.  SMA: Patent without evidence of aneurysm, dissection, vasculitis or significant stenosis.  Renals: Single renal arteries bilaterally. Both renal arteries are patent without evidence of aneurysm, dissection, vasculitis, fibromuscular dysplasia or  significant stenosis.  IMA: Patent without evidence of aneurysm, dissection, vasculitis  or significant stenosis.  Inflow: Minimal iliofemoral atherosclerosis. Patent without evidence of aneurysm, dissection, vasculitis or significant stenosis. Moderate tortuosity of the common and external iliac arteries bilaterally.  Veins: No obvious venous abnormality within the limitations of this arterial phase study.  Review of the MIP images confirms the above findings.  NON-VASCULAR  Hepatobiliary: Normal liver size. A few small simple liver cysts scattered throughout the liver, largest 2.0 cm in the lateral segment left liver lobe. Scattered subcentimeter hypodense lesions throughout the liver are too small to characterize. Normal gallbladder with no radiopaque cholelithiasis. No biliary ductal dilatation.  Pancreas: Normal, with no mass or duct dilation.  Spleen: Normal size. No mass.  Adrenals/Urinary Tract: Normal adrenals. Normal kidneys with no hydronephrosis and no renal mass. Normal bladder.  Stomach/Bowel: Grossly normal stomach. Normal caliber small bowel with no small bowel wall thickening. Normal appendix. Mild sigmoid diverticulosis, with no large bowel wall thickening or pericolonic fat stranding.  Vascular/Lymphatic: Minimally atherosclerotic nonaneurysmal abdominal aorta. No pathologically enlarged lymph nodes in the abdomen or pelvis.  Reproductive: Mildly enlarged prostate with nonspecific internal prostatic calcification.  Other: No pneumoperitoneum, ascites or focal fluid collection.  Musculoskeletal: No aggressive appearing focal osseous lesions. Marked lumbar spondylosis.  Review of the MIP images confirms the above findings.  IMPRESSION: 1. No acute abnormality. No acute aortic syndrome. No aneurysm or significant stenosis. Moderate tortuosity of the bilateral common and external iliac arteries. 2. Mild Aortic Atherosclerosis (ICD10-I70.0). 3. Two solid pulmonary nodules in the right lung, largest 5 mm. No follow-up  needed if patient is low-risk (and has no known or suspected primary neoplasm). Non-contrast chest CT can be considered in 12 months if patient is high-risk. This recommendation follows the consensus statement: Guidelines for Management of Incidental Pulmonary Nodules Detected on CT Images: From the Fleischner Society 2017; Radiology 2017; 284:228-243. 4. Mild sigmoid diverticulosis. 5. Mildly enlarged prostate.   Electronically Signed   By: Ilona Sorrel M.D.   On: 01/09/2017 13:25    Impression:  Patient has mitral valve prolapse with stage D severe symptomatic primary mitral regurgitation. He has recently begun to experience symptoms of exertional shortness of breath, although this occurs only with more strenuous physical exertion.   I have personally reviewed the patient's recent transthoracic and transesophageal echocardiograms and diagnostic cardiac catheterization. 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 There is an obvious flail segment involving the posterior leaflet with severe mitral regurgitation directed anteriorly around the left atrium. There is normal left ventricular systolic function with mild left ventricular chamber enlargement. Diagnostic cardiac catheterization is notable for the absence of significant coronary artery disease. I feel there is a very high likelihood the patient's valve can be repaired with expectation of a durable long-term result with very low operative risks. The patient appears to be a good candidate for minimally invasive approach for surgery.  CT angiography reveals no contraindications to peripheral cannulation for surgery.    Plan:  The patient was again counseled at length regarding the indications, risks and potential benefits of mitral valve repair.  The rationale for elective surgery has been explained, including a comparison between surgery and continued medical  therapy with close follow-up.  The likelihood of successful and durable valve repair has been discussed with particular reference to the findings of their recent echocardiogram.  Based upon these findings and previous experience, I have quoted them a  greater than 95 percent likelihood of successful valve repair.  The patient understands and accepts all potential risks of surgery including but not limited to risk of death, stroke or other neurologic complication, myocardial infarction, congestive heart failure, respiratory failure, renal failure, bleeding requiring transfusion and/or reexploration, arrhythmia, infection or other wound complications, pneumonia, pleural and/or pericardial effusion, pulmonary embolus, aortic dissection or other major vascular complication, or delayed complications related to valve repair or replacement including but not limited to structural valve deterioration and failure, thrombosis, embolization, endocarditis, or paravalvular leak.  Alternative surgical approaches have been discussed including a comparison between conventional sternotomy and minimally-invasive techniques.  The relative risks and benefits of each have been reviewed as they pertain to the patient's specific circumstances, and all of their questions have been addressed.  Specific risks potentially related to the minimally-invasive approach were discussed at length, including but not limited to risk of conversion to full or partial sternotomy, aortic dissection or other major vascular complication, unilateral acute lung injury or pulmonary edema, phrenic nerve dysfunction or paralysis, rib fracture, chronic pain, lung hernia, or lymphocele.  Expectations for his postoperative convalescence at been discussed. All of his questions have been answered.  We plan to proceed with surgery on Tuesday, 01/24/2017.  I spent in excess of 15 minutes during the conduct of this office consultation and >50% of this time involved  direct face-to-face encounter with the patient for counseling and/or coordination of their care.   Valentina Gu. Roxy Manns, MD 01/09/2017 1:44 PM

## 2017-01-09 NOTE — Patient Instructions (Signed)
Stop taking aspirin  Continue taking all other medications without change through the day before surgery.  Have nothing to eat or drink after midnight the night before surgery.

## 2017-01-23 ENCOUNTER — Encounter (HOSPITAL_COMMUNITY): Payer: Self-pay | Admitting: Certified Registered Nurse Anesthetist

## 2017-01-23 ENCOUNTER — Encounter (HOSPITAL_COMMUNITY): Payer: Self-pay

## 2017-01-23 ENCOUNTER — Other Ambulatory Visit: Payer: Self-pay

## 2017-01-23 ENCOUNTER — Ambulatory Visit (HOSPITAL_COMMUNITY)
Admission: RE | Admit: 2017-01-23 | Discharge: 2017-01-23 | Disposition: A | Discharge status: Home / Self Care | Payer: 59 | Source: Ambulatory Visit | Attending: Thoracic Surgery (Cardiothoracic Vascular Surgery) | Admitting: Thoracic Surgery (Cardiothoracic Vascular Surgery)

## 2017-01-23 ENCOUNTER — Ambulatory Visit (HOSPITAL_BASED_OUTPATIENT_CLINIC_OR_DEPARTMENT_OTHER)
Admission: RE | Admit: 2017-01-23 | Discharge: 2017-01-23 | Disposition: A | Discharge status: Home / Self Care | Payer: 59 | Source: Ambulatory Visit | Attending: Thoracic Surgery (Cardiothoracic Vascular Surgery) | Admitting: Thoracic Surgery (Cardiothoracic Vascular Surgery)

## 2017-01-23 ENCOUNTER — Encounter (HOSPITAL_COMMUNITY)
Admission: RE | Admit: 2017-01-23 | Discharge: 2017-01-23 | Disposition: A | Discharge status: Home / Self Care | Payer: 59 | Source: Ambulatory Visit | Attending: Thoracic Surgery (Cardiothoracic Vascular Surgery) | Admitting: Thoracic Surgery (Cardiothoracic Vascular Surgery)

## 2017-01-23 DIAGNOSIS — I341 Nonrheumatic mitral (valve) prolapse: Secondary | ICD-10-CM

## 2017-01-23 DIAGNOSIS — Z0183 Encounter for blood typing: Secondary | ICD-10-CM | POA: Insufficient documentation

## 2017-01-23 DIAGNOSIS — I340 Nonrheumatic mitral (valve) insufficiency: Secondary | ICD-10-CM

## 2017-01-23 DIAGNOSIS — Z7982 Long term (current) use of aspirin: Secondary | ICD-10-CM | POA: Insufficient documentation

## 2017-01-23 DIAGNOSIS — Z01812 Encounter for preprocedural laboratory examination: Secondary | ICD-10-CM | POA: Insufficient documentation

## 2017-01-23 DIAGNOSIS — Z01818 Encounter for other preprocedural examination: Secondary | ICD-10-CM | POA: Insufficient documentation

## 2017-01-23 DIAGNOSIS — R9431 Abnormal electrocardiogram [ECG] [EKG]: Secondary | ICD-10-CM

## 2017-01-23 LAB — TYPE AND SCREEN
ABO/RH(D): O POS
ANTIBODY SCREEN: NEGATIVE

## 2017-01-23 LAB — PULMONARY FUNCTION TEST
DL/VA % pred: 91 %
DL/VA: 4.26 ml/min/mmHg/L
DLCO UNC % PRED: 94 %
DLCO unc: 31.68 ml/min/mmHg
FEF 25-75 PRE: 3.78 L/s
FEF 25-75 Post: 5.28 L/sec
FEF2575-%CHANGE-POST: 39 %
FEF2575-%PRED-POST: 173 %
FEF2575-%Pred-Pre: 124 %
FEV1-%CHANGE-POST: 6 %
FEV1-%Pred-Post: 120 %
FEV1-%Pred-Pre: 113 %
FEV1-POST: 4.49 L
FEV1-PRE: 4.24 L
FEV1FVC-%CHANGE-POST: 6 %
FEV1FVC-%Pred-Pre: 103 %
FEV6-%Change-Post: 0 %
FEV6-%PRED-PRE: 113 %
FEV6-%Pred-Post: 113 %
FEV6-PRE: 5.35 L
FEV6-Post: 5.33 L
FEV6FVC-%Change-Post: 0 %
FEV6FVC-%PRED-PRE: 104 %
FEV6FVC-%Pred-Post: 104 %
FVC-%CHANGE-POST: 0 %
FVC-%PRED-POST: 109 %
FVC-%PRED-PRE: 109 %
FVC-POST: 5.37 L
FVC-PRE: 5.41 L
POST FEV1/FVC RATIO: 84 %
PRE FEV6/FVC RATIO: 99 %
Post FEV6/FVC ratio: 99 %
Pre FEV1/FVC ratio: 78 %
RV % PRED: 115 %
RV: 2.67 L
TLC % pred: 113 %
TLC: 8.15 L

## 2017-01-23 LAB — URINALYSIS, ROUTINE W REFLEX MICROSCOPIC
BILIRUBIN URINE: NEGATIVE
GLUCOSE, UA: NEGATIVE mg/dL
HGB URINE DIPSTICK: NEGATIVE
KETONES UR: NEGATIVE mg/dL
Leukocytes, UA: NEGATIVE
Nitrite: NEGATIVE
PROTEIN: NEGATIVE mg/dL
Specific Gravity, Urine: 1.019 (ref 1.005–1.030)
pH: 6 (ref 5.0–8.0)

## 2017-01-23 LAB — CBC
HEMATOCRIT: 43.6 % (ref 39.0–52.0)
Hemoglobin: 14.4 g/dL (ref 13.0–17.0)
MCH: 28.4 pg (ref 26.0–34.0)
MCHC: 33 g/dL (ref 30.0–36.0)
MCV: 86 fL (ref 78.0–100.0)
PLATELETS: 191 10*3/uL (ref 150–400)
RBC: 5.07 MIL/uL (ref 4.22–5.81)
RDW: 13.2 % (ref 11.5–15.5)
WBC: 6.8 10*3/uL (ref 4.0–10.5)

## 2017-01-23 LAB — COMPREHENSIVE METABOLIC PANEL
ALBUMIN: 3.9 g/dL (ref 3.5–5.0)
ALT: 24 U/L (ref 17–63)
AST: 22 U/L (ref 15–41)
Alkaline Phosphatase: 78 U/L (ref 38–126)
Anion gap: 8 (ref 5–15)
BUN: 14 mg/dL (ref 6–20)
CHLORIDE: 108 mmol/L (ref 101–111)
CO2: 22 mmol/L (ref 22–32)
CREATININE: 0.88 mg/dL (ref 0.61–1.24)
Calcium: 9.3 mg/dL (ref 8.9–10.3)
GFR calc Af Amer: >60 mL/min (ref >60)
GLUCOSE: 105 mg/dL — AB (ref 65–99)
POTASSIUM: 3.7 mmol/L (ref 3.5–5.1)
Sodium: 138 mmol/L (ref 135–145)
Total Bilirubin: 0.8 mg/dL (ref 0.3–1.2)
Total Protein: 7 g/dL (ref 6.5–8.1)

## 2017-01-23 LAB — BLOOD GAS, ARTERIAL
ACID-BASE EXCESS: 1.6 mmol/L (ref 0.0–2.0)
BICARBONATE: 25.7 mmol/L (ref 20.0–28.0)
DRAWN BY: 421801
FIO2: 21
O2 Saturation: 97.9 %
PATIENT TEMPERATURE: 98.6
PH ART: 7.418 (ref 7.350–7.450)
pCO2 arterial: 40.5 mmHg (ref 32.0–48.0)
pO2, Arterial: 103 mmHg (ref 83.0–108.0)

## 2017-01-23 LAB — HEMOGLOBIN A1C
Hgb A1c MFr Bld: 5.3 % (ref 4.8–5.6)
Mean Plasma Glucose: 105.41 mg/dL

## 2017-01-23 LAB — PROTIME-INR
INR: 1
Prothrombin Time: 13.1 seconds (ref 11.4–15.2)

## 2017-01-23 LAB — ABO/RH: ABO/RH(D): O POS

## 2017-01-23 LAB — APTT: aPTT: 27 seconds (ref 24–36)

## 2017-01-23 LAB — SURGICAL PCR SCREEN
MRSA, PCR: NEGATIVE
Staphylococcus aureus: NEGATIVE

## 2017-01-23 MED ORDER — VANCOMYCIN HCL 10 G IV SOLR
1250.0000 mg | INTRAVENOUS | Status: AC
  Administered 2017-01-24: 1250 mg via INTRAVENOUS
  Filled 2017-01-23: qty 1250

## 2017-01-23 MED ORDER — PLASMA-LYTE 148 IV SOLN
INTRAVENOUS | Status: DC
  Filled 2017-01-23: qty 2.5

## 2017-01-23 MED ORDER — MAGNESIUM SULFATE 50 % IJ SOLN
40.0000 meq | INTRAMUSCULAR | Status: DC
  Filled 2017-01-23: qty 9.85

## 2017-01-23 MED ORDER — SODIUM CHLORIDE 0.9 % IV SOLN
INTRAVENOUS | Status: AC
  Administered 2017-01-24: 1 [IU]/h via INTRAVENOUS
  Filled 2017-01-23: qty 1

## 2017-01-23 MED ORDER — DEXTROSE 5 % IV SOLN
750.0000 mg | INTRAVENOUS | Status: DC
  Filled 2017-01-23: qty 750

## 2017-01-23 MED ORDER — EPINEPHRINE PF 1 MG/ML IJ SOLN
0.0000 ug/min | INTRAVENOUS | Status: DC
  Filled 2017-01-23: qty 4

## 2017-01-23 MED ORDER — METOPROLOL TARTRATE 12.5 MG HALF TABLET
12.5000 mg | ORAL_TABLET | Freq: Once | ORAL | Status: AC
  Administered 2017-01-24: 12.5 mg via ORAL
  Filled 2017-01-23: qty 1

## 2017-01-23 MED ORDER — SODIUM CHLORIDE 0.9 % IV SOLN
30.0000 ug/min | INTRAVENOUS | Status: AC
  Administered 2017-01-24: 25 ug/min via INTRAVENOUS
  Filled 2017-01-23: qty 2

## 2017-01-23 MED ORDER — NITROGLYCERIN IN D5W 200-5 MCG/ML-% IV SOLN
2.0000 ug/min | INTRAVENOUS | Status: DC
  Filled 2017-01-23: qty 250

## 2017-01-23 MED ORDER — POTASSIUM CHLORIDE 2 MEQ/ML IV SOLN
80.0000 meq | INTRAVENOUS | Status: DC
  Filled 2017-01-23: qty 40

## 2017-01-23 MED ORDER — DOPAMINE-DEXTROSE 3.2-5 MG/ML-% IV SOLN
0.0000 ug/kg/min | INTRAVENOUS | Status: DC
  Filled 2017-01-23: qty 250

## 2017-01-23 MED ORDER — ALBUTEROL SULFATE (2.5 MG/3ML) 0.083% IN NEBU
2.5000 mg | INHALATION_SOLUTION | Freq: Once | RESPIRATORY_TRACT | Status: AC
  Administered 2017-01-23: 2.5 mg via RESPIRATORY_TRACT

## 2017-01-23 MED ORDER — TRANEXAMIC ACID 1000 MG/10ML IV SOLN
1.5000 mg/kg/h | INTRAVENOUS | Status: AC
  Administered 2017-01-24: 1.5 mg/kg/h via INTRAVENOUS
  Filled 2017-01-23: qty 25

## 2017-01-23 MED ORDER — TRANEXAMIC ACID (OHS) BOLUS VIA INFUSION
15.0000 mg/kg | INTRAVENOUS | Status: AC
  Administered 2017-01-24: 1156.5 mg via INTRAVENOUS
  Filled 2017-01-23: qty 1157

## 2017-01-23 MED ORDER — DEXTROSE 5 % IV SOLN
1.5000 g | INTRAVENOUS | Status: AC
  Administered 2017-01-24: 1.5 g via INTRAVENOUS
  Administered 2017-01-24: .75 g via INTRAVENOUS
  Filled 2017-01-23: qty 1.5

## 2017-01-23 MED ORDER — SODIUM CHLORIDE 0.9 % IV SOLN
INTRAVENOUS | Status: DC
  Filled 2017-01-23: qty 30

## 2017-01-23 MED ORDER — KENNESTONE BLOOD CARDIOPLEGIA (KBC) MANNITOL SYRINGE (20%, 32ML)
32.0000 mL | Freq: Once | INTRAVENOUS | Status: DC
  Filled 2017-01-23: qty 32

## 2017-01-23 MED ORDER — DEXMEDETOMIDINE HCL IN NACL 400 MCG/100ML IV SOLN
0.1000 ug/kg/h | INTRAVENOUS | Status: AC
  Administered 2017-01-24: .3 ug/kg/h via INTRAVENOUS
  Filled 2017-01-23: qty 100

## 2017-01-23 MED ORDER — TRANEXAMIC ACID (OHS) PUMP PRIME SOLUTION
2.0000 mg/kg | INTRAVENOUS | Status: DC
  Filled 2017-01-23: qty 1.54

## 2017-01-23 MED ORDER — CHLORHEXIDINE GLUCONATE 0.12 % MT SOLN
15.0000 mL | Freq: Once | OROMUCOSAL | Status: AC
  Administered 2017-01-24: 15 mL via OROMUCOSAL
  Filled 2017-01-23: qty 15

## 2017-01-23 MED ORDER — MILRINONE LACTATE IN DEXTROSE 20-5 MG/100ML-% IV SOLN
0.1250 ug/kg/min | INTRAVENOUS | Status: DC
  Filled 2017-01-23: qty 100

## 2017-01-23 MED ORDER — KENNESTONE BLOOD CARDIOPLEGIA VIAL
13.0000 mL | Freq: Once | Status: DC
  Filled 2017-01-23 (×2): qty 13

## 2017-01-23 MED ORDER — KENNESTONE BLOOD CARDIOPLEGIA VIAL
13.0000 mL | Freq: Once | Status: DC
  Filled 2017-01-23: qty 13

## 2017-01-23 NOTE — H&P (Signed)
[address]       [place], [address]             [phone]          CARDIOTHORACIC SURGERY HISTORY AND PHYSICAL EXAM  Referring Provider is [name], [name], MD PCP is Dorothyann Peng, NP      Chief Complaint  Patient presents with  . Mitral Regurgitation    eval for MINI MVR.Marland KitchenTEE 09/21/16, CATH 10/28/16.    HPI:  Patient is a 61 year old male with history of mitral valve prolapse and mitral regurgitation referred for surgical consultation to discuss treatment options. The patient has been remarkably healthy and physically active all of his adult life. Approximately 7 or 8 years ago he was noted to have a systolic murmur on physical exam by his primary care physician, and transthoracic echocardiogram revealed the presence of mitral valve prolapse with mitral regurgitation. He was referred to Dr. [name] who has followed him intermittently ever since. Recently the patient's murmur was noted to be more prominent on physical exam and follow-up transthoracic echocardiogram performed 09/08/2016 revealed mitral valve prolapse with what was felt to be moderate to severe mitral regurgitation. Left ventricular systolic function remained normal with ejection fraction estimated 60-65%. The left ventricle was noted to be mildly dilated. The patient subsequently underwent transesophageal echocardiogram and diagnostic cardiac catheterization.  TEE performed 09/21/2016 confirmed the presence of myxomatous degenerative disease of the mitral valve with bileaflet prolapse including a flail segment of the posterior leaflet and severe mitral regurgitation.  Cardiac catheterization performed 10/28/2016 revealed normal coronary arteries with no significant coronary artery disease.  There were normal right heart pressures with mildly elevated left ventricular end-diastolic pressure and normal left ventricular systolic function. The patient was referred for surgical consultation.  The  patient is married and lives locally in [place] with his wife who used to work on the [facility] cardiac surgery team in the operating room.  They have 4 adult children. 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 He has remained fairly active physically although he admits that he does not exercise on a regular basis. He states that with real strenuous exertion such as swimming he has noticed a tendency to get some degree of exertional shortness of breath. However, this only occurs with strenuous exertion and it does not limit his activities at all. He has no exertional shortness of breath with ordinary day-to-day activities. He does not experience any chest pain or chest tightness. He reports occasional palpitations. He has not had any dizziness or syncope.  Patient returns to the office today for follow-up of mitral valve prolapse with severe symptomatic primary mitral regurgitation. He was initially seen in consultation on 11/09/2016. Since then he has remained clinically stable, although he notes that he has begun to experience a significant change in his exercise tolerance. He states that he gets short of breath with strenuous exertion. Changes are relatively mild, but nonetheless noticeable. He otherwise feels well and he is looking forward to proceeding with elective surgery in the near future.   Past Medical History:  Diagnosis Date  . Lumbar disc disease    L4-5  . Murmur 9/92011   echo-MVP posterior leaflet with moderate mitral insufficiency and the jet directed anteriorly,left atrium mildly enlarged  . MVP (mitral valve prolapse)   . Non-rheumatic mitral regurgitation     Past Surgical History:  Procedure Laterality Date  . COLONOSCOPY    .  VASECTOMY    . WISDOM TOOTH EXTRACTION      Family History  Problem Relation Age of Onset  . Kidney failure Father        died  . Kidney disease Father   . Hypertension Mother    . Lung cancer Mother   . Colon cancer Neg Hx     Social History Social History   Tobacco Use  . Smoking status: Never Smoker  . Smokeless tobacco: Never Used  Substance Use Topics  . Alcohol use: No  . Drug use: No    Prior to Admission medications   Medication Sig Start Date End Date Taking? Authorizing Provider  aspirin EC 81 MG tablet Take 81 mg by mouth daily.   Yes [provider]  Multiple Vitamin (MULTIVITAMIN) capsule Take 1 capsule by mouth daily.   Yes [provider]  Saw Palmetto, Serenoa repens, (SAW PALMETTO PO) Take 1 tablet by mouth daily.   Yes [provider]    Allergies  Allergen Reactions  . Bee Venom Anaphylaxis     Review of Systems:              General:                      normal appetite, normal energy, no weight gain, no weight loss, no fever             Cardiac:                       no chest pain with exertion, no chest pain at rest, + SOB with very strenuous exertion, no resting SOB, no PND, no orthopnea, no palpitations, no arrhythmia, no atrial fibrillation, no LE edema, no dizzy spells, no syncope             Respiratory:                 no shortness of breath, no home oxygen, no productive cough, no dry cough, no bronchitis, no wheezing, no hemoptysis, no asthma, no pain with inspiration or cough, no sleep apnea, no CPAP at night             GI:                               no difficulty swallowing, no reflux, no frequent heartburn, no hiatal hernia, no abdominal pain, no constipation, no diarrhea, no hematochezia, no hematemesis, no melena             GU:                              no dysuria,  no frequency, no urinary tract infection, no hematuria, no enlarged prostate, no kidney stones, no kidney disease             Vascular:                     no pain suggestive of claudication, no pain in feet, no leg cramps, no varicose veins, no DVT, no non-healing foot ulcer             Neuro:                         no  stroke, no TIA's, no seizures, no headaches,  no temporary blindness one eye,  no slurred speech, no peripheral neuropathy, no chronic pain, no instability of gait, no memory/cognitive dysfunction             Musculoskeletal:         no arthritis, no joint swelling, no myalgias, no difficulty walking, normal mobility              Skin:                            no rash, no itching, no skin infections, no pressure sores or ulcerations             Psych:                         no anxiety, no depression, no nervousness, no unusual recent stress             Eyes:                           no blurry vision, no floaters, no recent vision changes, does not wears glasses or contacts             ENT:                            no hearing loss, no loose or painful teeth, no dentures, last saw dentist within the past year             Hematologic:               no easy bruising, no abnormal bleeding, no clotting disorder, no frequent epistaxis             Endocrine:                   no diabetes, does not check CBG's at home                           Physical Exam:              BP 138/80 (BP Location: Left Arm, Patient Position: Sitting, Cuff Size: Large)   Pulse 68   Resp 16   Ht 5\' 10"  (1.778 m)   Wt 172 lb (78 kg)   SpO2 95% Comment: ON RA  BMI 24.68 kg/m              General:                      Thin,  well-appearing             HEENT:                       Unremarkable              Neck:                           no JVD, no bruits, no adenopathy              Chest:                          clear to auscultation, symmetrical breath sounds, no wheezes, no rhonchi  CV:                              RRR, grade IV/VI holosystolic murmur heard all across the precordium w/ radiation to axilla and back             Abdomen:                    soft, non-tender, no masses              Extremities:                 warm, well-perfused, pulses palpable, no LE edema             Rectal/GU                    Deferred             Neuro:                         Grossly non-focal and symmetrical throughout             Skin:                            Clean and dry, no rashes, no breakdown   Diagnostic Tests:  Transthoracic Echocardiography  Patient: Alan, Mcguire MR #: [id number] Study Date: 09/08/2016 Gender: M Age: 8 Height: 177.8 cm Weight: 76.8 kg BSA: 1.95 m^2 Pt. Status: Room:  ATTENDING Peter [name], M.D. ORDERING Peter [name], M.D. REFERRING Peter [name], M.D. SONOGRAPHER Wyatt Mage, RDCS PERFORMING [facility] Outpatient  cc:  ------------------------------------------------------------------- LV EF: 60% - 65%  ------------------------------------------------------------------- Indications: Mitral valve prolapse (I34.1).  ------------------------------------------------------------------- History: PMH: Murmur. Mitral valve prolapse.  ------------------------------------------------------------------- Study Conclusions  - Left ventricle: The cavity size was mildly dilated. Systolic function was normal. The estimated ejection fraction was in the range of 60% to 65%. Wall motion was normal; there were no regional wall motion abnormalities. Features are consistent with a pseudonormal left ventricular filling pattern, with concomitant abnormal relaxation and increased filling pressure (grade 2 diastolic dysfunction). Doppler parameters are consistent with elevated ventricular end-diastolic filling pressure. - Aortic valve: There was mild regurgitation. - Mitral valve: There was moderate to severe regurgitation. - Left atrium: The atrium was moderately dilated. - Right ventricle: Systolic function was normal. - Tricuspid valve: There was moderate regurgitation. - Pulmonary arteries: Systolic pressure was mildly increased. PA peak pressure: 43 mm Hg  (S).  Impressions:  - Myxomatous, thickened mitral valve leaflets with bileaflet prolapse posterior >>anterior. There is anteriorly directed jet, mitral regurgitation is at least moderate to severe, most probably severe, there is systolic anterior flow reversal in the pulmonary vein, moderate pulmonary hypertension. A TEE is recommended for further evaluation.  ------------------------------------------------------------------- Labs, prior tests, procedures, and surgery: Transthoracic echocardiography (05/25/2011). The mitral valve showed moderate regurgitation. EF was 60%.  ------------------------------------------------------------------- Study data: Comparison was made to the study of 05/25/2011. Study status: Routine. Procedure: The patient reported no pain pre or post test. Transthoracic echocardiography. Image quality was adequate. Study completion: There were no complications. Transthoracic echocardiography. M-mode, complete 2D, 3D, spectral Doppler, and color Doppler. Birthdate: Patient birthdate: 1956/01/07. Age: Patient is 61 yr old. Sex: Gender: male. BMI: 24.3 kg/m^2. Blood pressure: 118/64 Patient status: Outpatient. Study date: Study date: 09/08/2016. Study time: 01:01 PM. Location: [facility]  -------------------------------------------------------------------  -------------------------------------------------------------------  Left ventricle: The cavity size was mildly dilated. Systolic function was normal. The estimated ejection fraction was in the range of 60% to 65%. Wall motion was normal; there were no regional wall motion abnormalities. Features are consistent with a pseudonormal left ventricular filling pattern, with concomitant abnormal relaxation and increased filling pressure (grade 2 diastolic dysfunction). Doppler parameters are consistent with elevated ventricular end-diastolic filling  pressure.  ------------------------------------------------------------------- Aortic valve: Trileaflet; normal thickness leaflets. Mobility was not restricted. Doppler: Transvalvular velocity was within the normal range. There was no stenosis. There was mild regurgitation.  ------------------------------------------------------------------- Aorta: Aortic root: The aortic root was normal in size.  ------------------------------------------------------------------- Mitral valve: Myxomatous, thickened leaflets with bileaflet prolapse posterior >> anterior. Mobility was not restricted. Doppler: Transvalvular velocity was within the normal range. There was no evidence for stenosis. There was moderate to severe regurgitation. Valve area by pressure half-time: 3.1 cm^2. Indexed valve area by pressure half-time: 1.59 cm^2/m^2. Valve area by continuity equation (using LVOT flow): 1.24 cm^2. Indexed valve area by continuity equation (using LVOT flow): 0.63 cm^2/m^2. Mean gradient (D): 5 mm Hg. Peak gradient (D): 12 mm Hg.  ------------------------------------------------------------------- Left atrium: The atrium was moderately dilated.  ------------------------------------------------------------------- Right ventricle: The cavity size was normal. Wall thickness was normal. Systolic function was normal.  ------------------------------------------------------------------- Pulmonic valve: Structurally normal valve. Cusp separation was normal. Doppler: Transvalvular velocity was within the normal range. There was no evidence for stenosis. There was no regurgitation.  ------------------------------------------------------------------- Tricuspid valve: Structurally normal valve. Doppler: Transvalvular velocity was within the normal range. There was moderate regurgitation.  ------------------------------------------------------------------- Pulmonary artery:  The main pulmonary artery was normal-sized. Systolic pressure was mildly increased.  ------------------------------------------------------------------- Right atrium: The atrium was normal in size.  ------------------------------------------------------------------- Pericardium: There was no pericardial effusion.  ------------------------------------------------------------------- Systemic veins: Inferior vena cava: The vessel was normal in size.  ------------------------------------------------------------------- Measurements  Left ventricle Value Reference LV ID, ED, PLAX chordal (H) 57 mm 43 - 52 LV ID, ES, PLAX chordal 37 mm 23 - 38 LV fx shortening, PLAX chordal 35 % >=29 LV PW thickness, ED 10 mm --------- IVS/LV PW ratio, ED 1 <=1.3 Stroke volume, 2D 54 ml --------- Stroke volume/bsa, 2D 28 ml/m^2 --------- LV e&', lateral 7.18 cm/s --------- LV E/e&', lateral 24.37 --------- LV e&', medial 9.14 cm/s --------- LV E/e&', medial 19.15 --------- LV e&', average 8.16 cm/s --------- LV E/e&', average 21.45 ---------  Ventricular septum Value Reference IVS thickness, ED 10 mm ---------  LVOT Value Reference LVOT ID, S 21 mm --------- LVOT area 3.46 cm^2 --------- LVOT peak velocity, S 101 cm/s --------- LVOT mean  velocity, S 57.8 cm/s --------- LVOT VTI, S 15.6 cm ---------  Aortic valve Value Reference Aortic regurg pressure half-time 541 ms ---------  Aorta Value Reference Aortic root ID, ED 33 mm --------- Ascending aorta ID, A-P, S 33 mm ---------  Left atrium Value Reference LA ID, A-P, ES 49 mm --------- LA ID/bsa, A-P (H) 2.51 cm/m^2 <=2.2 LA volume, S 118 ml --------- LA volume/bsa, S 60.4 ml/m^2 --------- LA volume, ES, 1-p A4C 97.4 ml --------- LA volume/bsa, ES, 1-p A4C 49.8 ml/m^2 --------- LA volume, ES, 1-p A2C 131 ml --------- LA volume/bsa, ES, 1-p A2C 67 ml/m^2 ---------  Mitral valve Value Reference Mitral E-wave peak velocity 175 cm/s --------- Mitral A-wave peak velocity 60 cm/s --------- Mitral mean velocity, D 93 cm/s --------- Mitral deceleration time (L) 137 ms 150 - 230 Mitral pressure half-time 101 ms --------- Mitral mean gradient, D 5 mm Hg --------- Mitral  peak gradient, D 12 mm Hg --------- Mitral E/A ratio, peak 2.9 --------- Mitral valve area, PHT, DP 3.1 cm^2 --------- Mitral valve area/bsa, PHT, DP 1.59 cm^2/m^2 --------- Mitral valve area, LVOT 1.24 cm^2 --------- continuity Mitral  valve area/bsa, LVOT 0.63 cm^2/m^2 --------- continuity Mitral annulus VTI, D 43.7 cm --------- Mitral regurg VTI, PISA 154 cm --------- Mitral ERO, PISA 1.49 cm^2 --------- Mitral regurg volume, PISA 229 ml ---------  Pulmonary arteries Value Reference PA pressure, S, DP (H) 43 mm Hg <=30  Tricuspid valve Value Reference Tricuspid regurg peak velocity 295 cm/s --------- Tricuspid peak RV-RA gradient 35 mm Hg ---------  Systemic veins Value Reference Estimated CVP 8 mm Hg ---------  Right ventricle Value Reference TAPSE 26.4 mm --------- RV pressure, S, DP (H) 43 mm Hg <=30 RV s&', lateral, S 13.6 cm/s ---------  Legend: (L) and (H) mark values outside specified reference range.  ------------------------------------------------------------------- Prepared and Electronically Authenticated by  Ena Dawley, M.D. 2018-06-28T14:27:18   Transesophageal Echocardiography  Patient: Alan Mcguire, Alan Mcguire MR #: [id number] Study Date: 09/21/2016 Gender: M Age: 35 Height: 177.8 cm Weight: 76.8 kg BSA: 1.95 m^2 Pt. Status: Room:  ATTENDING Peter [name], M.D. ORDERING Peter [name], M.D. REFERRING Peter [name], M.D. ADMITTING Skeet Latch, MD PERFORMING Skeet Latch, MD SONOGRAPHER Mikki Santee  cc:  ------------------------------------------------------------------- LV EF: 60% -  65%  ------------------------------------------------------------------- Indications: Mitral regurgitation 424.0.  ------------------------------------------------------------------- History: PMH: Mitral valve prolapse.  ------------------------------------------------------------------- Study Conclusions  - Left ventricle: Systolic function was normal. The estimated ejection fraction was in the range of 60% to 65%. Wall motion was normal; there were no regional wall motion abnormalities. - Aortic valve: There was mild regurgitation. - Mitral valve: Severe thickening of the anterior leaflet and posterior leaflet, consistent with myxomatous proliferation. Severe, late systolic prolapse, involving the posterior leaflet and mild prolapse of the anterior leaflet. There was severe, mild-late systolic regurgitation directed eccentrically and toward the septum. Severe regurgitation is suggested by an effective regurgitant orifice >= 0.40 cm^2, a regurgitant volume >= 60 ml, pulmonary vein systolic flow reversal, and Coanda effect. Late systolic flow reversal was noted in the right upper pulmonary vein. Valve area by continuity equation (using LVOT flow): 1.78 cm^2. - Left atrium: No evidence of thrombus in the atrial cavity or appendage. No evidence of thrombus in the atrial cavity or appendage. - Right atrium: No evidence of thrombus in the atrial cavity or appendage. - Atrial septum: No defect or patent foramen ovale was identified by color flow Doppler.  ------------------------------------------------------------------- Study data: Study status: Routine. Consent: The risks, benefits, and alternatives to the procedure were explained to the patient and informed consent was obtained. Procedure: The patient reported no pain pre or post test. Initial setup. The patient was brought to the laboratory. Surface ECG leads were  monitored. Sedation. Conscious sedation was administered by cardiology staff. Transesophageal echocardiography. Topical anesthesia was obtained using viscous lidocaine. A transesophageal probe was inserted by the attending cardiologist. Image quality was adequate. Study completion: The patient tolerated the procedure well. There were no complications. Administered medications: Fentanyl, 75mcg. Midazolam, 5mg . Diagnostic transesophageal echocardiography. 2D and color Doppler. Birthdate: Patient birthdate: 1955/08/03. Age: Patient is 61 yr old. Sex: Gender: male. BMI: 24.3 kg/m^2. Blood pressure: 148/85 Patient status: Outpatient. Study date: Study date: 09/21/2016. Study time: 10:56 AM. Location: Endoscopy.  -------------------------------------------------------------------  ------------------------------------------------------------------- Left ventricle: Systolic function was normal. The estimated ejection fraction was in the range of 60% to 65%. Wall motion was normal; there were no regional wall motion abnormalities.  -------------------------------------------------------------------  Aortic valve: Structurally normal valve. Trileaflet; normal thickness leaflets. Cusp separation was normal. Doppler: There was mild regurgitation.  ------------------------------------------------------------------- Aorta: There was no atheroma. There was no evidence for dissection. Aortic root: The aortic root was not dilated. Ascending aorta: The ascending aorta was normal in size. Aortic arch: The aortic arch was normal in size. Descending aorta: The descending aorta was normal in size.  ------------------------------------------------------------------- Mitral valve: Severe thickening of the anterior leaflet and posterior leaflet, consistent with myxomatous proliferation. Leaflet separation was normal. Severe, late systolic prolapse, involving  the posterior leaflet and mild prolapse of the anterior leaflet. Doppler: There was severe, mild-late systolic regurgitation directed eccentrically and toward the septum. Severe regurgitation is suggested by an effective regurgitant orifice >= 0.40 cm^2, a regurgitant volume >= 60 ml, pulmonary vein systolic flow reversal, and Coanda effect. Late systolic flow reversal was noted in the right upper pulmonary vein. Valve area by continuity equation (using LVOT flow): 1.78 cm^2. Indexed valve area by continuity equation (using LVOT flow): 0.91 cm^2/m^2. Mean gradient (D): 4 mm Hg.  ------------------------------------------------------------------- Left atrium: The atrium was normal in size. No evidence of thrombus in the atrial cavity or appendage. No evidence of thrombus in the atrial cavity or appendage. The appendage was morphologically a left appendage, multilobulated, and of normal size. Emptying velocity was normal.  ------------------------------------------------------------------- Atrial septum: No defect or patent foramen ovale was identified by color flow Doppler.  ------------------------------------------------------------------- Right ventricle: The cavity size was normal. Wall thickness was normal. Systolic function was normal.  ------------------------------------------------------------------- Pulmonic valve: Structurally normal valve.  ------------------------------------------------------------------- Tricuspid valve: Structurally normal valve. Leaflet separation was normal. Doppler: There was mild regurgitation.  ------------------------------------------------------------------- Pulmonary artery: The main pulmonary artery was normal-sized.  ------------------------------------------------------------------- Right atrium: The atrium was normal in size. No evidence of thrombus in the atrial cavity or appendage. The appendage  was morphologically a right appendage.  ------------------------------------------------------------------- Pericardium: There was no pericardial effusion.  ------------------------------------------------------------------- Measurements  Left ventricle Value Stroke volume, 2D 74 ml Stroke volume/bsa, 2D 38 ml/m^2  LVOT Value LVOT ID, S 22.8 mm LVOT area 4.08 cm^2 LVOT peak velocity, S 111 cm/s LVOT mean velocity, S 57.9 cm/s LVOT VTI, S 17.8 cm LVOT peak gradient, S 5 mm Hg Stroke volume (SV), LVOT DP 72.7 ml Stroke index (SV/bsa), LVOT DP 37.2 ml/m^2  Mitral valve Value Mitral annulus diameter, A-P 44.4 mm Mitral mean velocity, D 90.1 cm/s Mitral mean gradient, D 4 mm Hg Mitral valve area, LVOT continuity 1.78 cm^2 Mitral valve area/bsa, LVOT continuity 0.91 cm^2/m^2 Mitral annulus VTI, D 41.6 cm Mitral transvalvular flow, D 518.9 ml Mitral regurg volume, LVOT continuity 446.2 ml Mitral regurg fraction, LVOT continuity 86 % Mitral maximal regurg velocity, PISA 527 cm/s Mitral regurg VTI, PISA 92.8 cm Mitral ERO, PISA 1.28 cm^2 Mitral regurg volume, PISA 119 ml Mitral regurg fraction, PISA 62.09 %  Tricuspid valve Value Tricuspid regurg peak velocity 251 cm/s Tricuspid  peak RV-RA gradient 25 mm Hg  Legend: (L) and (H) mark values outside specified reference range.  ------------------------------------------------------------------- Prepared and Electronically Authenticated by  Skeet Latch, MD 2018-07-11T19:45:41   Right/Left Heart Cath and Coronary Angiography  Conclusion     The left ventricular systolic function is normal.  The left ventricular ejection fraction is 55-65% by visual estimate.  There is no aortic valve stenosis.  There is severe (4+) mitral regurgitation and moderate mitral valve prolapse.  Normal coronary anatomy  1. Normal coronary anatomy 2. Normal LV function 3. Normal right heart pressures. 4.  Normal PCWP with mildly elevated LVEDP 5. Normal cardiac output.   Plan: consider for MV repair.    Indications   Non-rheumatic mitral regurgitation [I34.0 (ICD-10-CM)]  Procedural Details/Technique   Technical Details Indication: 61 yo WM with MVP and severe MR.  Procedural Details: The right wrist was prepped, draped, and anesthetized with 1% lidocaine. Using the modified Seldinger technique a 6 Fr slender sheath was placed in the right radial artery and a 5 French sheath was placed in the right brachial vein. A Swan-Ganz catheter was used for the right heart catheterization. Standard protocol was followed for recording of right heart pressures and sampling of oxygen saturations. Fick cardiac output was calculated. Standard Judkins catheters were used for selective coronary angiography and left ventriculography. There were no immediate procedural complications. The patient was transferred to the post catheterization recovery area for further monitoring. Contrast: 60 cc   Estimated blood loss <50 mL.  During this procedure the patient was administered the following to achieve and maintain moderate conscious sedation: Versed 1 mg, Fentanyl 25 mcg, while the patient's heart rate, blood  pressure, and oxygen saturation were continuously monitored. The period of conscious sedation was 60 minutes, of which I was present face-to-face 100% of this time.    Complications   Complications documented before study signed (10/28/2016 8:32 AM EDT)    No complications were associated with this study.  Documented by [name], [name], MD - 10/28/2016 8:17 AM EDT    Coronary Findings   Dominance: Right  Left Main  Vessel was injected. Vessel is normal in caliber. Vessel is angiographically normal.  Left Anterior Descending  Vessel was injected. Vessel is normal in caliber. Vessel is angiographically normal.  Left Circumflex  Vessel was injected. Vessel is normal in caliber. Vessel is angiographically normal.  Right Coronary Artery  Vessel was injected. Vessel is normal in caliber. Vessel is angiographically normal.  Right Heart   Right Heart Pressures Elevated LV EDP consistent with volume overload.    Wall Motion              Left Heart   Left Ventricle The left ventricular size is normal. The left ventricular systolic function is normal. LV end diastolic pressure is normal. The left ventricular ejection fraction is 55-65% by visual estimate. No regional wall motion abnormalities.    Mitral Valve There is severe (4+) mitral regurgitation and moderate mitral valve prolapse.    Aortic Valve There is no aortic valve stenosis.    Coronary Diagrams   Diagnostic Diagram       Implants     No implant documentation for this case.  PACS Images   Show images for Cardiac catheterization   Link to Procedure Log   Procedure Log    Hemo Data    Most Recent Value  Fick Cardiac Output 6.41 L/min  Fick Cardiac Output Index 3.3 (L/min)/BSA  RA A Wave 12 mmHg  RA V Wave 7 mmHg  RA Mean 5 mmHg  RV Systolic Pressure 34 mmHg  RV Diastolic Pressure 2 mmHg  RV EDP 9 mmHg  PA Systolic Pressure 30 mmHg  PA Diastolic Pressure 12 mmHg  PA Mean 21 mmHg  PW A  Wave 18 mmHg  PW V Wave 22 mmHg  PW Mean 14 mmHg  AO Systolic Pressure 604 mmHg  AO Diastolic Pressure 72 mmHg  AO Mean 94 mmHg  LV Systolic Pressure 540 mmHg  LV Diastolic Pressure 5 mmHg  LV EDP 22 mmHg  Arterial  Occlusion Pressure Extended Systolic Pressure 643 mmHg  Arterial Occlusion Pressure Extended Diastolic Pressure 69 mmHg  Arterial Occlusion Pressure Extended Mean Pressure 92 mmHg  Left Ventricular Apex Extended Systolic Pressure 838 mmHg  Left Ventricular Apex Extended Diastolic Pressure 6 mmHg  Left Ventricular Apex Extended EDP Pressure 19 mmHg  QP/QS 1  TPVR Index 6.35 HRUI  TSVR Index 28.45 HRUI  PVR SVR Ratio 0.08  TPVR/TSVR Ratio 0.22     CT ANGIOGRAPHY CHEST, ABDOMEN AND PELVIS  TECHNIQUE: Multidetector CT imaging through the chest, abdomen and pelvis was performed using the standard protocol during bolus administration of intravenous contrast. Multiplanar reconstructed images and MIPs were obtained and reviewed to evaluate the vascular anatomy.  Creatinine was obtained on site at [facility] at [address]  Results: Creatinine 0.9 mg/dL.  CONTRAST: 75 cc Isovue 370 IV.  COMPARISON: 09/28/2016 chest radiograph.  FINDINGS: CTA CHEST FINDINGS  Cardiovascular: Top-normal heart size. No significant pericardial fluid/thickening. Mildly atherosclerotic nonaneurysmal thoracic aorta. Ascending thoracic aortic diameter 3.3 cm. No significant atherosclerotic plaque in the ascending thoracic aorta. No evidence of acute intramural hematoma, dissection, pseudoaneurysm or penetrating atherosclerotic ulcer in the thoracic aorta. Aortic arch vessels are widely patent. There is a common origin of the innominate and left common carotid arteries from the proximal arch. Normal caliber pulmonary arteries. No central pulmonary emboli.  Mediastinum/Nodes: No discrete thyroid nodules. Unremarkable esophagus. No pathologically enlarged  axillary, mediastinal or hilar lymph nodes.  Lungs/Pleura: No pneumothorax. No pleural effusion. No acute consolidative airspace disease or lung masses. Two scattered solid pulmonary nodules in the right lung, largest 5 mm in the right lower lobe (series 5/ image 89).  Musculoskeletal: No aggressive appearing focal osseous lesions. Minimal thoracic spondylosis.  Review of the MIP images confirms the above findings.  CTA ABDOMEN AND PELVIS FINDINGS  VASCULAR  Aorta: Minimally atherosclerotic nonaneurysmal abdominal aorta. No aneurysm, dissection, vasculitis or significant stenosis.  Celiac: Patent without evidence of aneurysm, dissection, vasculitis or significant stenosis.  SMA: Patent without evidence of aneurysm, dissection, vasculitis or significant stenosis.  Renals: Single renal arteries bilaterally. Both renal arteries are patent without evidence of aneurysm, dissection, vasculitis, fibromuscular dysplasia or significant stenosis.  IMA: Patent without evidence of aneurysm, dissection, vasculitis or significant stenosis.  Inflow: Minimal iliofemoral atherosclerosis. Patent without evidence of aneurysm, dissection, vasculitis or significant stenosis. Moderate tortuosity of the common and external iliac arteries bilaterally.  Veins: No obvious venous abnormality within the limitations of this arterial phase study.  Review of the MIP images confirms the above findings.  NON-VASCULAR  Hepatobiliary: Normal liver size. A few small simple liver cysts scattered throughout the liver, largest 2.0 cm in the lateral segment left liver lobe. Scattered subcentimeter hypodense lesions throughout the liver are too small to characterize. Normal gallbladder with no radiopaque cholelithiasis. No biliary ductal dilatation.  Pancreas: Normal, with no mass or duct dilation.  Spleen: Normal size. No mass.  Adrenals/Urinary Tract: Normal adrenals. Normal  kidneys with no hydronephrosis and no renal mass. Normal bladder.  Stomach/Bowel: Grossly normal stomach. Normal caliber small bowel with no small bowel wall thickening. Normal appendix. Mild sigmoid diverticulosis, with no large bowel wall thickening or pericolonic fat stranding.  Vascular/Lymphatic: Minimally atherosclerotic nonaneurysmal abdominal aorta. No pathologically enlarged lymph nodes in the abdomen or pelvis.  Reproductive: Mildly enlarged prostate with nonspecific internal prostatic calcification.  Other: No pneumoperitoneum, ascites or focal fluid collection.  Musculoskeletal: No aggressive appearing focal osseous lesions. Marked lumbar spondylosis.  Review of the MIP  images confirms the above findings.  IMPRESSION: 1. No acute abnormality. No acute aortic syndrome. No aneurysm or significant stenosis. Moderate tortuosity of the bilateral common and external iliac arteries. 2. Mild Aortic Atherosclerosis (ICD10-I70.0). 3. Two solid pulmonary nodules in the right lung, largest 5 mm. No follow-up needed if patient is low-risk (and has no known or suspected primary neoplasm). Non-contrast chest CT can be considered in 12 months if patient is high-risk. This recommendation follows the consensus statement: Guidelines for Management of Incidental Pulmonary Nodules Detected on CT Images: From the Fleischner Society 2017; Radiology 2017; 284:228-243. 4. Mild sigmoid diverticulosis. 5. Mildly enlarged prostate.   Electronically Signed By: Ilona Sorrel M.D. On: 01/09/2017 13:25    Impression:  Patient has mitral valve prolapse with stage D severe symptomatic primary mitral regurgitation. He has recently begun to experience symptoms of exertional shortness of breath, although this occurs only with more strenuous physical exertion.  I have personally reviewed the patient's recent transthoracic and transesophageal echocardiograms and diagnostic cardiac  catheterization. 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 There is an obvious flail segment involving the posterior leaflet with severe mitral regurgitation directed anteriorly around the left atrium. There is normal left ventricular systolic function with mild left ventricular chamber enlargement. Diagnostic cardiac catheterization is notable for the absence of significant coronary artery disease. I feel there is a very high likelihood the patient's valve can be repaired with expectation of adurable long-term result with very low operative risks. The patient appears to be a good candidate for minimally invasive approach for surgery.  CT angiography reveals no contraindications to peripheral cannulation for surgery.    Plan:  The patient was againcounseled at length regarding the indications, risks and potential benefits of mitral valve repair. The rationale for elective surgery has been explained, including a comparison between surgery and continued medical therapy with close follow-up. The likelihood of successful and durable valve repair has been discussed with particular reference to the findings of their recent echocardiogram. Based upon these findings and previous experience, I have quoted them a greater than 95percent likelihood of successful valve repair. The patient understands and accepts all potential risks of surgery including but not limited to risk of death, stroke or other neurologic complication, myocardial infarction, congestive heart failure, respiratory failure, renal failure, bleeding requiring transfusion and/or reexploration, arrhythmia, infection or other wound complications, pneumonia, pleural and/or pericardial effusion, pulmonary embolus, aortic dissection or other major vascular complication, or delayed complications related to valve repair or replacement including but not limited to structural  valve deterioration and failure, thrombosis, embolization, endocarditis, or paravalvular leak. Alternative surgical approaches have been discussed including a comparison between conventional sternotomy and minimally-invasive techniques. The relative risks and benefits of each have been reviewed as they pertain to the patient's specific circumstances, and all of their questions have been addressed. Specific risks potentially related to the minimally-invasive approach were discussed at length, including but not limited to risk of conversion to full or partial sternotomy, aortic dissection or other major vascular complication, unilateral acute lung injury or pulmonary edema, phrenic nerve dysfunction or paralysis, rib fracture, chronic pain, lung hernia, or lymphocele. Expectations for his postoperative convalescence at been discussed. All of his questions have been answered.  We plan to proceed with surgery on Tuesday, 01/24/2017.    Valentina Gu. Roxy Manns, MD 01/09/2017 1:44 PM

## 2017-01-23 NOTE — Progress Notes (Signed)
Vascular prelim: Carotid duplex: 1-39% ICA stenosis. UE Doppler: right decreases >50% with radial compression, normal with ulnar compression, left WNL with radial and ulnar compression. Landry Mellow, RDMS, RVT

## 2017-01-23 NOTE — Pre-Procedure Instructions (Addendum)
Alan Mcguire  01/23/2017      [id number] - [place], [place] - [address] AT [address] [place] [address] Phone: [phone] Fax: [phone]  [facility], [facility] - [place], [place] - [address] [place] [address] Phone: [phone] Fax: [phone]    Your procedure is scheduled on 01/24/17  Report to [facility] at [facility].  Call this number if you have problems the morning of surgery:  [phone]   Remember:  Do not eat food or drink liquids after midnight.  Take these medicines the morning of surgery with A SIP OF WATER     none  STOP all herbel meds, nsaids (aleve,naproxen,advil,ibuprofen)   Today including all vitamins/supplements,saw palmetto  Stop aspirin per dr   Lazaro Arms not wear jewelry, make-up or nail polish.  Do not wear lotions, powders, or perfumes, or deoderant.  Do not shave 48 hours prior to surgery.  Men may shave face and neck.  Do not bring valuables to the hospital.  [facility] is not responsible for any belongings or valuables.  Contacts, dentures or bridgework may not be worn into surgery.  Leave your suitcase in the car.  After surgery it may be brought to your room.  For patients admitted to the hospital, discharge time will be determined by your treatment team.  Patients discharged the day of surgery will not be allowed to drive home.   Special instructions:   Special Instructions: [facility] - Preparing for Surgery  Before surgery, you can play an important role.  Because skin is not sterile, your skin needs to be as free of germs as possible.  You can reduce the number of germs on you skin by washing with CHG (chlorahexidine gluconate) soap before surgery.  CHG is an antiseptic cleaner which kills germs and bonds with the skin to continue killing germs even after washing.  Please DO NOT use if you have an allergy to CHG  or antibacterial soaps.  If your skin becomes reddened/irritated stop using the CHG and inform your nurse when you arrive at Short Stay.  Do not shave (including legs and underarms) for at least 48 hours prior to the first CHG shower.  You may shave your face.  Please follow these instructions carefully:   1.  Shower with CHG Soap the night before surgery and the morning of Surgery.  2.  If you choose to wash your hair, wash your hair first as usual with your normal shampoo.  3.  After you shampoo, rinse your hair and body thoroughly to remove the Shampoo.  4.  Use CHG as you would any other liquid soap.  You can apply chg directly  to the skin and wash gently with scrungie or a clean washcloth.  5.  Apply the CHG Soap to your body ONLY FROM THE NECK DOWN.  Do not use on open wounds or open sores.  Avoid contact with your eyes ears, mouth and genitals (private parts).  Wash genitals (private parts)       with your normal soap.  6.  Wash thoroughly, paying special attention to the area where your surgery will be performed.  7.  Thoroughly rinse your body with warm water from the neck down.  8.  DO NOT shower/wash with your normal soap after using and rinsing off the CHG Soap.  9.  Pat yourself dry with a clean towel.  10.  Wear clean pajamas.            11.  Place clean sheets on your bed the night of your first shower and do not sleep with pets.  Day of Surgery  Do not apply any lotions/deodorants the morning of surgery.  Please wear clean clothes to the hospital/surgery center.  Please read over the  fact sheets that you were given.

## 2017-01-23 NOTE — Progress Notes (Signed)
Anesthesia Chart Review: Patient is a 61 year old male scheduled for minimally invasive mitral valve repair on 01/24/17 by Dr. Darylene Price.  History includes never smoker, MVP with severe MR.  PCP is Dr. Dorothyann Peng, NP. Cardiologist is Dr. Peter [name].  Meds include ASA 81 mg, MVI, saw palmetto.  BP (!) 149/77   Pulse 74   Temp 36.9 C   Resp 18   Ht 5\' 11"  (1.803 m)   Wt 180 lb (81.6 kg)   SpO2 98%   BMI 25.10 kg/m   EKG 01/23/17: NSR, minimal voltage criteria for LVH, may be normal variant. Non-specific ST abnormality.   Cardiac cath 10/28/16:  The left ventricular systolic function is normal.  The left ventricular ejection fraction is 55-65% by visual estimate.  There is no aortic valve stenosis.  There is severe (4+) mitral regurgitation and moderate mitral valve prolapse.  Normal coronary anatomy  1. Normal coronary anatomy 2. Normal LV function 3. Normal right heart pressures. 4. Normal PCWP with mildly elevated LVEDP 5. Normal cardiac output.  Plan: consider for MV repair.   TEE 09/21/16: Study Conclusions - Left ventricle: Systolic function was normal. The estimated   ejection fraction was in the range of 60% to 65%. Wall motion was   normal; there were no regional wall motion abnormalities. - Aortic valve: There was mild regurgitation. - Mitral valve: Severe thickening of the anterior leaflet and   posterior leaflet, consistent with myxomatous proliferation.   Severe, late systolic prolapse, involving the posterior leaflet   and mild prolapse of the anterior leaflet. There was severe,   mild-late systolic regurgitation directed eccentrically and   toward the septum. Severe regurgitation is suggested by an   effective regurgitant orifice >= 0.40 cm^2, a regurgitant volume   >= 60 ml, pulmonary vein systolic flow reversal, and Coanda   effect. Late systolic flow reversal was noted in the right upper   pulmonary vein. Valve area by continuity equation  (using LVOT   flow): 1.78 cm^2. - Left atrium: No evidence of thrombus in the atrial cavity or   appendage. No evidence of thrombus in the atrial cavity or   appendage. - Right atrium: No evidence of thrombus in the atrial cavity or   appendage. - Atrial septum: No defect or patent foramen ovale was identified   by color flow Doppler.  Preliminary Carotid U/S results 01/23/17: Carotid duplex: 1-39% ICA stenosis.  CTA chest/abd/pelvis 01/09/17: IMPRESSION: 1. No acute abnormality. No acute aortic syndrome. No aneurysm or significant stenosis. Moderate tortuosity of the bilateral common and external iliac arteries. 2. Mild Aortic Atherosclerosis (ICD10-I70.0). 3. Two solid pulmonary nodules in the right lung, largest 5 mm. No follow-up needed if patient is low-risk (and has no known or suspected primary neoplasm). Non-contrast chest CT can be considered in 12 months if patient is high-risk. This recommendation follows the consensus statement: Guidelines for Management of Incidental Pulmonary Nodules Detected on CT Images: From the Fleischner Society 2017; Radiology 2017; 284:228-243. 4. Mild sigmoid diverticulosis. 5. Mildly enlarged prostate.  CXR 01/23/17: IMPRESSION: Mild chronic bronchitic-reactive airway changes. No pneumonia nor pulmonary edema.  PFTs 01/23/17: FVC 5.41 (109%), FEV1 4.24 (113%).   Preoperative labs noted. CBC, PT/PTT WNL. Cr 0.88. UA WNL. A1c 5.3.   If no acute changes then I anticipate that he can proceed as planned.  George Hugh [facility] Short Stay Center/Anesthesiology Phone [phone] 01/23/2017 5:03 PM

## 2017-01-24 ENCOUNTER — Encounter (HOSPITAL_COMMUNITY)
Admission: RE | Disposition: A | Discharge status: Home / Self Care | Payer: Self-pay | Source: Ambulatory Visit | Attending: Thoracic Surgery (Cardiothoracic Vascular Surgery)

## 2017-01-24 ENCOUNTER — Inpatient Hospital Stay (HOSPITAL_COMMUNITY): Payer: 59

## 2017-01-24 ENCOUNTER — Inpatient Hospital Stay (HOSPITAL_COMMUNITY): Payer: 59 | Admitting: Anesthesiology

## 2017-01-24 ENCOUNTER — Inpatient Hospital Stay (HOSPITAL_COMMUNITY)
Admission: RE | Admit: 2017-01-24 | Discharge: 2017-01-28 | DRG: 219 | Disposition: A | Discharge status: Home / Self Care | Payer: 59 | Source: Ambulatory Visit | Attending: Thoracic Surgery (Cardiothoracic Vascular Surgery) | Admitting: Thoracic Surgery (Cardiothoracic Vascular Surgery)

## 2017-01-24 ENCOUNTER — Encounter (HOSPITAL_COMMUNITY): Payer: Self-pay | Admitting: *Deleted

## 2017-01-24 DIAGNOSIS — I493 Ventricular premature depolarization: Secondary | ICD-10-CM | POA: Diagnosis not present

## 2017-01-24 DIAGNOSIS — Z9889 Other specified postprocedural states: Secondary | ICD-10-CM

## 2017-01-24 DIAGNOSIS — I081 Rheumatic disorders of both mitral and tricuspid valves: Principal | ICD-10-CM | POA: Diagnosis present

## 2017-01-24 DIAGNOSIS — Z841 Family history of disorders of kidney and ureter: Secondary | ICD-10-CM | POA: Diagnosis not present

## 2017-01-24 DIAGNOSIS — I459 Conduction disorder, unspecified: Secondary | ICD-10-CM | POA: Diagnosis present

## 2017-01-24 DIAGNOSIS — Z79899 Other long term (current) drug therapy: Secondary | ICD-10-CM

## 2017-01-24 DIAGNOSIS — Z23 Encounter for immunization: Secondary | ICD-10-CM | POA: Diagnosis not present

## 2017-01-24 DIAGNOSIS — J90 Pleural effusion, not elsewhere classified: Secondary | ICD-10-CM | POA: Diagnosis not present

## 2017-01-24 DIAGNOSIS — Z7982 Long term (current) use of aspirin: Secondary | ICD-10-CM

## 2017-01-24 DIAGNOSIS — E8770 Fluid overload, unspecified: Secondary | ICD-10-CM | POA: Diagnosis present

## 2017-01-24 DIAGNOSIS — I340 Nonrheumatic mitral (valve) insufficiency: Secondary | ICD-10-CM | POA: Diagnosis present

## 2017-01-24 DIAGNOSIS — Z8249 Family history of ischemic heart disease and other diseases of the circulatory system: Secondary | ICD-10-CM

## 2017-01-24 DIAGNOSIS — I511 Rupture of chordae tendineae, not elsewhere classified: Secondary | ICD-10-CM | POA: Diagnosis present

## 2017-01-24 DIAGNOSIS — I272 Pulmonary hypertension, unspecified: Secondary | ICD-10-CM | POA: Diagnosis present

## 2017-01-24 DIAGNOSIS — Z91030 Bee allergy status: Secondary | ICD-10-CM | POA: Diagnosis not present

## 2017-01-24 DIAGNOSIS — D696 Thrombocytopenia, unspecified: Secondary | ICD-10-CM | POA: Diagnosis not present

## 2017-01-24 DIAGNOSIS — Z801 Family history of malignant neoplasm of trachea, bronchus and lung: Secondary | ICD-10-CM

## 2017-01-24 DIAGNOSIS — J9811 Atelectasis: Secondary | ICD-10-CM | POA: Diagnosis not present

## 2017-01-24 DIAGNOSIS — Z4682 Encounter for fitting and adjustment of non-vascular catheter: Secondary | ICD-10-CM

## 2017-01-24 DIAGNOSIS — I341 Nonrheumatic mitral (valve) prolapse: Secondary | ICD-10-CM | POA: Diagnosis present

## 2017-01-24 HISTORY — DX: Low back pain, unspecified: M54.50

## 2017-01-24 HISTORY — DX: Other chronic pain: G89.29

## 2017-01-24 HISTORY — DX: Pneumonia, unspecified organism: J18.9

## 2017-01-24 HISTORY — PX: TEE WITHOUT CARDIOVERSION: SHX5443

## 2017-01-24 HISTORY — DX: Other specified postprocedural states: Z98.890

## 2017-01-24 HISTORY — PX: MITRAL VALVE REPAIR: SHX2039

## 2017-01-24 HISTORY — DX: Low back pain: M54.5

## 2017-01-24 LAB — POCT I-STAT 3, ART BLOOD GAS (G3+)
ACID-BASE EXCESS: 3 mmol/L — AB (ref 0.0–2.0)
Acid-base deficit: 4 mmol/L — ABNORMAL HIGH (ref 0.0–2.0)
Acid-base deficit: 2 mmol/L (ref 0.0–2.0)
BICARBONATE: 28.2 mmol/L — AB (ref 20.0–28.0)
Bicarbonate: 24.1 mmol/L (ref 20.0–28.0)
Bicarbonate: 25 mmol/L (ref 20.0–28.0)
Bicarbonate: 23.9 mmol/L (ref 20.0–28.0)
O2 SAT: 96 %
O2 Saturation: 100 %
O2 Saturation: 99 %
O2 Saturation: 98 %
PCO2 ART: 49.7 mmHg — AB (ref 32.0–48.0)
PCO2 ART: 41.8 mmHg (ref 32.0–48.0)
PCO2 ART: 45.4 mmHg (ref 32.0–48.0)
PH ART: 7.388 (ref 7.350–7.450)
PH ART: 7.284 — AB (ref 7.350–7.450)
PO2 ART: 89 mmHg (ref 83.0–108.0)
Patient temperature: 35.2
Patient temperature: 36.4
Patient temperature: 36.6
TCO2: 30 mmol/L (ref 22–32)
TCO2: 26 mmol/L (ref 22–32)
TCO2: 26 mmol/L (ref 22–32)
TCO2: 25 mmol/L (ref 22–32)
pCO2 arterial: 46.8 mmHg (ref 32.0–48.0)
pH, Arterial: 7.383 (ref 7.350–7.450)
pH, Arterial: 7.328 — ABNORMAL LOW (ref 7.350–7.450)
pO2, Arterial: 315 mmHg — ABNORMAL HIGH (ref 83.0–108.0)
pO2, Arterial: 135 mmHg — ABNORMAL HIGH (ref 83.0–108.0)
pO2, Arterial: 117 mmHg — ABNORMAL HIGH (ref 83.0–108.0)

## 2017-01-24 LAB — POCT I-STAT, CHEM 8
BUN: 17 mg/dL (ref 6–20)
BUN: 16 mg/dL (ref 6–20)
BUN: 16 mg/dL (ref 6–20)
BUN: 16 mg/dL (ref 6–20)
BUN: 16 mg/dL (ref 6–20)
BUN: 16 mg/dL (ref 6–20)
BUN: 14 mg/dL (ref 6–20)
CALCIUM ION: 1.26 mmol/L (ref 1.15–1.40)
CALCIUM ION: 1.07 mmol/L — AB (ref 1.15–1.40)
CALCIUM ION: 1.1 mmol/L — AB (ref 1.15–1.40)
CALCIUM ION: 1.1 mmol/L — AB (ref 1.15–1.40)
CALCIUM ION: 1.06 mmol/L — AB (ref 1.15–1.40)
CHLORIDE: 105 mmol/L (ref 101–111)
CHLORIDE: 104 mmol/L (ref 101–111)
CHLORIDE: 105 mmol/L (ref 101–111)
CHLORIDE: 106 mmol/L (ref 101–111)
CREATININE: 0.8 mg/dL (ref 0.61–1.24)
CREATININE: 0.8 mg/dL (ref 0.61–1.24)
CREATININE: 0.7 mg/dL (ref 0.61–1.24)
CREATININE: 0.7 mg/dL (ref 0.61–1.24)
CREATININE: 0.7 mg/dL (ref 0.61–1.24)
Calcium, Ion: 1.24 mmol/L (ref 1.15–1.40)
Calcium, Ion: 1.15 mmol/L (ref 1.15–1.40)
Chloride: 106 mmol/L (ref 101–111)
Chloride: 105 mmol/L (ref 101–111)
Chloride: 104 mmol/L (ref 101–111)
Creatinine, Ser: 0.7 mg/dL (ref 0.61–1.24)
Creatinine, Ser: 0.8 mg/dL (ref 0.61–1.24)
GLUCOSE: 113 mg/dL — AB (ref 65–99)
GLUCOSE: 128 mg/dL — AB (ref 65–99)
GLUCOSE: 164 mg/dL — AB (ref 65–99)
GLUCOSE: 176 mg/dL — AB (ref 65–99)
Glucose, Bld: 109 mg/dL — ABNORMAL HIGH (ref 65–99)
Glucose, Bld: 164 mg/dL — ABNORMAL HIGH (ref 65–99)
Glucose, Bld: 133 mg/dL — ABNORMAL HIGH (ref 65–99)
HCT: 39 % (ref 39.0–52.0)
HCT: 37 % — ABNORMAL LOW (ref 39.0–52.0)
HCT: 32 % — ABNORMAL LOW (ref 39.0–52.0)
HCT: 29 % — ABNORMAL LOW (ref 39.0–52.0)
HCT: 31 % — ABNORMAL LOW (ref 39.0–52.0)
HCT: 28 % — ABNORMAL LOW (ref 39.0–52.0)
HEMATOCRIT: 37 % — AB (ref 39.0–52.0)
HEMOGLOBIN: 12.6 g/dL — AB (ref 13.0–17.0)
HEMOGLOBIN: 10.5 g/dL — AB (ref 13.0–17.0)
HEMOGLOBIN: 9.5 g/dL — AB (ref 13.0–17.0)
Hemoglobin: 13.3 g/dL (ref 13.0–17.0)
Hemoglobin: 10.9 g/dL — ABNORMAL LOW (ref 13.0–17.0)
Hemoglobin: 9.9 g/dL — ABNORMAL LOW (ref 13.0–17.0)
Hemoglobin: 12.6 g/dL — ABNORMAL LOW (ref 13.0–17.0)
POTASSIUM: 4.6 mmol/L (ref 3.5–5.1)
POTASSIUM: 4.4 mmol/L (ref 3.5–5.1)
Potassium: 3.9 mmol/L (ref 3.5–5.1)
Potassium: 4.1 mmol/L (ref 3.5–5.1)
Potassium: 3.9 mmol/L (ref 3.5–5.1)
Potassium: 5.2 mmol/L — ABNORMAL HIGH (ref 3.5–5.1)
Potassium: 4.6 mmol/L (ref 3.5–5.1)
SODIUM: 138 mmol/L (ref 135–145)
SODIUM: 140 mmol/L (ref 135–145)
Sodium: 141 mmol/L (ref 135–145)
Sodium: 141 mmol/L (ref 135–145)
Sodium: 140 mmol/L (ref 135–145)
Sodium: 137 mmol/L (ref 135–145)
Sodium: 139 mmol/L (ref 135–145)
TCO2: 28 mmol/L (ref 22–32)
TCO2: 26 mmol/L (ref 22–32)
TCO2: 27 mmol/L (ref 22–32)
TCO2: 23 mmol/L (ref 22–32)
TCO2: 27 mmol/L (ref 22–32)
TCO2: 21 mmol/L — AB (ref 22–32)
TCO2: 24 mmol/L (ref 22–32)

## 2017-01-24 LAB — CBC
HCT: 39.9 % (ref 39.0–52.0)
HEMATOCRIT: 40.2 % (ref 39.0–52.0)
Hemoglobin: 13.2 g/dL (ref 13.0–17.0)
Hemoglobin: 13.3 g/dL (ref 13.0–17.0)
MCH: 28.8 pg (ref 26.0–34.0)
MCH: 28.4 pg (ref 26.0–34.0)
MCHC: 33.1 g/dL (ref 30.0–36.0)
MCHC: 33.1 g/dL (ref 30.0–36.0)
MCV: 86.9 fL (ref 78.0–100.0)
MCV: 85.7 fL (ref 78.0–100.0)
PLATELETS: 137 10*3/uL — AB (ref 150–400)
Platelets: 129 10*3/uL — ABNORMAL LOW (ref 150–400)
RBC: 4.59 MIL/uL (ref 4.22–5.81)
RBC: 4.69 MIL/uL (ref 4.22–5.81)
RDW: 13.5 % (ref 11.5–15.5)
RDW: 13.2 % (ref 11.5–15.5)
WBC: 11 10*3/uL — ABNORMAL HIGH (ref 4.0–10.5)
WBC: 9.5 10*3/uL (ref 4.0–10.5)

## 2017-01-24 LAB — POCT I-STAT 4, (NA,K, GLUC, HGB,HCT)
Glucose, Bld: 96 mg/dL (ref 65–99)
HCT: 38 % — ABNORMAL LOW (ref 39.0–52.0)
Hemoglobin: 12.9 g/dL — ABNORMAL LOW (ref 13.0–17.0)
POTASSIUM: 3.8 mmol/L (ref 3.5–5.1)
SODIUM: 143 mmol/L (ref 135–145)

## 2017-01-24 LAB — PLATELET COUNT: Platelets: 148 10*3/uL — ABNORMAL LOW (ref 150–400)

## 2017-01-24 LAB — APTT: aPTT: 37 seconds — ABNORMAL HIGH (ref 24–36)

## 2017-01-24 LAB — GLUCOSE, CAPILLARY
GLUCOSE-CAPILLARY: 84 mg/dL (ref 65–99)
GLUCOSE-CAPILLARY: 95 mg/dL (ref 65–99)
GLUCOSE-CAPILLARY: 101 mg/dL — AB (ref 65–99)
Glucose-Capillary: 129 mg/dL — ABNORMAL HIGH (ref 65–99)

## 2017-01-24 LAB — ECHO TEE: LASIZE: 4.5 mm

## 2017-01-24 LAB — CREATININE, SERUM
Creatinine, Ser: 0.97 mg/dL (ref 0.61–1.24)
GFR calc Af Amer: >60 mL/min (ref >60)
GFR calc non Af Amer: >60 mL/min (ref >60)

## 2017-01-24 LAB — PROTIME-INR
INR: 1.23
PROTHROMBIN TIME: 15.4 s — AB (ref 11.4–15.2)

## 2017-01-24 LAB — HEMOGLOBIN AND HEMATOCRIT, BLOOD
HCT: 30.3 % — ABNORMAL LOW (ref 39.0–52.0)
HEMOGLOBIN: 10 g/dL — AB (ref 13.0–17.0)

## 2017-01-24 LAB — MAGNESIUM: MAGNESIUM: 2.7 mg/dL — AB (ref 1.7–2.4)

## 2017-01-24 SURGERY — REPAIR, MITRAL VALVE, MINIMALLY INVASIVE
Anesthesia: General | Site: Chest | Laterality: Right

## 2017-01-24 MED ORDER — MAGNESIUM SULFATE 4 GM/100ML IV SOLN
4.0000 g | Freq: Once | INTRAVENOUS | Status: AC
  Administered 2017-01-24: 4 g via INTRAVENOUS
  Filled 2017-01-24: qty 100

## 2017-01-24 MED ORDER — LACTATED RINGERS IV SOLN: INTRAVENOUS | Status: DC

## 2017-01-24 MED ORDER — SODIUM CHLORIDE 0.45 % IV SOLN
INTRAVENOUS | Status: DC | PRN
Start: 2017-01-24 — End: 2017-01-25
  Administered 2017-01-24: 14:00:00 via INTRAVENOUS

## 2017-01-24 MED ORDER — HEPARIN SODIUM (PORCINE) 1000 UNIT/ML IJ SOLN
INTRAMUSCULAR | Status: DC | PRN
  Administered 2017-01-24: 23000 [IU] via INTRAVENOUS

## 2017-01-24 MED ORDER — LACTATED RINGERS IV SOLN
INTRAVENOUS | Status: DC
  Administered 2017-01-24: 14:00:00 via INTRAVENOUS

## 2017-01-24 MED ORDER — 0.9 % SODIUM CHLORIDE (POUR BTL) OPTIME
TOPICAL | Status: DC | PRN
  Administered 2017-01-24: 4000 mL

## 2017-01-24 MED ORDER — FENTANYL CITRATE (PF) 250 MCG/5ML IJ SOLN
INTRAMUSCULAR | Status: DC | PRN
Start: 2017-01-24 — End: 2017-01-24
  Administered 2017-01-24: 100 ug via INTRAVENOUS
  Administered 2017-01-24: 125 ug via INTRAVENOUS
  Administered 2017-01-24: 50 ug via INTRAVENOUS
  Administered 2017-01-24: 25 ug via INTRAVENOUS
  Administered 2017-01-24: 50 ug via INTRAVENOUS
  Administered 2017-01-24 (×7): 100 ug via INTRAVENOUS

## 2017-01-24 MED ORDER — ASPIRIN EC 325 MG PO TBEC
325.0000 mg | DELAYED_RELEASE_TABLET | Freq: Every day | ORAL | Status: DC
  Administered 2017-01-25: 325 mg via ORAL
  Filled 2017-01-24: qty 1

## 2017-01-24 MED ORDER — ACETAMINOPHEN 160 MG/5ML PO SOLN: 650.0000 mg | Freq: Once | ORAL | Status: AC

## 2017-01-24 MED ORDER — METOCLOPRAMIDE HCL 5 MG/ML IJ SOLN
10.0000 mg | Freq: Four times a day (QID) | INTRAMUSCULAR | Status: AC
  Administered 2017-01-24 – 2017-01-25 (×4): 10 mg via INTRAVENOUS
  Filled 2017-01-24 (×4): qty 2

## 2017-01-24 MED ORDER — DEXMEDETOMIDINE HCL IN NACL 400 MCG/100ML IV SOLN: 0.4000 ug/kg/h | INTRAVENOUS | Status: DC

## 2017-01-24 MED ORDER — ALBUMIN HUMAN 5 % IV SOLN
250.0000 mL | INTRAVENOUS | Status: AC | PRN
  Administered 2017-01-24 (×2): 250 mL via INTRAVENOUS

## 2017-01-24 MED ORDER — NITROGLYCERIN IN D5W 200-5 MCG/ML-% IV SOLN: 0.0000 ug/min | INTRAVENOUS | Status: DC

## 2017-01-24 MED ORDER — SODIUM CHLORIDE 0.9 % IV SOLN
0.0000 ug/kg/h | INTRAVENOUS | Status: DC
  Administered 2017-01-24: 0.5 ug/kg/h via INTRAVENOUS
  Filled 2017-01-24: qty 2

## 2017-01-24 MED ORDER — MIDAZOLAM HCL 5 MG/5ML IJ SOLN
INTRAMUSCULAR | Status: DC | PRN
  Administered 2017-01-24 (×2): 2 mg via INTRAVENOUS
  Administered 2017-01-24 (×4): 1 mg via INTRAVENOUS

## 2017-01-24 MED ORDER — LACTATED RINGERS IV SOLN
INTRAVENOUS | Status: DC | PRN
  Administered 2017-01-24: 07:00:00 via INTRAVENOUS

## 2017-01-24 MED ORDER — MIDAZOLAM HCL 2 MG/2ML IJ SOLN
2.0000 mg | INTRAMUSCULAR | Status: DC | PRN
Start: 2017-01-24 — End: 2017-01-24

## 2017-01-24 MED ORDER — DEXTROSE 5 % IV SOLN
1.5000 g | Freq: Two times a day (BID) | INTRAVENOUS | Status: AC
  Administered 2017-01-24 – 2017-01-26 (×4): 1.5 g via INTRAVENOUS
  Filled 2017-01-24 (×4): qty 1.5

## 2017-01-24 MED ORDER — MIDAZOLAM HCL 10 MG/2ML IJ SOLN
INTRAMUSCULAR | Status: AC
  Filled 2017-01-24: qty 2

## 2017-01-24 MED ORDER — PROPOFOL 10 MG/ML IV BOLUS
INTRAVENOUS | Status: DC | PRN
  Administered 2017-01-24: 150 mg via INTRAVENOUS

## 2017-01-24 MED ORDER — SODIUM CHLORIDE 0.9 % IV SOLN
INTRAVENOUS | Status: DC
  Administered 2017-01-24: 14:00:00 via INTRAVENOUS

## 2017-01-24 MED ORDER — INSULIN REGULAR BOLUS VIA INFUSION
0.0000 [IU] | Freq: Three times a day (TID) | INTRAVENOUS | Status: DC
Start: 2017-01-24 — End: 2017-01-25
  Filled 2017-01-24: qty 10

## 2017-01-24 MED ORDER — INSULIN ASPART 100 UNIT/ML ~~LOC~~ SOLN
0.0000 [IU] | SUBCUTANEOUS | Status: DC
  Administered 2017-01-25 (×3): 2 [IU] via SUBCUTANEOUS

## 2017-01-24 MED ORDER — POTASSIUM CHLORIDE 10 MEQ/50ML IV SOLN
10.0000 meq | INTRAVENOUS | Status: AC
  Administered 2017-01-24 (×3): 10 meq via INTRAVENOUS

## 2017-01-24 MED ORDER — BUPIVACAINE HCL (PF) 0.5 % IJ SOLN
INTRAMUSCULAR | Status: AC
  Filled 2017-01-24: qty 30

## 2017-01-24 MED ORDER — INSULIN REGULAR HUMAN 100 UNIT/ML IJ SOLN
INTRAMUSCULAR | Status: DC
  Filled 2017-01-24: qty 1

## 2017-01-24 MED ORDER — ROCURONIUM BROMIDE 10 MG/ML (PF) SYRINGE
PREFILLED_SYRINGE | INTRAVENOUS | Status: DC | PRN
  Administered 2017-01-24: 20 mg via INTRAVENOUS
  Administered 2017-01-24: 30 mg via INTRAVENOUS
  Administered 2017-01-24 (×2): 50 mg via INTRAVENOUS
  Administered 2017-01-24: 30 mg via INTRAVENOUS
  Administered 2017-01-24: 50 mg via INTRAVENOUS

## 2017-01-24 MED ORDER — CHLORHEXIDINE GLUCONATE 4 % EX LIQD: 30.0000 mL | CUTANEOUS | Status: DC

## 2017-01-24 MED ORDER — MORPHINE SULFATE (PF) 4 MG/ML IV SOLN
1.0000 mg | INTRAVENOUS | Status: DC | PRN
  Administered 2017-01-24 – 2017-01-26 (×7): 2 mg via INTRAVENOUS
  Filled 2017-01-24 (×6): qty 1

## 2017-01-24 MED ORDER — ACETAMINOPHEN 160 MG/5ML PO SOLN: 1000.0000 mg | Freq: Four times a day (QID) | ORAL | Status: DC

## 2017-01-24 MED ORDER — BISACODYL 10 MG RE SUPP: 10.0000 mg | Freq: Every day | RECTAL | Status: DC

## 2017-01-24 MED ORDER — ONDANSETRON HCL 4 MG/2ML IJ SOLN
4.0000 mg | Freq: Four times a day (QID) | INTRAMUSCULAR | Status: DC | PRN
  Administered 2017-01-24 (×2): 4 mg via INTRAVENOUS
  Filled 2017-01-24 (×2): qty 2

## 2017-01-24 MED ORDER — PHENYLEPHRINE HCL 10 MG/ML IJ SOLN
0.0000 ug/min | INTRAMUSCULAR | Status: DC
  Filled 2017-01-24: qty 2

## 2017-01-24 MED ORDER — FENTANYL CITRATE (PF) 250 MCG/5ML IJ SOLN
INTRAMUSCULAR | Status: AC
  Filled 2017-01-24: qty 25

## 2017-01-24 MED ORDER — ACETAMINOPHEN 650 MG RE SUPP
650.0000 mg | Freq: Once | RECTAL | Status: AC
  Administered 2017-01-24: 650 mg via RECTAL

## 2017-01-24 MED ORDER — VANCOMYCIN HCL 1000 MG IV SOLR
INTRAVENOUS | Status: AC
  Administered 2017-01-24: 1000 mL
  Filled 2017-01-24: qty 1000

## 2017-01-24 MED ORDER — PANTOPRAZOLE SODIUM 40 MG PO TBEC
40.0000 mg | DELAYED_RELEASE_TABLET | Freq: Every day | ORAL | Status: DC
  Administered 2017-01-26 – 2017-01-28 (×3): 40 mg via ORAL
  Filled 2017-01-24 (×3): qty 1

## 2017-01-24 MED ORDER — CHLORHEXIDINE GLUCONATE 0.12 % MT SOLN
15.0000 mL | OROMUCOSAL | Status: AC
  Administered 2017-01-24: 15 mL via OROMUCOSAL

## 2017-01-24 MED ORDER — METOPROLOL TARTRATE 5 MG/5ML IV SOLN
2.5000 mg | INTRAVENOUS | Status: DC | PRN
  Administered 2017-01-25: 2.5 mg via INTRAVENOUS

## 2017-01-24 MED ORDER — BUPIVACAINE HCL (PF) 0.5 % IJ SOLN
INTRAMUSCULAR | Status: DC | PRN
  Administered 2017-01-24: 30 mL

## 2017-01-24 MED ORDER — LACTATED RINGERS IV SOLN: 500.0000 mL | Freq: Once | INTRAVENOUS | Status: DC | PRN

## 2017-01-24 MED ORDER — METOPROLOL TARTRATE 25 MG/10 ML ORAL SUSPENSION: 12.5000 mg | Freq: Two times a day (BID) | ORAL | Status: DC

## 2017-01-24 MED ORDER — ALBUMIN HUMAN 5 % IV SOLN
INTRAVENOUS | Status: DC | PRN
  Administered 2017-01-24: 12:00:00 via INTRAVENOUS

## 2017-01-24 MED ORDER — SODIUM CHLORIDE 0.9% FLUSH: 3.0000 mL | INTRAVENOUS | Status: DC | PRN

## 2017-01-24 MED ORDER — TRAMADOL HCL 50 MG PO TABS
50.0000 mg | ORAL_TABLET | ORAL | Status: DC | PRN
  Administered 2017-01-27: 100 mg via ORAL
  Filled 2017-01-24: qty 2

## 2017-01-24 MED ORDER — PROTAMINE SULFATE 10 MG/ML IV SOLN
INTRAVENOUS | Status: DC | PRN
  Administered 2017-01-24: 10 mg via INTRAVENOUS
  Administered 2017-01-24: 190 mg via INTRAVENOUS

## 2017-01-24 MED ORDER — ASPIRIN 81 MG PO CHEW: 324.0000 mg | CHEWABLE_TABLET | Freq: Every day | ORAL | Status: DC

## 2017-01-24 MED ORDER — FAMOTIDINE IN NACL 20-0.9 MG/50ML-% IV SOLN
20.0000 mg | Freq: Two times a day (BID) | INTRAVENOUS | Status: DC
  Administered 2017-01-24: 20 mg via INTRAVENOUS

## 2017-01-24 MED ORDER — DOCUSATE SODIUM 100 MG PO CAPS
200.0000 mg | ORAL_CAPSULE | Freq: Every day | ORAL | Status: DC
  Administered 2017-01-25 – 2017-01-28 (×4): 200 mg via ORAL
  Filled 2017-01-24 (×4): qty 2

## 2017-01-24 MED ORDER — MORPHINE SULFATE (PF) 4 MG/ML IV SOLN
1.0000 mg | INTRAVENOUS | Status: DC | PRN
  Administered 2017-01-24: 4 mg via INTRAVENOUS
  Filled 2017-01-24 (×2): qty 1

## 2017-01-24 MED ORDER — SODIUM CHLORIDE 0.9 % IV SOLN: 250.0000 mL | INTRAVENOUS | Status: DC

## 2017-01-24 MED ORDER — METOPROLOL TARTRATE 12.5 MG HALF TABLET
12.5000 mg | ORAL_TABLET | Freq: Two times a day (BID) | ORAL | Status: DC
  Filled 2017-01-24: qty 1

## 2017-01-24 MED ORDER — SODIUM CHLORIDE 0.9% FLUSH
3.0000 mL | Freq: Two times a day (BID) | INTRAVENOUS | Status: DC
  Administered 2017-01-25 (×2): 3 mL via INTRAVENOUS

## 2017-01-24 MED ORDER — BISACODYL 5 MG PO TBEC
10.0000 mg | DELAYED_RELEASE_TABLET | Freq: Every day | ORAL | Status: DC
  Administered 2017-01-25 – 2017-01-28 (×4): 10 mg via ORAL
  Filled 2017-01-24 (×4): qty 2

## 2017-01-24 MED ORDER — PROPOFOL 10 MG/ML IV BOLUS
INTRAVENOUS | Status: AC
  Filled 2017-01-24: qty 20

## 2017-01-24 MED ORDER — OXYCODONE HCL 5 MG PO TABS
5.0000 mg | ORAL_TABLET | ORAL | Status: DC | PRN
  Administered 2017-01-25 – 2017-01-28 (×12): 10 mg via ORAL
  Filled 2017-01-24 (×12): qty 2

## 2017-01-24 MED ORDER — BUPIVACAINE 0.5 % ON-Q PUMP SINGLE CATH 400 ML
400.0000 mL | INJECTION | Status: AC
Start: 2017-01-24 — End: 2017-01-24
  Administered 2017-01-24: 400 mL
  Filled 2017-01-24: qty 400

## 2017-01-24 MED ORDER — VANCOMYCIN HCL IN DEXTROSE 1-5 GM/200ML-% IV SOLN
1000.0000 mg | Freq: Once | INTRAVENOUS | Status: AC
  Administered 2017-01-24: 1000 mg via INTRAVENOUS
  Filled 2017-01-24: qty 200

## 2017-01-24 MED ORDER — ACETAMINOPHEN 500 MG PO TABS
1000.0000 mg | ORAL_TABLET | Freq: Four times a day (QID) | ORAL | Status: DC
  Administered 2017-01-25 – 2017-01-28 (×11): 1000 mg via ORAL
  Filled 2017-01-24 (×12): qty 2

## 2017-01-24 MED ORDER — ORAL CARE MOUTH RINSE
15.0000 mL | Freq: Two times a day (BID) | OROMUCOSAL | Status: DC
  Administered 2017-01-24 – 2017-01-26 (×2): 15 mL via OROMUCOSAL

## 2017-01-24 MED FILL — Mannitol IV Soln 20%: INTRAVENOUS | Qty: 500 | Status: AC

## 2017-01-24 MED FILL — Sodium Chloride IV Soln 0.9%: INTRAVENOUS | Qty: 3000 | Status: AC

## 2017-01-24 MED FILL — Lidocaine HCl IV Inj 20 MG/ML: INTRAVENOUS | Qty: 25 | Status: AC

## 2017-01-24 MED FILL — Electrolyte-R (PH 7.4) Solution: INTRAVENOUS | Qty: 4000 | Status: AC

## 2017-01-24 MED FILL — Heparin Sodium (Porcine) Inj 1000 Unit/ML: INTRAMUSCULAR | Qty: 10 | Status: AC

## 2017-01-24 MED FILL — Sodium Bicarbonate IV Soln 8.4%: INTRAVENOUS | Qty: 50 | Status: AC

## 2017-01-24 SURGICAL SUPPLY — 101 items
ADAPTER CARDIO PERF ANTE/RETRO (ADAPTER) ×3 IMPLANT
BAG DECANTER FOR FLEXI CONT (MISCELLANEOUS) ×6 IMPLANT
BLADE SURG 11 STRL SS (BLADE) ×3 IMPLANT
CANISTER SUCT 3000ML PPV (MISCELLANEOUS) ×6 IMPLANT
CANNULA FEM VENOUS REMOTE 22FR (CANNULA) ×3 IMPLANT
CANNULA FEMORAL ART 14 SM (MISCELLANEOUS) ×3 IMPLANT
CANNULA GUNDRY RCSP 15FR (MISCELLANEOUS) ×3 IMPLANT
CANNULA OPTISITE PERFUSION 16F (CANNULA) IMPLANT
CANNULA OPTISITE PERFUSION 18F (CANNULA) ×3 IMPLANT
CANNULA SUMP PERICARDIAL (CANNULA) ×6 IMPLANT
CATH KIT ON Q 5IN SLV (PAIN MANAGEMENT) ×3 IMPLANT
CONN ST 1/4X3/8  BEN (MISCELLANEOUS) ×2
CONN ST 1/4X3/8 BEN (MISCELLANEOUS) ×4 IMPLANT
CONNECTOR 1/2X3/8X1/2 3 WAY (MISCELLANEOUS) ×1
CONNECTOR 1/2X3/8X1/2 3WAY (MISCELLANEOUS) ×2 IMPLANT
CONT SPEC 4OZ CLIKSEAL STRL BL (MISCELLANEOUS) ×3 IMPLANT
COVER BACK TABLE 24X17X13 BIG (DRAPES) ×3 IMPLANT
COVER MAYO STAND STRL (DRAPES) ×3 IMPLANT
CRADLE DONUT ADULT HEAD (MISCELLANEOUS) ×3 IMPLANT
DERMABOND ADHESIVE PROPEN (GAUZE/BANDAGES/DRESSINGS) ×1
DERMABOND ADVANCED (GAUZE/BANDAGES/DRESSINGS) ×2
DERMABOND ADVANCED .7 DNX12 (GAUZE/BANDAGES/DRESSINGS) ×4 IMPLANT
DERMABOND ADVANCED .7 DNX6 (GAUZE/BANDAGES/DRESSINGS) ×2 IMPLANT
DEVICE PMI PUNCTURE CLOSURE (MISCELLANEOUS) ×3 IMPLANT
DEVICE SUT CK QUICK LOAD MINI (Prosthesis & Implant Heart) ×6 IMPLANT
DEVICE TROCAR PUNCTURE CLOSURE (ENDOMECHANICALS) ×3 IMPLANT
DRAIN CHANNEL 28F RND 3/8 FF (WOUND CARE) ×6 IMPLANT
DRAPE BILATERAL SPLIT (DRAPES) ×3 IMPLANT
DRAPE C-ARM 42X72 X-RAY (DRAPES) ×3 IMPLANT
DRAPE CV SPLIT W-CLR ANES SCRN (DRAPES) ×3 IMPLANT
DRAPE INCISE IOBAN 66X45 STRL (DRAPES) ×9 IMPLANT
DRAPE SLUSH/WARMER DISC (DRAPES) ×3 IMPLANT
DRSG COVADERM 4X8 (GAUZE/BANDAGES/DRESSINGS) ×3 IMPLANT
ELECT BLADE 6.5 EXT (BLADE) ×3 IMPLANT
ELECT REM PT RETURN 9FT ADLT (ELECTROSURGICAL) ×6
ELECTRODE REM PT RTRN 9FT ADLT (ELECTROSURGICAL) ×4 IMPLANT
FELT TEFLON 1X6 (MISCELLANEOUS) ×6 IMPLANT
FEMORAL VENOUS CANN RAP (CANNULA) IMPLANT
GAUZE SPONGE 4X4 12PLY STRL (GAUZE/BANDAGES/DRESSINGS) ×3 IMPLANT
GLOVE BIO SURGEON STRL SZ 6 (GLOVE) ×3 IMPLANT
GLOVE BIO SURGEON STRL SZ 6.5 (GLOVE) ×18 IMPLANT
GLOVE ORTHO TXT STRL SZ7.5 (GLOVE) ×9 IMPLANT
GLOVE SURG SS PI 6.0 STRL IVOR (GLOVE) ×3 IMPLANT
GOWN STRL REUS W/ TWL LRG LVL3 (GOWN DISPOSABLE) ×18 IMPLANT
GOWN STRL REUS W/TWL LRG LVL3 (GOWN DISPOSABLE) ×9
IV NS IRRIG 3000ML ARTHROMATIC (IV SOLUTION) ×3 IMPLANT
KIT BASIN OR (CUSTOM PROCEDURE TRAY) ×3 IMPLANT
KIT DILATOR VASC 18G NDL (KITS) ×3 IMPLANT
KIT DRAINAGE VACCUM ASSIST (KITS) ×3 IMPLANT
KIT ROOM TURNOVER OR (KITS) ×3 IMPLANT
KIT SUCTION CATH 14FR (SUCTIONS) ×3 IMPLANT
KIT SUT CK MINI COMBO 4X17 (Prosthesis & Implant Heart) ×3 IMPLANT
LEAD PACING MYOCARDI (MISCELLANEOUS) ×3 IMPLANT
LINE VENT (MISCELLANEOUS) ×3 IMPLANT
NEEDLE AORTIC ROOT 14G 7F (CATHETERS) ×3 IMPLANT
NS IRRIG 1000ML POUR BTL (IV SOLUTION) ×12 IMPLANT
PACK OPEN HEART (CUSTOM PROCEDURE TRAY) ×3 IMPLANT
PAD ARMBOARD 7.5X6 YLW CONV (MISCELLANEOUS) ×6 IMPLANT
PAD ELECT DEFIB RADIOL ZOLL (MISCELLANEOUS) ×3 IMPLANT
RING MITRAL MEMO 3D 34MM SMD34 (Prosthesis & Implant Heart) ×3 IMPLANT
SET CANNULATION TOURNIQUET (MISCELLANEOUS) ×3 IMPLANT
SET CARDIOPLEGIA MPS 5001102 (MISCELLANEOUS) ×3 IMPLANT
SET IRRIG TUBING LAPAROSCOPIC (IRRIGATION / IRRIGATOR) ×3 IMPLANT
SOLUTION ANTI FOG 6CC (MISCELLANEOUS) ×3 IMPLANT
SPONGE GAUZE 4X4 12PLY STER LF (GAUZE/BANDAGES/DRESSINGS) ×3 IMPLANT
STOPCOCK 4 WAY LG BORE MALE ST (IV SETS) ×3 IMPLANT
SUT BONE WAX W31G (SUTURE) ×3 IMPLANT
SUT E-PACK MINIMALLY INVASIVE (SUTURE) ×3 IMPLANT
SUT ETHIBOND (SUTURE) ×6 IMPLANT
SUT ETHIBOND 2 0 SH (SUTURE) ×3 IMPLANT
SUT ETHIBOND 2-0 RB-1 WHT (SUTURE) ×6 IMPLANT
SUT ETHIBOND X763 2 0 SH 1 (SUTURE) ×3 IMPLANT
SUT GORETEX CV 4 TH 22 36 (SUTURE) ×3 IMPLANT
SUT GORETEX CV-5THC-13 36IN (SUTURE) ×24 IMPLANT
SUT GORETEX CV4 TH-18 (SUTURE) ×6 IMPLANT
SUT PROLENE 3 0 SH1 36 (SUTURE) ×12 IMPLANT
SUT PTFE CHORD X 24MM (SUTURE) ×3 IMPLANT
SUT SILK  1 MH (SUTURE) ×1
SUT SILK 1 MH (SUTURE) ×2 IMPLANT
SUT SILK 2 0 SH CR/8 (SUTURE) IMPLANT
SUT SILK 3 0 SH CR/8 (SUTURE) IMPLANT
SUT VIC AB 2-0 CTX 36 (SUTURE) IMPLANT
SUT VIC AB 3-0 SH 8-18 (SUTURE) ×3 IMPLANT
SUT VICRYL 2 TP 1 (SUTURE) IMPLANT
SYR 10ML LL (SYRINGE) ×3 IMPLANT
SYSTEM SAHARA CHEST DRAIN ATS (WOUND CARE) ×3 IMPLANT
TAPE CLOTH SURG 4X10 WHT LF (GAUZE/BANDAGES/DRESSINGS) ×3 IMPLANT
TAPE PAPER 2X10 WHT MICROPORE (GAUZE/BANDAGES/DRESSINGS) ×3 IMPLANT
TOWEL GREEN STERILE (TOWEL DISPOSABLE) ×6 IMPLANT
TOWEL GREEN STERILE FF (TOWEL DISPOSABLE) ×3 IMPLANT
TOWEL OR 17X24 6PK STRL BLUE (TOWEL DISPOSABLE) ×3 IMPLANT
TOWEL OR 17X26 10 PK STRL BLUE (TOWEL DISPOSABLE) ×3 IMPLANT
TRAY FOLEY SILVER 16FR TEMP (SET/KITS/TRAYS/PACK) ×3 IMPLANT
TROCAR XCEL BLADELESS 5X75MML (TROCAR) ×3 IMPLANT
TROCAR XCEL NON-BLD 11X100MML (ENDOMECHANICALS) ×6 IMPLANT
TUBE SUCT INTRACARD DLP 20F (MISCELLANEOUS) ×3 IMPLANT
TUNNELER SHEATH ON-Q 11GX12 (MISCELLANEOUS) ×3 IMPLANT
TUNNELER SHEATH ON-Q 11GX8 DSP (PAIN MANAGEMENT) IMPLANT
UNDERPAD 30X30 (UNDERPADS AND DIAPERS) ×3 IMPLANT
WATER STERILE IRR 1000ML POUR (IV SOLUTION) ×6 IMPLANT
WIRE .035 3MM-J 145CM (WIRE) ×3 IMPLANT

## 2017-01-24 NOTE — Brief Op Note (Signed)
01/24/2017  1:03 PM  PATIENT:  Alan Mcguire  61 y.o. male  PRE-OPERATIVE DIAGNOSIS:  Mitral Regurgitation  POST-OPERATIVE DIAGNOSIS:  Mitral Regurgitation  PROCEDURE:  Procedure(s): MINIMALLY INVASIVE MITRAL VALVE REPAIR (MVR) (Right) TRANSESOPHAGEAL ECHOCARDIOGRAM (TEE) (N/A)  SURGEON:  Surgeon(s) and Role:    * Rexene Alberts, MD - Primary  PHYSICIAN ASSISTANT:  Nicholes Rough, PA-C  ASSISTANTS: Ara Kussmaul, CRNFA   ANESTHESIA:   general  EBL:  1225 mL   BLOOD ADMINISTERED:none  DRAINS: 2 Chest Tube(s) in the right pleural space   LOCAL MEDICATIONS USED:  BUPIVICAINE   SPECIMEN:  Source of Specimen:  portion of posterior leaflet  DISPOSITION OF SPECIMEN:  PATHOLOGY  COUNTS:  YES  TOURNIQUET:  * No tourniquets in log *  DICTATION: .Note written in EPIC  PLAN OF CARE: Admit to inpatient   PATIENT DISPOSITION:  ICU - intubated and hemodynamically stable.   Delay start of Pharmacological VTE agent (>24hrs) due to surgical blood loss or risk of bleeding: yes  Rexene Alberts, MD 01/24/2017 1:04 PM

## 2017-01-24 NOTE — Anesthesia Procedure Notes (Addendum)
Arterial Line Insertion Start/End11/13/2018 6:51 AM, 01/24/2017 6:51 AM Performed by: Lavell Luster, CRNA, CRNA  Patient location: Pre-op. Preanesthetic checklist: patient identified, IV checked, site marked, risks and benefits discussed, surgical consent, monitors and equipment checked, pre-op evaluation, timeout performed and anesthesia consent Lidocaine 1% used for infiltration radial was placed Catheter size: 20 Fr Hand hygiene performed  and maximum sterile barriers used   Attempts: 1 Procedure performed without using ultrasound guided technique. Following insertion, dressing applied. Post procedure assessment: normal and unchanged

## 2017-01-24 NOTE — Anesthesia Procedure Notes (Signed)
Procedure Name: Intubation Date/Time: 01/24/2017 8:01 AM Performed by: Clearnce Sorrel, CRNA Pre-anesthesia Checklist: Patient identified, Emergency Drugs available, Suction available, Patient being monitored and Timeout performed Patient Re-evaluated:Patient Re-evaluated prior to induction Oxygen Delivery Method: Circle system utilized Preoxygenation: Pre-oxygenation with 100% oxygen Induction Type: IV induction Ventilation: Mask ventilation without difficulty Laryngoscope Size: Mac, 3 and Glidescope Grade View: Grade II Tube type: Oral Endobronchial tube: Left and Double lumen EBT and 37 Fr Number of attempts: 2 Airway Equipment and Method: Stylet Placement Confirmation: ETT inserted through vocal cords under direct vision,  positive ETCO2 and breath sounds checked- equal and bilateral Tube secured with: Tape Dental Injury: Teeth and Oropharynx as per pre-operative assessment

## 2017-01-24 NOTE — Procedures (Signed)
Extubation Procedure Note  Patient Details:   Name: Alan Mcguire DOB: 1955-12-23 MRN: [id number]   Airway Documentation:     Evaluation  O2 sats: stable throughout Complications: No apparent complications Patient did tolerate procedure well. Bilateral Breath Sounds: Rhonchi, Diminished   Yes   Pt extubated to 4L [place] per Open Heart Rapid Wean Protocol. Pt NIF -22, VC 2.1, ABG within normal limits. Pt stable throughout with no complications. VS within normal limits. Pt able to speak and has a strong non-productive cough post extubation. Pt encouraged to use Incentive Spirometer and Yankauer post extubation. RN at bedside.   Jesse Sans 01/24/2017, 6:12 PM

## 2017-01-24 NOTE — Anesthesia Procedure Notes (Signed)
Central Venous Catheter Insertion Performed by: anesthesiologist Start/End11/13/2018 6:50 AM, 01/24/2017 7:00 AM Patient location: Pre-op. Preanesthetic checklist: patient identified, IV checked, site marked, risks and benefits discussed, surgical consent, monitors and equipment checked, pre-op evaluation, timeout performed and anesthesia consent Hand hygiene performed  and maximum sterile barriers used  PA cath was placed.Swan type:thermodilution Procedure performed without using ultrasound guided technique. Attempts: 1 Following insertion, line sutured, dressing applied and Biopatch. Post procedure assessment: blood return through all ports and free fluid flow  Patient tolerated the procedure well with no immediate complications.

## 2017-01-24 NOTE — Plan of Care (Signed)
  Progressing Respiratory: Respiratory status will improve 01/24/2017 1811 - Progressing by Netta Corrigan, RN Note Pt was extubated within the 6 hour time frame and it tolerating 4L nasal cannula well with oxygen saturations in the high 90s-100 Skin Integrity: Risk for impaired skin integrity will decrease 01/24/2017 1811 - Progressing by Netta Corrigan, RN Note Pt has sacral foam on and intact.  Urinary Elimination: Ability to achieve and maintain adequate renal perfusion and functioning will improve 01/24/2017 1811 - Progressing by Netta Corrigan, RN Note Pt has made adequate urine output in the immediate postoperative period

## 2017-01-24 NOTE — Progress Notes (Signed)
Patient ID: Alan Mcguire, male   DOB: 1955-09-09, 61 y.o.   MRN: [id number]  SICU Evening Rounds:   Hemodynamically stable  CI = 3.2 Extubated and awake Some nausea. Will use Zofran and Reglan  Urine output good  CT output low  CBC    Component Value Date/Time   WBC 11.0 (H) 01/24/2017 1330   RBC 4.59 01/24/2017 1330   HGB 12.9 (L) 01/24/2017 1349   HGB 14.8 10/21/2016 1147   HCT 38.0 (L) 01/24/2017 1349   HCT 43.6 10/21/2016 1147   PLT 137 (L) 01/24/2017 1330   PLT 203 10/21/2016 1147   MCV 86.9 01/24/2017 1330   MCV 85 10/21/2016 1147   MCH 28.8 01/24/2017 1330   MCHC 33.1 01/24/2017 1330   RDW 13.5 01/24/2017 1330   RDW 12.8 10/21/2016 1147   LYMPHSABS 0.9 10/21/2016 1147   MONOABS 0.4 10/27/2015 0831   EOSABS 0.1 10/21/2016 1147   BASOSABS 0.1 10/21/2016 1147     BMET    Component Value Date/Time   NA 143 01/24/2017 1349   NA 145 (H) 10/21/2016 1152   K 3.8 01/24/2017 1349   CL 104 01/24/2017 1224   CO2 22 01/23/2017 1519   GLUCOSE 96 01/24/2017 1349   GLUCOSE 92 01/25/2006 1004   BUN 16 01/24/2017 1224   BUN 18 10/21/2016 1152   CREATININE 0.70 01/24/2017 1224   CALCIUM 9.3 01/23/2017 1519   GFRNONAA >60 01/23/2017 1519   GFRAA >60 01/23/2017 1519     A/P:  Stable postop course. Continue current plans

## 2017-01-24 NOTE — Transfer of Care (Signed)
Immediate Anesthesia Transfer of Care Note  Patient: Alan Mcguire  Procedure(s) Performed: MINIMALLY INVASIVE MITRAL VALVE REPAIR (MVR) (Right Chest) TRANSESOPHAGEAL ECHOCARDIOGRAM (TEE) (N/A )  Patient Location: SICU  Anesthesia Type:General  Level of Consciousness: Patient remains intubated per anesthesia plan  Airway & Oxygen Therapy: Patient remains intubated per anesthesia plan  Post-op Assessment: Report given to RN and Post -op Vital signs reviewed and stable  Post vital signs: Reviewed and stable  Last Vitals:  Vitals:   01/24/17 0546 01/24/17 1338  BP: (!) 162/86 101/60  Pulse: 74 72  Resp: 18 12  Temp: 36.7 C   SpO2: 99% 100%    Last Pain: There were no vitals filed for this visit.       Complications: No apparent anesthesia complications

## 2017-01-24 NOTE — Progress Notes (Signed)
Initiated Open Heart Rapid Wean per Protocol. RN at bedside. Pt following commands, able to lift head off pillow and stick out tongue. RT will continue to monitor.

## 2017-01-24 NOTE — Anesthesia Preprocedure Evaluation (Signed)
Anesthesia Evaluation  Patient identified by MRN, date of birth, ID band Patient awake    Reviewed: Allergy & Precautions, NPO status , Patient's Chart, lab work & pertinent test results  Airway Mallampati: II  TM Distance: >3 FB Neck ROM: Full    Dental  (+) Teeth Intact, Dental Advisory Given   Pulmonary    breath sounds clear to auscultation       Cardiovascular  Rhythm:Regular Rate:Normal + Systolic murmurs    Neuro/Psych    GI/Hepatic   Endo/Other    Renal/GU      Musculoskeletal   Abdominal   Peds  Hematology   Anesthesia Other Findings   Reproductive/Obstetrics                             Anesthesia Physical Anesthesia Plan  ASA: III  Anesthesia Plan: General   Post-op Pain Management:    Induction: Intravenous  PONV Risk Score and Plan: Ondansetron and Dexamethasone  Airway Management Planned: Double Lumen EBT  Additional Equipment: Arterial line, PA Cath and Ultrasound Guidance Line Placement  Intra-op Plan:   Post-operative Plan: Post-operative intubation/ventilation  Informed Consent: I have reviewed the patients History and Physical, chart, labs and discussed the procedure including the risks, benefits and alternatives for the proposed anesthesia with the patient or authorized representative who has indicated his/her understanding and acceptance.   Dental advisory given  Plan Discussed with: CRNA and Anesthesiologist  Anesthesia Plan Comments:         Anesthesia Quick Evaluation

## 2017-01-24 NOTE — Addendum Note (Signed)
Addendum  created 01/24/17 2032 by Roberts Gaudy, MD   Diagnosis association updated

## 2017-01-24 NOTE — Anesthesia Postprocedure Evaluation (Signed)
Anesthesia Post Note  Patient: Alan Mcguire  Procedure(s) Performed: MINIMALLY INVASIVE MITRAL VALVE REPAIR (MVR) (Right Chest) TRANSESOPHAGEAL ECHOCARDIOGRAM (TEE) (N/A )     Patient location during evaluation: SICU Anesthesia Type: General Level of consciousness: sedated and patient remains intubated per anesthesia plan Pain management: pain level controlled Vital Signs Assessment: post-procedure vital signs reviewed and stable Respiratory status: patient on ventilator - see flowsheet for VS and patient remains intubated per anesthesia plan Cardiovascular status: blood pressure returned to baseline Anesthetic complications: no    Last Vitals:  Vitals:   01/24/17 1800 01/24/17 1900  BP: 119/72 116/69  Pulse: 79 78  Resp: 13 12  Temp: 36.6 C 36.9 C  SpO2: 100% 100%    Last Pain:  Vitals:   01/24/17 1600  TempSrc: Core (Comment)                 Alan Mcguire

## 2017-01-24 NOTE — Progress Notes (Signed)
Recruitment maneuver completed per MD request due to ABG. PC 30, Peep 5, R 10, I-time 3.0 sec. Maneuver aborted after 1 minute due to BP dropping to 60/40. Pt stable at this time, VS improving. RN at bedside.

## 2017-01-24 NOTE — Interval H&P Note (Signed)
History and Physical Interval Note:  01/24/2017 6:47 AM  Alan Mcguire  has presented today for surgery, with the diagnosis of Mitral Regurgitation  The various methods of treatment have been discussed with the patient and family. After consideration of risks, benefits and other options for treatment, the patient has consented to  Procedure(s): MINIMALLY INVASIVE MITRAL VALVE REPAIR (MVR) (Right) TRANSESOPHAGEAL ECHOCARDIOGRAM (TEE) (N/A) as a surgical intervention .  The patient's history has been reviewed, patient examined, no change in status, stable for surgery.  I have reviewed the patient's chart and labs.  Questions were answered to the patient's satisfaction.     Rexene Alberts

## 2017-01-24 NOTE — Anesthesia Procedure Notes (Signed)
Central Venous Catheter Insertion Performed by: Roberts Gaudy, MD, anesthesiologist Start/End11/13/2018 6:50 AM, 01/24/2017 7:00 AM Patient location: Pre-op. Preanesthetic checklist: patient identified, IV checked, site marked, risks and benefits discussed, surgical consent, monitors and equipment checked, pre-op evaluation, timeout performed and anesthesia consent Lidocaine 1% used for infiltration and patient sedated Hand hygiene performed  and maximum sterile barriers used  Catheter size: 8.5 Fr Sheath introducer Procedure performed using ultrasound guided technique. Ultrasound Notes:anatomy identified, needle tip was noted to be adjacent to the nerve/plexus identified, no ultrasound evidence of intravascular and/or intraneural injection and image(s) printed for medical record Attempts: 1 Following insertion, line sutured and dressing applied. Post procedure assessment: blood return through all ports, free fluid flow and no air  Patient tolerated the procedure well with no immediate complications.

## 2017-01-24 NOTE — OR Nursing (Signed)
12:30pm - 45 minute call to SICU charge nurse 13:05pm - 20 minute call to SICU charge nurse

## 2017-01-24 NOTE — Progress Notes (Signed)
Echocardiogram Echocardiogram Transesophageal has been performed.  Alan Mcguire 01/24/2017, 1:20 PM

## 2017-01-24 NOTE — Op Note (Signed)
CARDIOTHORACIC SURGERY OPERATIVE NOTE  Date of Procedure:  01/24/2017  Preoperative Diagnosis: Severe Mitral Regurgitation  Postoperative Diagnosis: Same  Procedure:    Minimally-Invasive Mitral Valve Repair  Complex valvuloplasty including triangular resection of flail segment of posterior leaflet (P2)  Artificial Gore-tex neochord placement x4  Sorin Memo 3D Ring Annuloplasty (size 34 mm, catalog # [id number], serial # [id number])    Surgeon: Valentina Gu. 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 The patient has been remarkably healthy and physically active all of his adult life. Approximately 7 or 8 years ago he was noted to have a systolic murmur on physical exam by his primary care physician, and transthoracic echocardiogram revealed the presence of mitral valve prolapse with mitral regurgitation. He was referred to Dr. [name] whohasfollowed him intermittently ever since. Recently the patient's murmur was noted to be more prominent on physical exam and follow-up transthoracic echocardiogram performed 09/08/2016 revealed mitral valve prolapse with what was felt to be moderate to severe mitral regurgitation. Left ventricular systolic function remained normal with ejection fraction estimated 60-65%. The left ventricle was noted to be mildly dilated. The patient subsequently  underwent transesophageal echocardiogram and diagnostic cardiac catheterization. TEE performed 09/21/2016 confirmed the presence of myxomatous degenerative disease of the mitral valve with bileaflet prolapse including a flail segment of the posterior leaflet and severe mitral regurgitation. Cardiac catheterization performed 10/28/2016 revealed normal coronary arteries with no significant coronary artery disease. There were normal right heart pressures with mildly elevated left ventricular end-diastolic pressure and normal left ventricular systolic function. The patient was referred for surgical consultation.  The patient has been seen in consultation and counseled at length regarding the indications, risks and potential benefits of surgery.  All questions have been answered, and the patient provides full informed consent for the operation as described.      DETAILS OF THE OPERATIVE PROCEDURE  Preparation:  The patient is brought to the operating room on the above mentioned date and central monitoring was established by the anesthesia team including placement of Swan-Ganz catheter through the left internal jugular vein.  A radial arterial line is placed. The patient is placed in the supine position on the operating table.  Intravenous antibiotics are administered. General endotracheal anesthesia is induced uneventfully. The patient is initially intubated using a dual lumen endotracheal tube.  A Foley catheter is placed.  Baseline transesophageal echocardiogram was performed.  Findings were notable for fibroelastic deficiency type degenerative disease with multiple ruptured primary chordae tendineae from the middle scallop (P2) of the posterior leaflet.  There was severe mitral regurgitation coursing anteriorly around the left atrium.  The left ventricle was dilated.  There was normal left ventricular systolic function.  There was mild left atrial enlargement.  There was trace central aortic  insufficiency.  There was normal right ventricular size and function.  There was trace central tricuspid regurgitation.  A soft roll is placed  behind the patient's left scapula and the neck gently extended and turned to the left.   The patient's right neck, chest, abdomen, both groins, and both lower extremities are prepared and draped in a sterile manner. A time out procedure is performed.  Surgical Approach:  A right miniature anterolateral thoracotomy incision is performed. The incision is placed just lateral to and superior to the right nipple. The pectoralis major muscle is retracted medially and completely preserved. The right pleural space is entered through the 4th intercostal space. A soft tissue retractor is placed.  Two 11 mm ports are placed through separate stab incisions inferiorly. The right pleural space is insufflated continuously with carbon dioxide gas through the posterior port during the remainder of the operation.  A pledgeted sutures placed through the dome of the right hemidiaphragm and retracted inferiorly to facilitate exposure.  A longitudinal incision is made in the pericardium 3 cm anterior to the phrenic nerve and silk traction sutures are placed on either side of the incision for exposure.   Extracorporeal Cardiopulmonary Bypass and Myocardial Protection:  A small incision is made in the right inguinal crease and the anterior surface of the right common femoral artery and right common femoral vein are identified.  The patient is placed in Trendelenburg position. The right internal jugular vein is cannulated with Seldinger technique and a guidewire advanced into the right atrium. The patient is heparinized systemically. The right internal jugular vein is cannulated with a 14 [place] pediatric femoral venous cannula. Pursestring sutures are placed on the anterior surface of the right common femoral vein and right common femoral artery. The right common femoral vein is  cannulated with the Seldinger technique and a guidewire is advanced under transesophageal echocardiogram guidance through the right atrium. The femoral vein is cannulated with a long 22 French femoral venous cannula. The right common femoral artery is cannulated with Seldinger technique and a flexible guidewire is advanced until it can be appreciated intraluminally in the descending thoracic aorta on transesophageal echocardiogram. The femoral artery is cannulated with an 18 French femoral arterial cannula.  Adequate heparinization is verified.     The entire pre-bypass portion of the operation was notable for stable hemodynamics.  Cardiopulmonary bypass was begun.  Vacuum assist venous drainage is utilized. The incision in the pericardium is extended in both directions. Venous drainage and exposure are notably excellent. A retrograde cardioplegia cannula is placed through the right atrium into the coronary sinus using transesophageal echocardiogram guidance.  An antegrade cardioplegia cannula is placed in the ascending aorta.    The patient is cooled to 28C systemic temperature.  The aortic cross clamp is applied and cardioplegia is delivered initially in an antegrade fashion through the aortic root using modified [place] cold blood cardioplegia ([place] blood cardioplegia protocol).   The initial cardioplegic arrest is rapid with early diastolic arrest.  Repeat doses of cardioplegia are administered at 90 minutes and every 30 minutes thereafter through the coronary sinus catheter in order to maintain completely flat electrocardiogram.  Myocardial protection was felt to be excellent.   Mitral Valve Repair:  A left atriotomy incision was performed through the interatrial groove and extended partially across the back wall of the left atrium after opening the oblique sinus inferiorly.  The mitral valve is exposed using a self-retaining retractor.  The mitral valve was inspected and notable for  fibroelastic deficiency type degenerative disease with multiple ruptured primary chordae tendineae to the P2 segment of the posterior leaflet.  All of  the ruptured cords originate from the anterior papillary muscle.  The remainder of the valve appeared essentially normal.  There is no calcification.  Interrupted 2-0 Ethibond horizontal mattress sutures are placed circumferentially around the entire mitral valve annulus. The sutures will ultimately be utilized for ring annuloplasty, and at this juncture there are utilized to suspend the valve symmetrically.  The flail segment of P2 was repaired using a simple triangular resection.  Approximately 20% of the total surface area of P2 was resected.  The intervening vertical defect in P2 was closed using interrupted simple CV 5 Gore-Tex suture.  Artificial neochord placement was performed using Chord-X multi-strand CV-4 Goretex pre-measured loops.  The appropriate cord length was measured from corresponding normal length primary cords from the P1 segment of the posterior leaflet. The papillary muscle suture of the Chord-X multi-strand suture was placed through the head of the anterior papillary muscle in a horizontal mattress fashion and tied over Teflon felt pledgets. Each of the three pre-measured loops were then reimplanted into the free margin of the P2 segment of the posterior leaflet.    The valve was tested with saline and appeared competent even without ring annuloplasty complete. The valve was sized to a 34 mm annuloplasty ring, based upon the transverse distance between the left and right commissures and the height of the anterior leaflet, corresponding to a size just slightly larger than the overall surface area of the anterior leaflet.  A Sorin Memo 3D annuloplasty ring (size 34 mm, catalog #[id number], serial [id number]) was secured in place uneventfully. All ring sutures were secured using a Cor-knot device.    The valve was tested with saline and appeared  competent. There is no residual leak. There was a broad, symmetrical line of coaptation of the anterior and posterior leaflet which was confirmed using the blue ink test.  Rewarming is begun.   Procedure Completion:  The atriotomy was closed using a 2-layer closure of running 3-0 Prolene suture after placing a sump drain across the mitral valve to serve as a left ventricular vent.  One final dose of warm retrograde "reanimation dose" cardioplegia was administered retrograde through the coronary sinus catheter while all air was evacuated through the aortic root.  The aortic cross clamp was removed after a total cross clamp time of 107 minutes.  Epicardial pacing wires are fixed to the inferior wall of the right ventricule and to the right atrial appendage. The patient is rewarmed to 37C temperature. The left ventricular vent is removed.  The patient is ventilated and flow volumes turndown while the mitral valve repair is inspected using transesophageal echocardiogram. The valve repair appears intact with no residual leak. The antegrade cardioplegia cannula is now removed. The patient is weaned and disconnected from cardiopulmonary bypass.  The patient's rhythm at separation from bypass was sinus.  The patient was weaned from bypass without any inotropic support. Total cardiopulmonary bypass time for the operation was 148 minutes.  Followup transesophageal echocardiogram performed after separation from bypass revealed a well-seated annuloplasty ring in the mitral position with a normal functioning mitral valve. There was no residual leak.  Left ventricular function was unchanged from preoperatively.    The femoral arterial and venous cannulae were removed uneventfully. There was a palpable pulse in the distal right common femoral artery after removal of the cannula. Protamine was administered to reverse the anticoagulation. The right internal jugular cannula was removed and manual pressure held on the neck  for 15 minutes.  Single lung ventilation was  begun. The atriotomy closure was inspected for hemostasis. The pericardial sac was drained using a 28 French Bard drain placed through the anterior port incision.  The right pleural space is irrigated with saline solution and inspected for hemostasis. The right pleural space was drained using a 28 French Bard drain placed through the posterior port incision. The miniature thoracotomy incision was closed in multiple layers in routine fashion. The right groin incision was inspected for hemostasis and closed in multiple layers in routine fashion.  The post-bypass portion of the operation was notable for stable rhythm and hemodynamics.  No blood products were administered during the operation.   Disposition:  The patient tolerated the procedure well.  The patient was reintubated using a single lumen endotracheal tube and subsequently transported to the surgical intensive care unit in stable condition. There were no intraoperative complications. All sponge instrument and needle counts are verified correct at completion of the operation.     Valentina Gu. Roxy Manns MD 01/24/2017 1:07 PM

## 2017-01-25 ENCOUNTER — Encounter (HOSPITAL_COMMUNITY): Payer: Self-pay | Admitting: Thoracic Surgery (Cardiothoracic Vascular Surgery)

## 2017-01-25 ENCOUNTER — Inpatient Hospital Stay (HOSPITAL_COMMUNITY): Payer: 59

## 2017-01-25 LAB — BASIC METABOLIC PANEL
ANION GAP: 4 — AB (ref 5–15)
BUN: 10 mg/dL (ref 6–20)
CALCIUM: 7.6 mg/dL — AB (ref 8.9–10.3)
CO2: 24 mmol/L (ref 22–32)
Chloride: 107 mmol/L (ref 101–111)
Creatinine, Ser: 0.88 mg/dL (ref 0.61–1.24)
GLUCOSE: 137 mg/dL — AB (ref 65–99)
POTASSIUM: 3.8 mmol/L (ref 3.5–5.1)
SODIUM: 135 mmol/L (ref 135–145)

## 2017-01-25 LAB — CBC
HCT: 41 % (ref 39.0–52.0)
HEMATOCRIT: 42.1 % (ref 39.0–52.0)
HEMOGLOBIN: 13.9 g/dL (ref 13.0–17.0)
Hemoglobin: 13.9 g/dL (ref 13.0–17.0)
MCH: 29 pg (ref 26.0–34.0)
MCH: 28.6 pg (ref 26.0–34.0)
MCHC: 33.9 g/dL (ref 30.0–36.0)
MCHC: 33 g/dL (ref 30.0–36.0)
MCV: 85.4 fL (ref 78.0–100.0)
MCV: 86.6 fL (ref 78.0–100.0)
PLATELETS: 132 10*3/uL — AB (ref 150–400)
Platelets: 129 10*3/uL — ABNORMAL LOW (ref 150–400)
RBC: 4.8 MIL/uL (ref 4.22–5.81)
RBC: 4.86 MIL/uL (ref 4.22–5.81)
RDW: 13.3 % (ref 11.5–15.5)
RDW: 13.6 % (ref 11.5–15.5)
WBC: 12.1 10*3/uL — AB (ref 4.0–10.5)
WBC: 11.9 10*3/uL — ABNORMAL HIGH (ref 4.0–10.5)

## 2017-01-25 LAB — CREATININE, SERUM
Creatinine, Ser: 1.11 mg/dL (ref 0.61–1.24)
GFR calc Af Amer: >60 mL/min (ref >60)
GFR calc non Af Amer: >60 mL/min (ref >60)

## 2017-01-25 LAB — POCT I-STAT, CHEM 8
BUN: 17 mg/dL (ref 6–20)
CREATININE: 0.9 mg/dL (ref 0.61–1.24)
Calcium, Ion: 1.17 mmol/L (ref 1.15–1.40)
Chloride: 103 mmol/L (ref 101–111)
GLUCOSE: 139 mg/dL — AB (ref 65–99)
HEMATOCRIT: 40 % (ref 39.0–52.0)
Hemoglobin: 13.6 g/dL (ref 13.0–17.0)
POTASSIUM: 3.9 mmol/L (ref 3.5–5.1)
Sodium: 141 mmol/L (ref 135–145)
TCO2: 20 mmol/L — AB (ref 22–32)

## 2017-01-25 LAB — BLOOD GAS, ARTERIAL
ACID-BASE DEFICIT: 0.5 mmol/L (ref 0.0–2.0)
BICARBONATE: 24 mmol/L (ref 20.0–28.0)
DRAWN BY: 347621
O2 CONTENT: 2.5 L/min
O2 SAT: 97.6 %
PATIENT TEMPERATURE: 98.6
PO2 ART: 95.8 mmHg (ref 83.0–108.0)
pCO2 arterial: 42.1 mmHg (ref 32.0–48.0)
pH, Arterial: 7.374 (ref 7.350–7.450)

## 2017-01-25 LAB — MAGNESIUM
MAGNESIUM: 2.3 mg/dL (ref 1.7–2.4)
Magnesium: 2.2 mg/dL (ref 1.7–2.4)

## 2017-01-25 LAB — GLUCOSE, CAPILLARY
GLUCOSE-CAPILLARY: 110 mg/dL — AB (ref 65–99)
Glucose-Capillary: 105 mg/dL — ABNORMAL HIGH (ref 65–99)
Glucose-Capillary: 110 mg/dL — ABNORMAL HIGH (ref 65–99)
Glucose-Capillary: 112 mg/dL — ABNORMAL HIGH (ref 65–99)
Glucose-Capillary: 146 mg/dL — ABNORMAL HIGH (ref 65–99)

## 2017-01-25 MED ORDER — INFLUENZA VAC SPLIT QUAD 0.5 ML IM SUSY
0.5000 mL | PREFILLED_SYRINGE | INTRAMUSCULAR | Status: AC
  Administered 2017-01-28: 0.5 mL via INTRAMUSCULAR
  Filled 2017-01-25: qty 0.5

## 2017-01-25 MED ORDER — PATIENT'S GUIDE TO USING COUMADIN BOOK
Freq: Once | Status: AC
  Administered 2017-01-25: 1
  Filled 2017-01-25: qty 1

## 2017-01-25 MED ORDER — CHLORHEXIDINE GLUCONATE CLOTH 2 % EX PADS
6.0000 | MEDICATED_PAD | Freq: Every day | CUTANEOUS | Status: DC
  Administered 2017-01-26 (×2): 6 via TOPICAL

## 2017-01-25 MED ORDER — WARFARIN - PHYSICIAN DOSING INPATIENT
Freq: Every day | Status: DC
  Administered 2017-01-25: 1

## 2017-01-25 MED ORDER — WARFARIN SODIUM 2.5 MG PO TABS
2.5000 mg | ORAL_TABLET | Freq: Every day | ORAL | Status: DC
  Administered 2017-01-25: 2.5 mg via ORAL
  Filled 2017-01-25: qty 1

## 2017-01-25 MED ORDER — FUROSEMIDE 10 MG/ML IJ SOLN
20.0000 mg | Freq: Two times a day (BID) | INTRAMUSCULAR | Status: AC
Start: 2017-01-25 — End: 2017-01-26
  Administered 2017-01-25 – 2017-01-26 (×3): 20 mg via INTRAVENOUS
  Filled 2017-01-25 (×3): qty 2

## 2017-01-25 MED ORDER — SODIUM CHLORIDE 0.9% FLUSH: 10.0000 mL | INTRAVENOUS | Status: DC | PRN

## 2017-01-25 MED ORDER — POTASSIUM CHLORIDE 10 MEQ/50ML IV SOLN
10.0000 meq | INTRAVENOUS | Status: AC
  Administered 2017-01-25 (×3): 10 meq via INTRAVENOUS
  Filled 2017-01-25 (×3): qty 50

## 2017-01-25 MED ORDER — METOPROLOL TARTRATE 25 MG PO TABS
25.0000 mg | ORAL_TABLET | Freq: Two times a day (BID) | ORAL | Status: DC
  Administered 2017-01-25 – 2017-01-28 (×7): 25 mg via ORAL
  Filled 2017-01-25 (×7): qty 1

## 2017-01-25 MED ORDER — KETOROLAC TROMETHAMINE 15 MG/ML IJ SOLN
15.0000 mg | Freq: Four times a day (QID) | INTRAMUSCULAR | Status: AC
  Administered 2017-01-25 – 2017-01-26 (×5): 15 mg via INTRAVENOUS
  Filled 2017-01-25 (×5): qty 1

## 2017-01-25 MED ORDER — SODIUM CHLORIDE 0.9% FLUSH
10.0000 mL | Freq: Two times a day (BID) | INTRAVENOUS | Status: DC
Start: 2017-01-25 — End: 2017-01-28

## 2017-01-25 MED ORDER — POTASSIUM CHLORIDE 10 MEQ/50ML IV SOLN
10.0000 meq | INTRAVENOUS | Status: AC
  Administered 2017-01-25 – 2017-01-26 (×3): 10 meq via INTRAVENOUS
  Filled 2017-01-25 (×3): qty 50

## 2017-01-25 NOTE — Progress Notes (Signed)
Anesthesiology Follow-up:  Awake and alert, in good spirits, having L. Chest incisional soreness, otherwise feels well.  VS: T-37.5 BP- 114/67 HR 84 (SR) RR- 15 O2 Sat 100% on 2L O2  K- 3.8 glucose- 137 BUN/Cr. 10/0.88 H/H- 13.9/41 Platelets- 132,000  Extubated 3 hours post-op  61 year old male one day S/P MV repair for flail P2. Stable post-op course. No apparent complications

## 2017-01-25 NOTE — Progress Notes (Signed)
      [address]       [place] [address]             [phone]     POD # 1 mitral repair  Sleeping   BP 114/64   Pulse 78   Temp 98.6 F (37 C) (Oral)   Resp (!) 27   Ht 5\' 11"  (1.803 m)   Wt 182 lb 12.2 oz (82.9 kg)   SpO2 96%   BMI 25.49 kg/m    Intake/Output Summary (Last 24 hours) at 01/25/2017 2056 Last data filed at 01/25/2017 2000 Gross per 24 hour  Intake 1080 ml  Output 4190 ml  Net -3110 ml   K= 3.9  Doing well POD # 1  Novaleigh Kohlman C. Roxan Hockey, MD [facility] [phone]

## 2017-01-25 NOTE — Addendum Note (Signed)
Addendum  created 01/25/17 1151 by Roberts Gaudy, MD   Sign clinical note

## 2017-01-25 NOTE — Care Management Note (Signed)
Case Management Note  Patient Details  Name: Alan Mcguire MRN: [id number] Date of Birth: [birth year]  Subjective/Objective:   From home with spouse , who is a Therapist, sports who will be able to assist him at discharge, he is POD 1 MVR, conts with two chest tubes to suction, diurese, start coumadin. He has PCP and medication coverage.                   Action/Plan: NCM will follow for dc needs.   Expected Discharge Date:                  Expected Discharge Plan:  Home/Self Care  In-House Referral:     Discharge planning Services     Post Acute Care Choice:    Choice offered to:     DME Arranged:    DME Agency:     HH Arranged:    HH Agency:     Status of Service:  In process, will continue to follow  If discussed at Long Length of Stay Meetings, dates discussed:    Additional Comments:  Zenon Mayo, RN 01/25/2017, 2:38 PM

## 2017-01-25 NOTE — Progress Notes (Signed)
[address]       [place] [address]             [phone]        CARDIOTHORACIC SURGERY PROGRESS NOTE   R1 Day Post-Op Procedure(s) (LRB): MINIMALLY INVASIVE MITRAL VALVE REPAIR (MVR) (Right) TRANSESOPHAGEAL ECHOCARDIOGRAM (TEE) (N/A)  Subjective: Looks good.  Mild soreness in chest.  Objective: Vital signs: BP Readings from Last 1 Encounters:  01/25/17 118/70   Pulse Readings from Last 1 Encounters:  01/25/17 92   Resp Readings from Last 1 Encounters:  01/25/17 (!) 27   Temp Readings from Last 1 Encounters:  01/25/17 99.5 F (37.5 C) (Core (Comment))    Hemodynamics: PAP: (11-25)/(1-12) 23/11 CO:  [3.4 L/min-11.6 L/min] 11.6 L/min CI:  [1.7 L/min/m2-5.8 L/min/m2] 5.8 L/min/m2  Physical Exam:  Rhythm:   sinus  Breath sounds: clear  Heart sounds:  RRR  Incisions:  Dressing dry, intact  Abdomen:  Soft, non-distended, non-tender  Extremities:  Warm, well-perfused  Chest tubes:  low volume thin serosanguinous output, no air leak    Intake/Output from previous day: 11/13 0701 - 11/14 0700 In: 6317.8 [I.V.:3952.8; Blood:1285; NG/GT:30; IV Piggyback:1050] Out: 4010 [Urine:3210; Blood:1225; Chest Tube:430] Intake/Output this shift: No intake/output data recorded.  Lab Results:  CBC: Recent Labs    01/24/17 1943 01/24/17 1949 01/25/17 0311  WBC 9.5  --  12.1*  HGB 13.3 12.6* 13.9  HCT 40.2 37.0* 41.0  PLT 129*  --  132*    BMET:  Recent Labs    01/23/17 1519  01/24/17 1949 01/25/17 0311  NA 138   < > 140 135  K 3.7   < > 4.4 3.8  CL 108   < > 106 107  CO2 22  --   --  24  GLUCOSE 105*   < > 133* 137*  BUN 14   < > 14 10  CREATININE 0.88   < > 0.80 0.88  CALCIUM 9.3  --   --  7.6*   < > = values in this interval not displayed.     PT/INR:   Recent Labs    01/24/17 1330  LABPROT 15.4*  INR 1.23    CBG (last 3)  Recent Labs    01/24/17 2350 01/25/17 0501 01/25/17 0756  GLUCAP 129* 110* 112*    ABG   Component Value Date/Time   PHART 7.374 01/25/2017 0350   PCO2ART 42.1 01/25/2017 0350   PO2ART 95.8 01/25/2017 0350   HCO3 24.0 01/25/2017 0350   TCO2 24 01/24/2017 1949   ACIDBASEDEF 0.5 01/25/2017 0350   O2SAT 97.6 01/25/2017 0350    CXR: PORTABLE CHEST 1 VIEW  COMPARISON:  Chest x-ray of January 24, 2017  FINDINGS: There has been extubation of the trachea and esophagus. The lungs are adequately inflated. There is no pneumothorax or significant pleural effusion. The retrocardiac region on the left is dense. The cardiac silhouette is mildly enlarged. The pulmonary vascularity is not engorged. The trachea and esophagus have been extubated. There remains a right-sided chest tube whose tip projects over the posterior aspect of the fourth rib. The Swan-Ganz catheter tip projects in the distal main pulmonary outflow tract.  IMPRESSION: Mild retrocardiac atelectasis on the left. No pneumothorax or large pleural effusion. Stable cardiomegaly without significant pulmonary vascular congestion. The support tubes are in reasonable position.   Electronically Signed   By: David  [name] M.D.   On: 01/25/2017 07:39  Assessment/Plan: S/P Procedure(s) (  LRB): MINIMALLY INVASIVE MITRAL VALVE REPAIR (MVR) (Right) TRANSESOPHAGEAL ECHOCARDIOGRAM (TEE) (N/A)  Doing well POD1 Maintaining NSR w/ stable hemodynamics, no drips Breathing comfortably w/ O2 sats 99% on 2 L/min and CXR clear   Mobilize  D/C lines and foley  Diuresis  Start Coumadin slowly  Rexene Alberts, MD 01/25/2017 9:13 AM

## 2017-01-26 ENCOUNTER — Other Ambulatory Visit: Payer: Self-pay

## 2017-01-26 ENCOUNTER — Inpatient Hospital Stay (HOSPITAL_COMMUNITY): Payer: 59

## 2017-01-26 ENCOUNTER — Encounter (HOSPITAL_COMMUNITY): Payer: Self-pay | Admitting: General Practice

## 2017-01-26 LAB — BASIC METABOLIC PANEL
ANION GAP: 6 (ref 5–15)
BUN: 13 mg/dL (ref 6–20)
CHLORIDE: 102 mmol/L (ref 101–111)
CO2: 28 mmol/L (ref 22–32)
CREATININE: 1.03 mg/dL (ref 0.61–1.24)
Calcium: 8 mg/dL — ABNORMAL LOW (ref 8.9–10.3)
GFR calc non Af Amer: >60 mL/min (ref >60)
Glucose, Bld: 110 mg/dL — ABNORMAL HIGH (ref 65–99)
POTASSIUM: 4.2 mmol/L (ref 3.5–5.1)
SODIUM: 136 mmol/L (ref 135–145)

## 2017-01-26 LAB — CBC
HCT: 41.1 % (ref 39.0–52.0)
HEMOGLOBIN: 13.4 g/dL (ref 13.0–17.0)
MCH: 28.3 pg (ref 26.0–34.0)
MCHC: 32.6 g/dL (ref 30.0–36.0)
MCV: 86.7 fL (ref 78.0–100.0)
Platelets: 128 10*3/uL — ABNORMAL LOW (ref 150–400)
RBC: 4.74 MIL/uL (ref 4.22–5.81)
RDW: 13.7 % (ref 11.5–15.5)
WBC: 9.1 10*3/uL (ref 4.0–10.5)

## 2017-01-26 LAB — GLUCOSE, CAPILLARY
GLUCOSE-CAPILLARY: 110 mg/dL — AB (ref 65–99)
Glucose-Capillary: 136 mg/dL — ABNORMAL HIGH (ref 65–99)

## 2017-01-26 LAB — PROTIME-INR
INR: 1.17
PROTHROMBIN TIME: 14.8 s (ref 11.4–15.2)

## 2017-01-26 MED ORDER — WARFARIN SODIUM 5 MG PO TABS
5.0000 mg | ORAL_TABLET | Freq: Every day | ORAL | Status: DC
  Administered 2017-01-26 – 2017-01-27 (×2): 5 mg via ORAL
  Filled 2017-01-26 (×2): qty 1

## 2017-01-26 MED ORDER — POTASSIUM CHLORIDE CRYS ER 20 MEQ PO TBCR
20.0000 meq | EXTENDED_RELEASE_TABLET | Freq: Every day | ORAL | Status: AC
Start: 2017-01-26 — End: 2017-01-28
  Administered 2017-01-26 – 2017-01-28 (×3): 20 meq via ORAL
  Filled 2017-01-26 (×3): qty 1

## 2017-01-26 MED ORDER — FUROSEMIDE 40 MG PO TABS
40.0000 mg | ORAL_TABLET | Freq: Every day | ORAL | Status: AC
  Administered 2017-01-27 – 2017-01-28 (×2): 40 mg via ORAL
  Filled 2017-01-26 (×2): qty 1

## 2017-01-26 MED ORDER — SODIUM CHLORIDE 0.9% FLUSH
3.0000 mL | Freq: Two times a day (BID) | INTRAVENOUS | Status: DC
  Administered 2017-01-26 – 2017-01-28 (×5): 3 mL via INTRAVENOUS

## 2017-01-26 MED ORDER — MOVING RIGHT ALONG BOOK
Freq: Once | Status: DC
  Filled 2017-01-26: qty 1

## 2017-01-26 MED ORDER — SODIUM CHLORIDE 0.9 % IV SOLN: 250.0000 mL | INTRAVENOUS | Status: DC | PRN

## 2017-01-26 MED ORDER — SODIUM CHLORIDE 0.9% FLUSH
3.0000 mL | INTRAVENOUS | Status: DC | PRN
Start: 2017-01-26 — End: 2017-01-28

## 2017-01-26 MED ORDER — ASPIRIN EC 81 MG PO TBEC
81.0000 mg | DELAYED_RELEASE_TABLET | Freq: Every day | ORAL | Status: DC
  Administered 2017-01-26 – 2017-01-28 (×3): 81 mg via ORAL
  Filled 2017-01-26 (×3): qty 1

## 2017-01-26 MED FILL — Potassium Chloride Inj 2 mEq/ML: INTRAVENOUS | Qty: 20 | Status: AC

## 2017-01-26 MED FILL — Heparin Sodium (Porcine) Inj 1000 Unit/ML: INTRAMUSCULAR | Qty: 30 | Status: AC

## 2017-01-26 MED FILL — Magnesium Sulfate Inj 50%: INTRAMUSCULAR | Qty: 10 | Status: AC

## 2017-01-26 MED FILL — Heparin Sodium (Porcine) Inj 1000 Unit/ML: INTRAMUSCULAR | Qty: 2500 | Status: AC

## 2017-01-26 NOTE — Progress Notes (Addendum)
[address]       [place] [address]             [phone]      2 Days Post-Op Procedure(s) (LRB): MINIMALLY INVASIVE MITRAL VALVE REPAIR (MVR) (Right) TRANSESOPHAGEAL ECHOCARDIOGRAM (TEE) (N/A) Subjective: Feels remarkably well  Objective: Vital signs in last 24 hours: Temp:  [97.6 F (36.4 C)-98.7 F (37.1 C)] 97.8 F (36.6 C) (11/15 1359) Pulse Rate:  [60-92] 82 (11/15 1359) Cardiac Rhythm: Heart block;Normal sinus rhythm;Other (Comment) (11/15 1359) Resp:  [15] 15 (11/15 1359) BP: (92-133)/(57-96) 118/88 (11/15 1359) SpO2:  [93 %-99 %] 94 % (11/15 1359) Weight:  [182 lb 12.2 oz (82.9 kg)] 182 lb 12.2 oz (82.9 kg) (11/15 0500)  Hemodynamic parameters for last 24 hours:    Intake/Output from previous day: 11/14 0701 - 11/15 0700 In: 730 [I.V.:480; IV Piggyback:250] Out: 3085 [Urine:2670; Chest Tube:415] Intake/Output this shift: Total I/O In: 410 [P.O.:360; IV Piggyback:50] Out: 300 [Urine:300]  General appearance: alert, cooperative and no distress Heart: regular rate and rhythm and no rub or murmur Lungs: clear to auscultation bilaterally Abdomen: benign Extremities: no edema Wound: incis healing well  Lab Results: Recent Labs    01/25/17 1850 01/26/17 0420  WBC 11.9* 9.1  HGB 13.9 13.4  HCT 42.1 41.1  PLT 129* 128*   BMET:  Recent Labs    01/25/17 0311  01/25/17 1819 01/26/17 0420  NA 135  --  141 136  K 3.8  --  3.9 4.2  CL 107  --  103 102  CO2 24  --   --  28  GLUCOSE 137*  --  139* 110*  BUN 10  --  17 13  CREATININE 0.88   < > 0.90 1.03  CALCIUM 7.6*  --   --  8.0*   < > = values in this interval not displayed.    PT/INR:  Recent Labs    01/26/17 0420  LABPROT 14.8  INR 1.17   ABG    Component Value Date/Time   PHART 7.374 01/25/2017 0350   HCO3 24.0 01/25/2017 0350   TCO2 20 (L) 01/25/2017 1819   ACIDBASEDEF 0.5 01/25/2017 0350   O2SAT 97.6 01/25/2017 0350   CBG (last 3)  Recent Labs   01/25/17 1943 01/25/17 2333 01/26/17 0433  GLUCAP 136* 105* 110*    Meds Scheduled Meds: . acetaminophen  1,000 mg Oral Q6H  . aspirin EC  81 mg Oral Daily  . bisacodyl  10 mg Oral Daily   Or  . bisacodyl  10 mg Rectal Daily  . Chlorhexidine Gluconate Cloth  6 each Topical Daily  . docusate sodium  200 mg Oral Daily  . [START ON 01/27/2017] furosemide  40 mg Oral Daily  . [START ON 01/28/2017] Influenza vac split quadrivalent PF  0.5 mL Intramuscular Tomorrow-1000  . mouth rinse  15 mL Mouth Rinse BID  . metoprolol tartrate  25 mg Oral BID  . moving right along book   Does not apply Once  . pantoprazole  40 mg Oral Daily  . potassium chloride  20 mEq Oral Daily  . sodium chloride flush  10-40 mL Intracatheter Q12H  . sodium chloride flush  3 mL Intravenous Q12H  . warfarin  5 mg Oral q1800  . Warfarin - Physician Dosing Inpatient   Does not apply q1800   Continuous Infusions: . sodium chloride    . sodium chloride     PRN  Meds:.sodium chloride, metoprolol tartrate, morphine injection, ondansetron (ZOFRAN) IV, oxyCODONE, sodium chloride flush, sodium chloride flush, traMADol  Xrays Dg Chest Port 1 View  Result Date: 01/26/2017 CLINICAL DATA:  Atelectasis.  Sore chest.  Mitral valve replacement. EXAM: PORTABLE CHEST 1 VIEW COMPARISON:  01/25/2017 . FINDINGS: Left IJ sheath in stable position. Interim removal of Swan-Ganz catheter. Right chest tube in stable position. No pneumothorax . Prior cardiac valve replacement. Stable cardiomegaly. No pulmonary venous congestion. Low lung volumes with mild basilar atelectasis. IMPRESSION: 1. Interim removal of Swan-Ganz catheter. Left IJ sheath in stable position. Right chest tube in stable position. No pneumothorax. 2. Stable cardiomegaly. No pulmonary venous congestion. Low lung volumes with mild basilar atelectasis . Electronically Signed   By: Marcello Moores  Register   On: 01/26/2017 08:35   Dg Chest Port 1 View  Result Date:  01/25/2017 CLINICAL DATA:  Status post minimally invasive mitral valve repair. Mitral valve replacement EXAM: PORTABLE CHEST 1 VIEW COMPARISON:  Chest x-ray of January 24, 2017 FINDINGS: There has been extubation of the trachea and esophagus. The lungs are adequately inflated. There is no pneumothorax or significant pleural effusion. The retrocardiac region on the left is dense. The cardiac silhouette is mildly enlarged. The pulmonary vascularity is not engorged. The trachea and esophagus have been extubated. There remains a right-sided chest tube whose tip projects over the posterior aspect of the fourth rib. The Swan-Ganz catheter tip projects in the distal main pulmonary outflow tract. IMPRESSION: Mild retrocardiac atelectasis on the left. No pneumothorax or large pleural effusion. Stable cardiomegaly without significant pulmonary vascular congestion. The support tubes are in reasonable position. Electronically Signed   By: David  [name] M.D.   On: 01/25/2017 07:39    Assessment/Plan: S/P Procedure(s) (LRB): MINIMALLY INVASIVE MITRAL VALVE REPAIR (MVR) (Right) TRANSESOPHAGEAL ECHOCARDIOGRAM (TEE) (N/A)  1 excellent overall progress 2 hemodyn stable in sinus rhythm with some PVC's, K/ recent Mg++ are in normal range, cont current beta blocker and monitor 3 renal fxn is normal, excellent spontaneous diuresis 4 not anemic, mild thrombocytopenia- monitor trends 5 INR 1.17- slow coumadin load 6 routine pulm toilet/cardiac rehab- he is now on 4E 7 sugars are fine , no DM    LOS: 2 days    GOLD,WAYNE E 01/26/2017   I have seen and examined the patient and agree with the assessment and plan as outlined.  Making good progress.  Anticipate possible D/C home 2-3 days if he continues to do well  Rexene Alberts, MD 01/26/2017

## 2017-01-26 NOTE — Progress Notes (Signed)
Epicardial wires pulled per MD order at 1115am.  Pt on bedrest until 1230.  Vitals done and documented q52min during bedrest.  No complications.  Emotional support given to pt & to family.  Will continue to monitor closely and update as needed.

## 2017-01-27 LAB — PROTIME-INR
INR: 1.08
Prothrombin Time: 13.9 seconds (ref 11.4–15.2)

## 2017-01-27 LAB — BASIC METABOLIC PANEL
Anion gap: 5 (ref 5–15)
BUN: 13 mg/dL (ref 6–20)
CHLORIDE: 104 mmol/L (ref 101–111)
CO2: 27 mmol/L (ref 22–32)
Calcium: 8 mg/dL — ABNORMAL LOW (ref 8.9–10.3)
Creatinine, Ser: 0.94 mg/dL (ref 0.61–1.24)
GFR calc Af Amer: >60 mL/min (ref >60)
GFR calc non Af Amer: >60 mL/min (ref >60)
GLUCOSE: 111 mg/dL — AB (ref 65–99)
POTASSIUM: 4.1 mmol/L (ref 3.5–5.1)
Sodium: 136 mmol/L (ref 135–145)

## 2017-01-27 LAB — CBC
HEMATOCRIT: 40.2 % (ref 39.0–52.0)
HEMOGLOBIN: 13.3 g/dL (ref 13.0–17.0)
MCH: 29 pg (ref 26.0–34.0)
MCHC: 33.1 g/dL (ref 30.0–36.0)
MCV: 87.6 fL (ref 78.0–100.0)
Platelets: 153 10*3/uL (ref 150–400)
RBC: 4.59 MIL/uL (ref 4.22–5.81)
RDW: 13.8 % (ref 11.5–15.5)
WBC: 8.2 10*3/uL (ref 4.0–10.5)

## 2017-01-27 NOTE — Progress Notes (Signed)
CARDIAC REHAB PHASE I   PRE:  Rate/Rhythm: 93 SR  BP:  Supine: 112/71  Sitting:   Standing:    SaO2: 96%RA  MODE:  Ambulation: 700 ft   POST:  Rate/Rhythm: 99 SR  BP:  Supine: 134/80  Sitting:   Standing:    SaO2: 92%RA 1025-1125 Pt walked 700 ft with rolling walker and minimal asst. Does not think he will need rolling walker for home use. Education completed with pt and wife who voiced understanding. Reviewed sternal precautions, ex ed, watching sodium and vitamin K. Encouraged pt to watch Coumadin and discharge videos. Wrote down how to view. Discussed CRP 2 and will refer to [place]. Encouraged IS. To bathroom after walk and then to bed.   Graylon Good, RN BSN  01/27/2017 11:21 AM

## 2017-01-27 NOTE — Progress Notes (Addendum)
      [address]       [place] [address]             [phone]      3 Days Post-Op Procedure(s) (LRB): MINIMALLY INVASIVE MITRAL VALVE REPAIR (MVR) (Right) TRANSESOPHAGEAL ECHOCARDIOGRAM (TEE) (N/A) Subjective: Feels good this morning. No issues overnight.   Objective: Vital signs in last 24 hours: Temp:  [97.8 F (36.6 C)-98.7 F (37.1 C)] 98.6 F (37 C) (11/16 0456) Pulse Rate:  [60-101] 93 (11/16 0456) Cardiac Rhythm: Heart block;Normal sinus rhythm (11/16 0700) Resp:  [15-20] 18 (11/16 0456) BP: (110-130)/(59-88) 110/79 (11/16 0456) SpO2:  [93 %-99 %] 96 % (11/16 0456) Weight:  [176 lb 6.4 oz (80 kg)] 176 lb 6.4 oz (80 kg) (11/16 0456)     Intake/Output from previous day: 11/15 0701 - 11/16 0700 In: 770 [P.O.:720; IV Piggyback:50] Out: 1400 [Urine:1250; Chest Tube:150] Intake/Output this shift: No intake/output data recorded.  General appearance: alert, cooperative and no distress Heart: regular rate and rhythm, S1, S2 normal, no murmur, click, rub or gallop Lungs: clear to auscultation bilaterally Abdomen: soft, non-tender; bowel sounds normal; no masses,  no organomegaly Extremities: extremities normal, atraumatic, no cyanosis or edema Wound: clean and dry  Lab Results: Recent Labs    01/26/17 0420 01/27/17 0632  WBC 9.1 8.2  HGB 13.4 13.3  HCT 41.1 40.2  PLT 128* 153   BMET:  Recent Labs    01/26/17 0420 01/27/17 0632  NA 136 136  K 4.2 4.1  CL 102 104  CO2 28 27  GLUCOSE 110* 111*  BUN 13 13  CREATININE 1.03 0.94  CALCIUM 8.0* 8.0*    PT/INR:  Recent Labs    01/27/17 0632  LABPROT 13.9  INR 1.08   ABG    Component Value Date/Time   PHART 7.374 01/25/2017 0350   HCO3 24.0 01/25/2017 0350   TCO2 20 (L) 01/25/2017 1819   ACIDBASEDEF 0.5 01/25/2017 0350   O2SAT 97.6 01/25/2017 0350   CBG (last 3)  Recent Labs    01/25/17 1943 01/25/17 2333 01/26/17 0433  GLUCAP 136* 105* 110*    Assessment/Plan: S/P  Procedure(s) (LRB): MINIMALLY INVASIVE MITRAL VALVE REPAIR (MVR) (Right) TRANSESOPHAGEAL ECHOCARDIOGRAM (TEE) (N/A)  1. CV-NSR in the 90s. BP well controlled. Taking Metoprolol 25mg  BID, ASA.  2. Pulm-tolerating room air with good saturation. CXR yesterday showed stable cardiomegaly, no pulmonary venous congestion, low lung volumes with mild basilar atelectasis. Chest tubes put out 169ml/24 hours.  3. Renal-creatinine 0.94, electrolytes okay. Good urine output with a negative fluid balance. Weight is trending down. Tolerating Lasix 40 daily.  4. H and H stable, platelets are trending up 5. Endo-blood glucose level well controlled 6. Anticoagulation-INR 1.08. Coumadin 5mg   Plan: Can remove remaining chest tubes today. Ambulate TID. Continue using incentive spirometer. Continue coumadin INR slowly climbing. Continue diuretics. Likely home this weekend.     LOS: 3 days    Elgie Collard 01/27/2017  I have seen and examined the patient and agree with the assessment and plan as outlined.  Tentatively plan D/C home tomorrow.  Will need to check INR in Coumadin clinic early next week.  Continue low dose ASA until INR therapeutic.  Rexene Alberts, MD 01/27/2017 9:16 AM

## 2017-01-27 NOTE — Discharge Summary (Signed)
Physician Discharge Summary  Patient ID: Alan Mcguire MRN: [id number] DOB/AGE: [birth year] 61 y.o.  Admit date: 01/24/2017 Discharge date: 01/28/2017  Admission Diagnoses: Patient Active Problem List   Diagnosis Date Noted  . Non-rheumatic mitral regurgitation     Discharge Diagnoses:  Principal Problem:   S/P minimally invasive mitral valve repair Active Problems:   MVP (mitral valve prolapse)   Non-rheumatic mitral regurgitation   Discharged Condition: good  HPI:  Patient is a 61 year old male with history of mitral valve prolapse and mitral regurgitation referred for surgical consultation to discuss treatment options. The patient has been remarkably healthy and physically active all of his adult life. Approximately 7 or 8 years ago he was noted to have a systolic murmur on physical exam by his primary care physician, and transthoracic echocardiogram revealed the presence of mitral valve prolapse with mitral regurgitation. He was referred to Dr. [name] whohasfollowed him intermittently ever since. Recently the patient's murmur was noted to be more prominent on physical exam and follow-up transthoracic echocardiogram performed 09/08/2016 revealed mitral valve prolapse with what was felt to be moderate to severe mitral regurgitation. Left ventricular systolic function remained normal with ejection fraction estimated 60-65%. The left ventricle was noted to be mildly dilated. The patient subsequently underwent transesophageal echocardiogram and diagnostic cardiac catheterization. TEE performed 09/21/2016 confirmed the presence of myxomatous degenerative disease of the mitral valve with bileaflet prolapse including a flail segment of the posterior leaflet and severe mitral regurgitation. Cardiac catheterization performed 10/28/2016 revealed normal coronary arteries with no significant coronary artery disease. There were normal right heart pressures with mildly elevated left ventricular  end-diastolic pressure and normal left ventricular systolic function. The patient was referred for surgical consultation.  The patient is married and lives locally in [place] with his wife who used to workon the [facility] cardiac surgery team in the operating room. They have 4 adult children. 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 He has remained fairly active physically although he admits that he does not exercise on a regular basis. He states that with real strenuous exertion such as swimming he has noticed a tendency to get some degree of exertional shortness of breath. However, this only occurs with strenuous exertion and it does not limit his activities at all. He has no exertional shortness of breath with ordinary day-to-day activities. He does not experience any chest pain or chest tightness. He reports occasional palpitations. He has not had any dizziness or syncope.  Patient returns to the office today for follow-up of mitral valve prolapse with severe symptomatic primary mitral regurgitation. He was initially seen in consultation on 11/09/2016. Since then he has remained clinically stable, although he notes that he has begun to experience a significant change in his exercise tolerance. He states that he gets short of breath with strenuous exertion. Changes are relatively mild, but nonetheless noticeable. He otherwise feels well and he is looking forward to proceeding with elective surgery in the near future.    Hospital Course:  On 01/24/2017 Alan Mcguire underwent a minimally-invasive mitral valve repair with Dr. Roxy Manns. He tolerated the procedure well and was transferred to the ICU for continued care. He was extubated in a timely manner. POD 1 he was maintaining NSR and hemodynamically stable on no drips. He was on 2L [place] for oxygen support. We began to mobilize the patient. We discontinued lines and the foley catheter. We  began a diuretic regimen for fluid overload.  We started coumadin slowly for valve thrombus prophylaxis. POD 2 he was making excellent progress. He is ambulating with limited assistance and now tolerating room air. His INR is slowly climbing. He had excellent diuresis. We discontinued his pacing wires.  POD 3 we discontinued his remaining chest tubes. His weight continued to trend down with is diuretic regimen. He has been increasing his walks each day and feeling more comfortable. He continued to use his incentive spirometer. Today, he is tolerating room air, his incisions are healing well, his pain is well controlled and he is ready for discharge home.   Consults: None  Significant Diagnostic Studies:  CLINICAL DATA:  Atelectasis.  Sore chest.  Mitral valve replacement.  EXAM: PORTABLE CHEST 1 VIEW  COMPARISON:  01/25/2017 .  FINDINGS: Left IJ sheath in stable position. Interim removal of Swan-Ganz catheter. Right chest tube in stable position. No pneumothorax . Prior cardiac valve replacement. Stable cardiomegaly. No pulmonary venous congestion. Low lung volumes with mild basilar atelectasis.  IMPRESSION: 1. Interim removal of Swan-Ganz catheter. Left IJ sheath in stable position. Right chest tube in stable position. No pneumothorax.  2. Stable cardiomegaly. No pulmonary venous congestion. Low lung volumes with mild basilar atelectasis .   Electronically Signed   By: Marcello Moores  Register   On: 01/26/2017 08:35  Treatments:  CARDIOTHORACIC SURGERY OPERATIVE NOTE  Date of Procedure:                01/24/2017  Preoperative Diagnosis:      Severe Mitral Regurgitation  Postoperative Diagnosis:    Same  Procedure:        Minimally-Invasive Mitral Valve Repair             Complex valvuloplasty including triangular resection of flail segment of posterior leaflet (P2)             Artificial Gore-tex neochord placement x4             Sorin Memo 3D Ring Annuloplasty (size  34 mm, catalog # [id number], serial # [id number])               Surgeon:        Valentina Gu. Roxy Manns, MD  Assistant:       Nicholes Rough, PA-C and Ara Kussmaul, CRNFA  Anesthesia:    Roberts Gaudy, MD  Operative Findings: ? 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 Type II mitral valve dysfunction with severe mitral regurgitation ? Dilated left ventricle with normal left ventricular systolic function ? No residual mitral regurgitation after successful valve repair    Discharge Exam: Blood pressure 122/78, pulse 92, temperature 98.5 F (36.9 C), temperature source Oral, resp. rate 15, height 5\' 11"  (1.803 m), weight 174 lb 4.8 oz (79.1 kg), SpO2 95 %.   General appearance: alert, cooperative and no distress Heart: regular rate and rhythm, S1, S2 normal, no murmur, click, rub or gallop Lungs: clear to auscultation bilaterally Abdomen: soft, non-tender; bowel sounds normal; no masses,  no organomegaly Extremities: extremities normal, atraumatic, no cyanosis or edema Wound: clean and dry    Disposition: 01-Home or Self Care  Discharge Instructions    Amb Referral to Cardiac Rehabilitation   Complete by:  As directed    Diagnosis:  Valve Repair   Valve:  Mitral   Discharge patient   Complete by:  As directed    Discharge disposition:  01-Home or Self Care   Discharge patient date:  01/28/2017  Allergies as of 01/28/2017      Reactions   Bee Venom Anaphylaxis      Medication List    STOP taking these medications   SAW PALMETTO PO     TAKE these medications   acetaminophen 500 MG tablet Commonly known as:  TYLENOL Take 2 tablets (1,000 mg total) every 6 (six) hours by mouth.   aspirin EC 81 MG tablet Take 81 mg by mouth daily.   metoprolol tartrate 25 MG tablet Commonly known as:  LOPRESSOR Take 1 tablet (25 mg total) 2 (two) times daily by mouth.   multivitamin capsule Take 1 capsule by mouth daily.    oxyCODONE 5 MG immediate release tablet Commonly known as:  Oxy IR/ROXICODONE Take 1 tablet (5 mg total) every 4 (four) hours as needed by mouth for severe pain.   warfarin 5 MG tablet Commonly known as:  COUMADIN Take 1 tablet (5 mg total) daily at 6 PM by mouth.      Follow-up Information    Dorothyann Peng, NP. Call in 1 day(s).   Specialty:  Family Medicine Contact information: [address] [place] [address] [phone]        Rexene Alberts, MD Follow up.   Specialty:  Cardiothoracic Surgery Why:  Your appointment is on 02/13/2017 at 3pm. Please arrive at 2:30pm for a chest xray located at [facility] which is on the first floor of our building.  Contact information: [place] [address] [place] [facility] Follow up.   Specialty:  Cardiology Why:  Coumadin Clinic 11/20 @2 :30pm ([facility])  Contact information: [address], [place], [place], PA-C Follow up.   Specialties:  Physician Assistant, Cardiology Why:  Bernerd Pho, PA-C 11/30 @ 10 am ([facility])  Contact information: [address] [place] [address] [phone]          The patient has been discharged on:   1.Beta Blocker:  Yes [ x  ]                              No   [   ]                              If No, reason:  2.Ace Inhibitor/ARB: Yes [   ]                                     No  [  x  ]                                     If No, reason: titration of BB  3.Statin:   Yes [   ]                  No  [ x  ]                  If No, reason: no CAD  4.Shela CommonsVelta Addison  [ x  ]  No   [   ]                  If No, reason:    Signed: Elgie Collard 01/28/2017, 9:49 AM

## 2017-01-27 NOTE — Progress Notes (Signed)
Pt ambulated unit with daughter using RW.  PRN pain meds given after walk, several visitors at bedside.  Will con't plan of care.

## 2017-01-27 NOTE — Discharge Instructions (Signed)

## 2017-01-27 NOTE — Progress Notes (Signed)
Y site CT removed from R side per order with 2 RNs per unit protocol.  Pt tolerated well.  Occlusive pressure dressing applied.  On-Q pump removed as well.  Pt appreciative with no complaints, understands bedrest for 30 mins.  Will cont plan of care.

## 2017-01-28 ENCOUNTER — Inpatient Hospital Stay (HOSPITAL_COMMUNITY): Payer: 59

## 2017-01-28 LAB — PROTIME-INR
INR: 1.15
PROTHROMBIN TIME: 14.6 s (ref 11.4–15.2)

## 2017-01-28 MED ORDER — KETOROLAC TROMETHAMINE 15 MG/ML IJ SOLN
15.0000 mg | Freq: Once | INTRAMUSCULAR | Status: AC
  Administered 2017-01-28: 15 mg via INTRAVENOUS
  Filled 2017-01-28: qty 1

## 2017-01-28 MED ORDER — WARFARIN SODIUM 5 MG PO TABS: 5.0000 mg | ORAL_TABLET | Freq: Every day | ORAL | 1 refills | Status: DC

## 2017-01-28 MED ORDER — OXYCODONE HCL 5 MG PO TABS
5.0000 mg | ORAL_TABLET | ORAL | 0 refills | Status: DC | PRN
Start: 2017-01-28 — End: 2017-02-13

## 2017-01-28 MED ORDER — ACETAMINOPHEN 500 MG PO TABS: 1000.0000 mg | ORAL_TABLET | Freq: Four times a day (QID) | ORAL | 0 refills | Status: DC

## 2017-01-28 MED ORDER — SORBITOL 70 % PO SOLN
60.0000 mL | Freq: Every day | ORAL | Status: DC | PRN
  Filled 2017-01-28: qty 60

## 2017-01-28 MED ORDER — METOPROLOL TARTRATE 25 MG PO TABS: 25.0000 mg | ORAL_TABLET | Freq: Two times a day (BID) | ORAL | 1 refills | Status: DC

## 2017-01-28 NOTE — Progress Notes (Signed)
      [address]       [place] [address]             [phone]      4 Days Post-Op Procedure(s) (LRB): MINIMALLY INVASIVE MITRAL VALVE REPAIR (MVR) (Right) TRANSESOPHAGEAL ECHOCARDIOGRAM (TEE) (N/A) Subjective: Doing well this morning.   Objective: Vital signs in last 24 hours: Temp:  [98 F (36.7 C)-98.9 F (37.2 C)] 98.5 F (36.9 C) (11/17 0540) Pulse Rate:  [89-131] 92 (11/17 0937) Cardiac Rhythm: Heart block (11/17 0741) Resp:  [12-25] 15 (11/17 0800) BP: (101-134)/(61-81) 122/78 (11/17 0937) SpO2:  [90 %-95 %] 95 % (11/17 0540) Weight:  [174 lb 4.8 oz (79.1 kg)] 174 lb 4.8 oz (79.1 kg) (11/17 0540)     Intake/Output from previous day: 11/16 0701 - 11/17 0700 In: 240 [P.O.:240] Out: 1600 [Urine:1600] Intake/Output this shift: No intake/output data recorded.  General appearance: alert, cooperative and no distress Heart: regular rate and rhythm, S1, S2 normal, no murmur, click, rub or gallop Lungs: clear to auscultation bilaterally Abdomen: soft, non-tender; bowel sounds normal; no masses,  no organomegaly Extremities: extremities normal, atraumatic, no cyanosis or edema Wound: clean and dry  Lab Results: Recent Labs    01/26/17 0420 01/27/17 0632  WBC 9.1 8.2  HGB 13.4 13.3  HCT 41.1 40.2  PLT 128* 153   BMET:  Recent Labs    01/26/17 0420 01/27/17 0632  NA 136 136  K 4.2 4.1  CL 102 104  CO2 28 27  GLUCOSE 110* 111*  BUN 13 13  CREATININE 1.03 0.94  CALCIUM 8.0* 8.0*    PT/INR:  Recent Labs    01/28/17 0413  LABPROT 14.6  INR 1.15   ABG    Component Value Date/Time   PHART 7.374 01/25/2017 0350   HCO3 24.0 01/25/2017 0350   TCO2 20 (L) 01/25/2017 1819   ACIDBASEDEF 0.5 01/25/2017 0350   O2SAT 97.6 01/25/2017 0350   CBG (last 3)  Recent Labs    01/25/17 1943 01/25/17 2333 01/26/17 0433  GLUCAP 136* 105* 110*    Assessment/Plan: S/P Procedure(s) (LRB): MINIMALLY INVASIVE MITRAL VALVE REPAIR (MVR)  (Right) TRANSESOPHAGEAL ECHOCARDIOGRAM (TEE) (N/A)  1. CV-NSR in the 90s. BP well controlled. Taking Metoprolol 25mg  BID, ASA.  2. Pulm-tolerating room air with good saturation.Chest tubes removed yesterday. CXR this morning showed removal of right chest tube with no pneumothorax. Small right pleural effusion and right base atelectasis.  3. Renal-creatinine 0.94, electrolytes okay. Good urine output with a negative fluid balance. Weight is trending down. Tolerating Lasix 40 daily.  4. H and H stable, platelets are trending up 5. Endo-blood glucose level well controlled 6. Anticoagulation-INR 1.15. Coumadin 5mg   Plan: Discharge today.    LOS: 4 days    Elgie Collard 01/28/2017

## 2017-01-28 NOTE — Progress Notes (Signed)
Discharge instructions given to patient and wife.  Voiced understanding and questions answered,    Influenza shot  Given this admission.  IV site and Telemetry discontinued  Mervyn Skeeters, RN

## 2017-01-30 ENCOUNTER — Telehealth (HOSPITAL_COMMUNITY): Payer: Self-pay

## 2017-01-30 NOTE — Telephone Encounter (Signed)
Patients insurance is active and benefits verified through [facility] - No co-pay, deductible amount of $1,000/$1,000 has been met, out of pocket amount of $4,000/$4,000 has been met, no co-insurance, and no pre-authorization is required. Passport/reference [id number]  Patient will be contacted and scheduled after their follow up visit with the Cardiologist office upon review by the RN Navigator.

## 2017-01-31 ENCOUNTER — Ambulatory Visit (INDEPENDENT_AMBULATORY_CARE_PROVIDER_SITE_OTHER): Payer: 59 | Admitting: *Deleted

## 2017-01-31 DIAGNOSIS — Z5181 Encounter for therapeutic drug level monitoring: Secondary | ICD-10-CM

## 2017-01-31 DIAGNOSIS — I341 Nonrheumatic mitral (valve) prolapse: Secondary | ICD-10-CM

## 2017-01-31 DIAGNOSIS — Z9889 Other specified postprocedural states: Secondary | ICD-10-CM | POA: Diagnosis not present

## 2017-01-31 LAB — POCT INR: INR: 2

## 2017-01-31 NOTE — Patient Instructions (Addendum)
A full discussion of the nature of anticoagulants has been carried out.  A benefit risk analysis has been presented to the patient, so that they understand the justification for choosing anticoagulation at this time. The need for frequent and regular monitoring, precise dosage adjustment and compliance is stressed.  Side effects of potential bleeding are discussed.  The patient should avoid any OTC items containing aspirin or ibuprofen, and should avoid great swings in general diet.  Avoid alcohol consumption.  Call if any signs of abnormal bleeding.  Continue taking 1 tablet (5mg ) daily Recheck INR in 1 week Call with any new medications any new scheduled procedures or with any questions or concerns [phone]

## 2017-02-03 ENCOUNTER — Telehealth: Payer: Self-pay | Admitting: Physician Assistant

## 2017-02-03 NOTE — Telephone Encounter (Signed)
Mr. Pargas called requesting a refill on his pain medication.  He states that he does not have enough to get through the weekend.  He was discharged home on 01/28/2017 after having a Minimally Invasive Mitral Valve repair.  He states that his pain was quite severe at time of discharge.  He was taking 2 tablets every 4 hours.  He states he has weaned himself down since then and is currently taking 1 tablet every 4 hours for pain.     I explained to the patient that he needs to only take medication as he is prescribed.  He was given a prescription for Oxy IR 5 mg tablet (1 tab) every 4 hours.  This is only to be used for severe pain.  I explained that I will provide him a refill on his pain medication, but he will need to continue to decrease his usage.  He will be given a prescription for 1 tablet every 6-8 hours , disp #30 with no refills.  Our office is closed.  This prescription is at the [facility] main desk for the patient to pick up.   Ellwood Handler, PA-C

## 2017-02-08 ENCOUNTER — Ambulatory Visit (INDEPENDENT_AMBULATORY_CARE_PROVIDER_SITE_OTHER): Payer: 59 | Admitting: Pharmacist

## 2017-02-08 DIAGNOSIS — Z9889 Other specified postprocedural states: Secondary | ICD-10-CM

## 2017-02-08 DIAGNOSIS — Z5181 Encounter for therapeutic drug level monitoring: Secondary | ICD-10-CM | POA: Diagnosis not present

## 2017-02-08 LAB — POCT INR: INR: 3.7

## 2017-02-08 NOTE — Patient Instructions (Signed)
Hold your Coumadin tonight then start taking 1 tablet (5mg ) daily except 1/2 tablet (2.5mg ) on Mondays and Thursdays. Recheck INR in 1 week. Call with any new medications any new scheduled procedures or with any questions or concerns [phone]

## 2017-02-09 NOTE — Progress Notes (Signed)
Cardiology Office Note    Date:  02/10/2017   ID:  Kaelon, Weekes 1955/12/17, MRN [id number]  PCP:  Dorothyann Peng, NP  Cardiologist: Dr. [name]  Chief Complaint  Patient presents with  . Hospitalization Follow-up    s/p mitral valve repair    History of Present Illness:    Alan Mcguire is a 61 y.o. male with past medical history of MVP (with severe MR by recent echocardiogram), normal cors by cath in 09/2016, and chronic back pain who presents to the office today for hospital follow-up.   He was recently admitted to [facility] from 11/13 - 01/28/2017 for mitral valve repair. His mitral valve prolpase has been followed by Dr. [name] and he had been referred to Dr. Roxy Manns for surgical repair. Options were reviewed and he underwent minimally invasive mitral valve repair on 01/24/2017 with complex valvuloplasty including triangular resection of the flail segment of the posterior leaflet. His surgical course progressed appropriately and he was diuresed following the procedure. He maintained NSR throughout admission. Hgb was stable at 13.3 at the time of discharge.   In talking with the patient today, he reports overall doing well since his recent hospitalization. He was initially experiencing significant chest pain along his suture line but reports this has improved and he is now only taking Tylenol as needed. Denies any recent dyspnea on exertion, orthopnea, PND, or lower extremity edema. He has been sleeping in a recliner due to difficulty getting out of his bed. He has not been checking blood pressure regularly at home but it is well controlled at 131/75 during today's visit.  He has noted significant fatigue and feels like this may be be due to some of his medications as Lopressor was new at the time of hospital discharge.   He reports good compliance with Coumadin and denies any evidence of active bleeding with this. Most recent INR on 11/28 was elevated to 3.7, therefore  his Coumadin dosing was appropriately adjusted. He has already stopped taking ASA 81mg  daily.   Past Medical History:  Diagnosis Date  . Chronic lower back pain   . Lumbar disc disease    L4-5  . Murmur 9/92011   echo-MVP posterior leaflet with moderate mitral insufficiency and the jet directed anteriorly,left atrium mildly enlarged  . MVP (mitral valve prolapse)   . Non-rheumatic mitral regurgitation   . Pneumonia 1990s   hx walking pneumonia  . 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   Complex valvuloplasty including triangular resection of flail segment of posterior leaflet, artificial Gore-tex neochord placement x4 and 34 mm Sorin Memo 3D ring annuloplasty via right mini thoracotomy approach    Past Surgical History:  Procedure Laterality Date  . CARDIAC CATHETERIZATION    . COLONOSCOPY    . MITRAL VALVE REPAIR Right 01/24/2017   Procedure: MINIMALLY INVASIVE MITRAL VALVE REPAIR (MVR);  Surgeon: Rexene Alberts, MD;  Location: [facility];  Service: Open Heart Surgery;  Laterality: Right;  . RIGHT/LEFT HEART CATH AND CORONARY ANGIOGRAPHY N/A 10/28/2016   Procedure: Right/Left Heart Cath and Coronary Angiography;  Surgeon: [name], [name], MD;  Location: [facility];  Service: Cardiovascular;  Laterality: N/A;  . TEE WITHOUT CARDIOVERSION N/A 09/21/2016   Procedure: TRANSESOPHAGEAL ECHOCARDIOGRAM (TEE);  Surgeon: Skeet Latch, MD;  Location: [facility];  Service: Cardiovascular;  Laterality: N/A;  . TEE WITHOUT CARDIOVERSION N/A 01/24/2017   Procedure: TRANSESOPHAGEAL ECHOCARDIOGRAM (TEE);  Surgeon: Rexene Alberts, MD;  Location: [facility];  Service: Open Heart Surgery;  Laterality: N/A;  . VASECTOMY    . WISDOM TOOTH EXTRACTION      Current Medications: Outpatient Medications Prior to Visit  Medication Sig Dispense Refill  . acetaminophen (TYLENOL) 500 MG tablet Take 2 tablets (1,000 mg total) every 6 (six) hours by mouth. 30 tablet 0  . Multiple Vitamin  (MULTIVITAMIN) capsule Take 1 capsule by mouth daily.    Marland Kitchen oxyCODONE (OXY IR/ROXICODONE) 5 MG immediate release tablet Take 1 tablet (5 mg total) every 4 (four) hours as needed by mouth for severe pain. 70 tablet 0  . warfarin (COUMADIN) 5 MG tablet Take 1 tablet (5 mg total) daily at 6 PM by mouth. 30 tablet 1  . aspirin EC 81 MG tablet Take 81 mg by mouth daily.    . metoprolol tartrate (LOPRESSOR) 25 MG tablet Take 1 tablet (25 mg total) 2 (two) times daily by mouth. 60 tablet 1   No facility-administered medications prior to visit.      Allergies:   Bee venom   Social History   Socioeconomic History  . Marital status: Married    Spouse name: None  . Number of children: None  . Years of education: None  . Highest education level: None  Social Needs  . Financial resource strain: None  . Food insecurity - worry: None  . Food insecurity - inability: None  . Transportation needs - medical: None  . Transportation needs - non-medical: None  Occupational History  . None  Tobacco Use  . Smoking status: Never Smoker  . Smokeless tobacco: Never Used  Substance and Sexual Activity  . Alcohol use: No  . Drug use: No  . Sexual activity: None  Other Topics Concern  . None  Social History Narrative   [employer] engineer company    Married    4 kids ( 2 in college and 2 married)    He likes to play golf and work in the yard.    He coaches swimming     Family History:  The patient's family history includes Hypertension in his mother; Kidney disease in his father; Kidney failure in his father; Lung cancer in his mother.   Review of Systems:   Please see the history of present illness.     General:  No chills, fever, night sweats or weight changes. Positive for fatigue.  Cardiovascular:  No chest pain, dyspnea on exertion, edema, orthopnea, palpitations, paroxysmal nocturnal dyspnea. Dermatological: No rash, lesions/masses Respiratory: No cough, dyspnea Urologic: No  hematuria, dysuria Abdominal:   No nausea, vomiting, diarrhea, bright red blood per rectum, melena, or hematemesis Neurologic:  No visual changes, wkns, changes in mental status. All other systems reviewed and are otherwise negative except as noted above.   Physical Exam:    VS:  BP 131/75   Pulse 87   Ht 5\' 11"  (1.803 m)   Wt 173 lb 3.2 oz (78.6 kg)   BMI 24.16 kg/m    General: Well developed, well nourished Caucasian male appearing in no acute distress. Head: Normocephalic, atraumatic, sclera non-icteric, no xanthomas, nares are without discharge.  Neck: No carotid bruits. JVD not elevated.  Lungs: Respirations regular and unlabored, without wheezes or rales.  Heart: Regular rate and rhythm. No S3 or S4.  No murmur, no rubs, or gallops appreciated. Suture lines appear well-healing without erythema or drainage.  Abdomen: Soft, non-tender, non-distended with normoactive bowel sounds. No hepatomegaly. No rebound/guarding. No obvious abdominal masses. Msk:  Strength and  tone appear normal for age. No joint deformities or effusions. Extremities: No clubbing or cyanosis. No lower extremity edema.  Distal pedal pulses are 2+ bilaterally. Neuro: Alert and oriented X 3. Moves all extremities spontaneously. No focal deficits noted. Psych:  Responds to questions appropriately with a normal affect. Skin: No rashes or lesions noted  Wt Readings from Last 3 Encounters:  02/10/17 173 lb 3.2 oz (78.6 kg)  01/28/17 174 lb 4.8 oz (79.1 kg)  01/23/17 180 lb (81.6 kg)     Studies/Labs Reviewed:   EKG:  EKG is not ordered today.    Recent Labs: 01/23/2017: ALT 24 01/25/2017: Magnesium 2.3 01/27/2017: BUN 13; Creatinine, Ser 0.94; Hemoglobin 13.3; Platelets 153; Potassium 4.1; Sodium 136   Lipid Panel    Component Value Date/Time   CHOL 244 (H) 10/27/2015 0831   TRIG 95.0 10/27/2015 0831   TRIG 50 01/25/2006 1004   HDL 67.30 10/27/2015 0831   CHOLHDL 4 10/27/2015 0831   VLDL 19.0  10/27/2015 0831   LDLCALC 158 (H) 10/27/2015 0831   LDLDIRECT 123.4 06/20/2011 1010    Additional studies/ records that were reviewed today include:   TEE: 01/24/2017  Mitral valve: The anterior and posterior mitral leaflets were moderately thickened There was severe mitral insufficiency. There was a flail leaflet segment involving the posterior leaflet in the P2 region. This resulted in a jet of mitral insufficiency which was anteriorly directed along the undersurface of the anterior leaflet and the anterior wall of the left atrium with coanda effect. There was severely blunted systolic flow in the right and left upper pulmonary veins. In the post-bypas  Right ventricle: Normal cavity size, wall thickness and ejection fraction. The right ventricular cavity was normal in size. There was normal appearing right ventricle are systolic function. There was normal RV wall thickness. In the immediate post-bypass period, there was reduced right ventricular systolic function which improved over the next 30-45 minutes. Upon completion of the procedure, the RV systolic function appeared to be at baseline.  Septum: No Patent Foramen Ovale present. The interatrial septum bows to the right consistent with high left atrial pressure.  Left atrium: Patent foramen ovale not present.  Left atrium: Cavity is moderately dilated. Systolic blunting of pulmonary venous inflow. No spontaneous echo contrast.  Tricuspid valve: Mild regurgitation. The tricuspid valve regurgitation jet is central.  Right atrium: Cavity is mildly to moderately dilated. Interatrial septum bows to the right.  Assessment:    1. Non-rheumatic mitral regurgitation   2. S/P mitral valve repair   3. Fatigue, unspecified type      Plan:   In order of problems listed above:  1. Mitral Regurgitation - he has been followed for MVP, found to have severe MR by his most recent echocardiogram. Referred to CT Surgery and underwent minimally  invasive mitral valve repair on 01/24/2017 with complex valvuloplasty including triangular resection of the flail segment of the posterior leaflet. Overall hospital course was uncomplicated. He maintained NSR throughout.  - he reports overall progressing well since his recent hospitalization. His pain has significantly improved. Will continue on Coumadin for anticoagulation. Has follow-up with Dr. Roxy Manns scheduled for next week.   2. Fatigue - reports having very little energy which is difficult to discern if this is due to just his usual post-operative course or worsened by BB therapy. Hgb recently checked and stable at 13.3. - will reduce Lopressor from 25mg  BID to 12.5mg  daily to see is this helps with his symptoms. Encouraged the  patient to monitor his BP at home with the dose adjustment.    Medication Adjustments/Labs and Tests Ordered: Current medicines are reviewed at length with the patient today.  Concerns regarding medicines are outlined above.  Medication changes, Labs and Tests ordered today are listed in the Patient Instructions below. Patient Instructions  Bernerd Pho, PA-C has recommended making the following medication changes: 1. DECREASE Metoprolol to 0.5 tablet (12.5 mg total) twice daily  Your physician has requested that you have an echocardiogram in 3 months. Echocardiography is a painless test that uses sound waves to create images of your heart. It provides your doctor with information about the size and shape of your heart and how well your heart's chambers and valves are working. This procedure takes approximately one hour. There are no restrictions for this procedure. >>This will be performed at our [facility] location - [address], [place] recommends that you schedule a follow-up appointment in 3 months with Dr [name].   If you need a refill on your cardiac medications before your next appointment, please call your pharmacy.    Signed, Erma Heritage, PA-C  02/10/2017 12:25 PM    [facility] [place], [place], [place]  [address] Phone: [phone]; Fax: [phone]  [address], [place], [place] [address] Phone: [phone]

## 2017-02-10 ENCOUNTER — Ambulatory Visit: Payer: 59 | Admitting: Student

## 2017-02-10 ENCOUNTER — Other Ambulatory Visit: Payer: Self-pay | Admitting: Thoracic Surgery (Cardiothoracic Vascular Surgery)

## 2017-02-10 ENCOUNTER — Encounter: Payer: Self-pay | Admitting: Student

## 2017-02-10 VITALS — BP 131/75 | HR 87 | Ht 71.0 in | Wt 173.2 lb

## 2017-02-10 DIAGNOSIS — R5383 Other fatigue: Secondary | ICD-10-CM

## 2017-02-10 DIAGNOSIS — I340 Nonrheumatic mitral (valve) insufficiency: Secondary | ICD-10-CM

## 2017-02-10 DIAGNOSIS — Z9889 Other specified postprocedural states: Secondary | ICD-10-CM | POA: Diagnosis not present

## 2017-02-10 MED ORDER — METOPROLOL TARTRATE 25 MG PO TABS: 12.5000 mg | ORAL_TABLET | Freq: Two times a day (BID) | ORAL | 3 refills | Status: DC

## 2017-02-10 NOTE — Patient Instructions (Signed)
Bernerd Pho, PA-C has recommended making the following medication changes: 1. DECREASE Metoprolol to 0.5 tablet (12.5 mg total) twice daily  Your physician has requested that you have an echocardiogram in 3 months. Echocardiography is a painless test that uses sound waves to create images of your heart. It provides your doctor with information about the size and shape of your heart and how well your heart's chambers and valves are working. This procedure takes approximately one hour. There are no restrictions for this procedure. >>This will be performed at our [facility] location - [address] [place] recommends that you schedule a follow-up appointment in 3 months with Dr [name].   If you need a refill on your cardiac medications before your next appointment, please call your pharmacy.

## 2017-02-13 ENCOUNTER — Other Ambulatory Visit: Payer: Self-pay

## 2017-02-13 ENCOUNTER — Telehealth (HOSPITAL_COMMUNITY): Payer: Self-pay

## 2017-02-13 ENCOUNTER — Ambulatory Visit (INDEPENDENT_AMBULATORY_CARE_PROVIDER_SITE_OTHER): Payer: Self-pay | Admitting: Physician Assistant

## 2017-02-13 ENCOUNTER — Ambulatory Visit
Admission: RE | Admit: 2017-02-13 | Discharge: 2017-02-13 | Disposition: A | Discharge status: Home / Self Care | Payer: 59 | Source: Ambulatory Visit | Attending: Thoracic Surgery (Cardiothoracic Vascular Surgery) | Admitting: Thoracic Surgery (Cardiothoracic Vascular Surgery)

## 2017-02-13 VITALS — BP 112/77 | HR 85 | Ht 71.0 in | Wt 173.0 lb

## 2017-02-13 DIAGNOSIS — Z9889 Other specified postprocedural states: Secondary | ICD-10-CM

## 2017-02-13 DIAGNOSIS — I340 Nonrheumatic mitral (valve) insufficiency: Secondary | ICD-10-CM

## 2017-02-13 MED ORDER — METOPROLOL SUCCINATE ER 25 MG PO TB24: 25.0000 mg | ORAL_TABLET | Freq: Every day | ORAL | 3 refills | Status: DC

## 2017-02-13 NOTE — Telephone Encounter (Signed)
Attempted to call patient in regards to Cardiac Rehab - Lm on Vm °

## 2017-02-13 NOTE — Progress Notes (Signed)
HPI: Patient returns for routine postoperative follow-up having undergone Mini MV Repair on 01/24/2017.  The patient's early postoperative recovery while in the hospital was unremarkable.  Since hospital discharge the patient reports he is doing better.  He states that he was able to stop taking pain medication last week.  He does still have some numbness and pins and needles type feeling along his incision site.  He is ambulating as much as he can weather permitting.  His diet has returned to normal.  He does have issues with fatigue at times, but states overall he thinks it is improving, especially after decrease of his Lopressor dose.  He does ask if there is a once a day tablet available.  Current Outpatient Medications  Medication Sig Dispense Refill  . acetaminophen (TYLENOL) 500 MG tablet Take 2 tablets (1,000 mg total) every 6 (six) hours by mouth. 30 tablet 0  . warfarin (COUMADIN) 5 MG tablet Take 1 tablet (5 mg total) daily at 6 PM by mouth. 30 tablet 1  . metoprolol succinate (TOPROL XL) 25 MG 24 hr tablet Take 1 tablet (25 mg total) by mouth daily. 30 tablet 3  . Multiple Vitamin (MULTIVITAMIN) capsule Take 1 capsule by mouth daily.     No current facility-administered medications for this visit.     Physical Exam:  BP 112/77   Pulse 85   Ht 5\' 11"  (1.803 m)   Wt 173 lb (78.5 kg)   SpO2 98%   BMI 24.13 kg/m   Gen:  No acute issues Heart: RRR: Lung CTA bilaterally Abd; soft non-tender non-distended Ext: no edemea Incisions: well healed, chest tube sutures in place  Diagnostic Tests:  CXR: small bilateral effusions, no pneumothorax present  A/P:  1. S/P Mini MVR- doing well, remains on coumadin and will require 3 months of treatment 2. CV- NSR, hemodynamically stable, BP controlled... Provided script for Toprol XL 25 mg daily 3. Activity- increase as tolerated, continue ambulating as weather allows, may resume driving 4. RTC in 3 months with Dr. Geraldo Docker, PA-C [facility] [phone]

## 2017-02-14 ENCOUNTER — Encounter (HOSPITAL_COMMUNITY): Payer: Self-pay | Admitting: *Deleted

## 2017-02-15 ENCOUNTER — Ambulatory Visit (INDEPENDENT_AMBULATORY_CARE_PROVIDER_SITE_OTHER): Payer: 59 | Admitting: *Deleted

## 2017-02-15 ENCOUNTER — Telehealth (HOSPITAL_COMMUNITY): Payer: Self-pay

## 2017-02-15 DIAGNOSIS — Z5181 Encounter for therapeutic drug level monitoring: Secondary | ICD-10-CM

## 2017-02-15 DIAGNOSIS — Z9889 Other specified postprocedural states: Secondary | ICD-10-CM

## 2017-02-15 LAB — POCT INR: INR: 2.7

## 2017-02-15 NOTE — Telephone Encounter (Signed)
Patient returned phone call in regards to Cardiac Rehab - Scheduled orientation on 03/09/2017 at 8:30am. Patient will attend the 2:45pm exc class.

## 2017-02-15 NOTE — Patient Instructions (Signed)
Continue  taking 1 tablet (5mg ) daily except 1/2 tablet (2.5mg ) on Mondays and Thursdays. Recheck INR in 1 week. Call with any new medications any new scheduled procedures or with any questions or concerns [phone]

## 2017-02-22 ENCOUNTER — Ambulatory Visit (INDEPENDENT_AMBULATORY_CARE_PROVIDER_SITE_OTHER): Payer: 59 | Admitting: *Deleted

## 2017-02-22 DIAGNOSIS — I340 Nonrheumatic mitral (valve) insufficiency: Secondary | ICD-10-CM | POA: Diagnosis not present

## 2017-02-22 DIAGNOSIS — Z5181 Encounter for therapeutic drug level monitoring: Secondary | ICD-10-CM

## 2017-02-22 DIAGNOSIS — Z9889 Other specified postprocedural states: Secondary | ICD-10-CM

## 2017-02-22 DIAGNOSIS — I341 Nonrheumatic mitral (valve) prolapse: Secondary | ICD-10-CM

## 2017-02-22 LAB — POCT INR: INR: 3.1

## 2017-02-22 NOTE — Patient Instructions (Signed)
Today Dec 12th take only 1/2 tablet (2.5mg ) then continue  taking 1 tablet (5mg ) daily except 1/2 tablet (2.5mg ) on Mondays and Thursdays. Recheck INR in 2 weeks. Call with any new medications any new scheduled procedures or with any questions or concerns [phone]

## 2017-03-03 ENCOUNTER — Telehealth: Payer: Self-pay

## 2017-03-03 NOTE — Telephone Encounter (Signed)
Unable to reach patient by phone.Left message on wife's cell phone husband will need antibiotics before dental work.Advised to call me back.

## 2017-03-08 ENCOUNTER — Telehealth (HOSPITAL_COMMUNITY): Payer: Self-pay

## 2017-03-08 ENCOUNTER — Ambulatory Visit (INDEPENDENT_AMBULATORY_CARE_PROVIDER_SITE_OTHER): Payer: 59 | Admitting: *Deleted

## 2017-03-08 DIAGNOSIS — Z5181 Encounter for therapeutic drug level monitoring: Secondary | ICD-10-CM | POA: Diagnosis not present

## 2017-03-08 DIAGNOSIS — Z9889 Other specified postprocedural states: Secondary | ICD-10-CM | POA: Diagnosis not present

## 2017-03-08 LAB — POCT INR: INR: 2.4

## 2017-03-08 NOTE — Patient Instructions (Signed)
Description   Continue  taking 1 tablet (5mg ) daily except 1/2 tablet (2.5mg ) on Mondays and Thursdays. Recheck INR in 2 weeks. Call with any new medications any new scheduled procedures or with any questions or concerns [phone]

## 2017-03-09 ENCOUNTER — Encounter (HOSPITAL_COMMUNITY)
Admission: RE | Admit: 2017-03-09 | Discharge: 2017-03-09 | Disposition: A | Discharge status: Home / Self Care | Payer: 59 | Source: Ambulatory Visit | Attending: Cardiology | Admitting: Cardiology

## 2017-03-09 ENCOUNTER — Encounter (HOSPITAL_COMMUNITY): Payer: Self-pay

## 2017-03-09 VITALS — Ht 71.0 in | Wt 175.7 lb

## 2017-03-09 DIAGNOSIS — Z9889 Other specified postprocedural states: Secondary | ICD-10-CM | POA: Diagnosis not present

## 2017-03-09 NOTE — Progress Notes (Signed)
Alan REIERSON 61 y.o. male DOB: 08/21/1955 MRN: [id number]      Nutrition Note  1. S/P mitral valve repair    Past Medical History:  Diagnosis Date  . Chronic lower back pain   . Lumbar disc disease    L4-5  . Murmur 9/92011   echo-MVP posterior leaflet with moderate mitral insufficiency and the jet directed anteriorly,left atrium mildly enlarged  . MVP (mitral valve prolapse)   . Non-rheumatic mitral regurgitation   . Pneumonia 1990s   hx walking pneumonia  . 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   Complex valvuloplasty including triangular resection of flail segment of posterior leaflet, artificial Gore-tex neochord placement x4 and 34 mm Sorin Memo 3D ring annuloplasty via right mini thoracotomy approach   Meds reviewed. Coumadin noted  HT: Ht Readings from Last 1 Encounters:  03/09/17 5\' 11"  (1.803 m)    WT: Wt Readings from Last 3 Encounters:  03/09/17 175 lb 11.3 oz (79.7 kg)  02/13/17 173 lb (78.5 kg)  02/10/17 173 lb 3.2 oz (78.6 kg)     BMI 24.5   Current tobacco use? No  Labs:  Lipid Panel     Component Value Date/Time   CHOL 244 (H) 10/27/2015 0831   TRIG 95.0 10/27/2015 0831   TRIG 50 01/25/2006 1004   HDL 67.30 10/27/2015 0831   CHOLHDL 4 10/27/2015 0831   VLDL 19.0 10/27/2015 0831   LDLCALC 158 (H) 10/27/2015 0831   LDLDIRECT 123.4 06/20/2011 1010    Lab Results  Component Value Date   HGBA1C 5.3 01/23/2017   CBG (last 3)  No results for input(s): GLUCAP in the last 72 hours.  Nutrition Note Spoke with pt. Nutrition plan and goals reviewed with pt. Pt is following Step 1 of the Therapeutic Lifestyle Changes diet. Pt is on Coumadin. Per discussion, pt is aware of the need to follow a diet with consistent vitamin K intake. Pt expressed understanding of the information reviewed. Pt aware of nutrition education classes offered.  Nutrition Diagnosis ? Food-and nutrition-related knowledge deficit related to lack of exposure to  information as related to diagnosis of: ? MVR  Nutrition Intervention ? Pt's individual nutrition plan and goals reviewed with pt. ? Pt given handouts for: ? Nutrition I class ? Nutrition II class   Nutrition Goal(s): Pt does not have any specific nutrition goals he wants to focus on while in rehab. ? Pt to identify and limit food sources of saturated fat, trans fat, and sodium  Plan:  Pt to attend nutrition classes ? Nutrition I ? Nutrition II ? Portion Distortion  Will provide client-centered nutrition education as part of interdisciplinary care.   Monitor and evaluate progress toward nutrition goal with team.  Derek Mound, M.Ed, RD, LDN, CDE 03/09/2017 2:59 PM

## 2017-03-09 NOTE — Progress Notes (Signed)
Cardiac Rehab Medication Review  Does the patient  feel that his/her medications are working for him/her?  YES   Has the patient been experiencing any side effects to the medications prescribed?  NO  Does the patient measure his/her own blood pressure or blood glucose at home?  YES  Does the patient have any problems obtaining medications due to transportation or finances?   No  Understanding of regimen: excellent Understanding of indications: excellent Potential of compliance: excellent    comments: Pt wife is a nurse and feels comfortable with his medication routine. Pt not taking on regularly take MVI and no longer needs the Tylenol.     Celes Dedic Ruben Im RN 03/09/2017 8:59 AM

## 2017-03-09 NOTE — Progress Notes (Signed)
Cardiac Individual Treatment Plan  Patient Details  Name: Alan Mcguire MRN: [id number] Date of Birth: [birth year] Referring Provider:     CARDIAC REHAB PHASE II ORIENTATION from 03/09/2017 in [place]  Referring Provider  [name], [name], MD      Initial Encounter Date:    CARDIAC REHAB PHASE II ORIENTATION from 03/09/2017 in [place]  Date  03/09/17  Referring Provider  [name], [name], MD      Visit Diagnosis: S/P mitral valve repair  Patient's Home Medications on Admission:  Current Outpatient Medications:  .  metoprolol succinate (TOPROL XL) 25 MG 24 hr tablet, Take 1 tablet (25 mg total) by mouth daily., Disp: 30 tablet, Rfl: 3 .  warfarin (COUMADIN) 5 MG tablet, Take 1 tablet (5 mg total) daily at 6 PM by mouth., Disp: 30 tablet, Rfl: 1 .  acetaminophen (TYLENOL) 500 MG tablet, Take 2 tablets (1,000 mg total) every 6 (six) hours by mouth. (Patient not taking: Reported on 03/09/2017), Disp: 30 tablet, Rfl: 0 .  Multiple Vitamin (MULTIVITAMIN) capsule, Take 1 capsule by mouth daily., Disp: , Rfl:   Past Medical History: Past Medical History:  Diagnosis Date  . Chronic lower back pain   . Lumbar disc disease    L4-5  . Murmur 9/92011   echo-MVP posterior leaflet with moderate mitral insufficiency and the jet directed anteriorly,left atrium mildly enlarged  . MVP (mitral valve prolapse)   . Non-rheumatic mitral regurgitation   . Pneumonia 1990s   hx walking pneumonia  . 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   Complex valvuloplasty including triangular resection of flail segment of posterior leaflet, artificial Gore-tex neochord placement x4 and 34 mm Sorin Memo 3D ring annuloplasty via right mini thoracotomy approach    Tobacco Use: Social History   Tobacco Use  Smoking Status Never Smoker  Smokeless Tobacco Never Used    Labs: Recent Review Flowsheet Data    Labs for ITP  Cardiac and Pulmonary Rehab Latest Ref Rng & Units 01/24/2017 01/24/2017 01/24/2017 01/24/2017 01/25/2017   Cholestrol 0 - 200 mg/dL - - - - -   LDLCALC 0 - 99 mg/dL - - - - -   LDLDIRECT mg/dL - - - - -   HDL >39.00 mg/dL - - - - -   Trlycerides 0.0 - 149.0 mg/dL - - - - -   Hemoglobin A1c 4.8 - 5.6 % - - - - -   PHART 7.350 - 7.450 7.284(L) 7.383 7.328(L) - 7.374   PCO2ART 32.0 - 48.0 mmHg 49.7(H) 41.8 45.4 - 42.1   HCO3 20.0 - 28.0 mmol/L 24.1 25.0 23.9 - 24.0   TCO2 22 - 32 mmol/L 26 26 25 24  20(L)   ACIDBASEDEF 0.0 - 2.0 mmol/L 4.0(H) - 2.0 - 0.5   O2SAT % 96.0 99.0 98.0 - 97.6      Capillary Blood Glucose: Lab Results  Component Value Date   GLUCAP 110 (H) 01/26/2017   GLUCAP 105 (H) 01/25/2017   GLUCAP 136 (H) 01/25/2017   GLUCAP 146 (H) 01/25/2017   GLUCAP 110 (H) 01/25/2017     Exercise Target Goals: Date: 03/09/17  Exercise Program Goal: Individual exercise prescription set with THRR, safety & activity barriers. Participant demonstrates ability to understand and report RPE using BORG scale, to self-measure pulse accurately, and to acknowledge the importance of the exercise prescription.  Exercise Prescription Goal: Starting with aerobic activity 30 plus minutes a day, 3  days per week for initial exercise prescription. Provide home exercise prescription and guidelines that participant acknowledges understanding prior to discharge.  Activity Barriers & Risk Stratification: Activity Barriers & Cardiac Risk Stratification - 03/09/17 1125      Activity Barriers & Cardiac Risk Stratification   Activity Barriers  Incisional Pain    Cardiac Risk Stratification  High       6 Minute Walk: 6 Minute Walk    Row Name 03/09/17 1105         6 Minute Walk   Phase  Initial     Distance  1929 feet     Walk Time  6 minutes     # of Rest Breaks  0     MPH  3.65     METS  4.82     RPE  11     Perceived Dyspnea   0     VO2 Peak  16.85     Symptoms  No     Resting HR   91 bpm     Resting BP  102/70     Resting Oxygen Saturation   98 %     Exercise Oxygen Saturation  during 6 min walk  98 %     Max Ex. HR  109 bpm     Max Ex. BP  122/80     2 Minute Post BP  104/82        Oxygen Initial Assessment:   Oxygen Re-Evaluation:   Oxygen Discharge (Final Oxygen Re-Evaluation):   Initial Exercise Prescription: Initial Exercise Prescription - 03/09/17 1100      Date of Initial Exercise RX and Referring Provider   Date  03/09/17    Referring Provider  [name], [name], MD      Treadmill   MPH  3.3    Grade  1    Minutes  10    METs  3.98      Bike   Level  1.3    Minutes  10    METs  4.07      NuStep   Level  3    SPM  70    Minutes  10    METs  3.5      Prescription Details   Frequency (times per week)  3x    Duration  Progress to 30 minutes of continuous aerobic without signs/symptoms of physical distress      Intensity   THRR 40-80% of Max Heartrate  64-128    Ratings of Perceived Exertion  11-13    Perceived Dyspnea  0-4      Progression   Progression  Continue progressive overload as per policy without signs/symptoms or physical distress.      Resistance Training   Training Prescription  Yes    Weight  2lbs    Reps  10-15       Perform Capillary Blood Glucose checks as needed.  Exercise Prescription Changes:   Exercise Comments:   Exercise Goals and Review: Exercise Goals    Row Name 03/09/17 1104             Exercise Goals   Increase Physical Activity  Yes       Intervention  Provide advice, education, support and counseling about physical activity/exercise needs.;Develop an individualized exercise prescription for aerobic and resistive training based on initial evaluation findings, risk stratification, comorbidities and participant's personal goals.       Expected Outcomes  Achievement of increased cardiorespiratory  fitness and enhanced flexibility, muscular endurance and strength shown through  measurements of functional capacity and personal statement of participant.       Increase Strength and Stamina  Yes       Intervention  Provide advice, education, support and counseling about physical activity/exercise needs.;Develop an individualized exercise prescription for aerobic and resistive training based on initial evaluation findings, risk stratification, comorbidities and participant's personal goals.       Expected Outcomes  Achievement of increased cardiorespiratory fitness and enhanced flexibility, muscular endurance and strength shown through measurements of functional capacity and personal statement of participant.       Able to understand and use rate of perceived exertion (RPE) scale  Yes       Intervention  Provide education and explanation on how to use RPE scale       Expected Outcomes  Short Term: Able to use RPE daily in rehab to express subjective intensity level;Long Term:  Able to use RPE to guide intensity level when exercising independently       Knowledge and understanding of Target Heart Rate Range (THRR)  Yes       Intervention  Provide education and explanation of THRR including how the numbers were predicted and where they are located for reference       Expected Outcomes  Short Term: Able to use daily as guideline for intensity in rehab;Long Term: Able to use THRR to govern intensity when exercising independently;Short Term: Able to state/look up THRR       Able to check pulse independently  Yes       Intervention  Provide education and demonstration on how to check pulse in carotid and radial arteries.;Review the importance of being able to check your own pulse for safety during independent exercise       Expected Outcomes  Short Term: Able to explain why pulse checking is important during independent exercise;Long Term: Able to check pulse independently and accurately       Understanding of Exercise Prescription  Yes       Intervention  Provide education,  explanation, and written materials on patient's individual exercise prescription       Expected Outcomes  Short Term: Able to explain program exercise prescription;Long Term: Able to explain home exercise prescription to exercise independently          Exercise Goals Re-Evaluation :    Discharge Exercise Prescription (Final Exercise Prescription Changes):   Nutrition:  Target Goals: Understanding of nutrition guidelines, daily intake of sodium 1500mg , cholesterol 200mg , calories 30% from fat and 7% or less from saturated fats, daily to have 5 or more servings of fruits and vegetables.  Biometrics: Pre Biometrics - 03/09/17 1102      Pre Biometrics   Height  5\' 11"  (1.803 m)    Weight  175 lb 11.3 oz (79.7 kg)    Waist Circumference  36.5 inches    Hip Circumference  37 inches    Waist to Hip Ratio  0.99 %    BMI (Calculated)  24.52    Triceps Skinfold  12 mm    % Body Fat  23.3 %    Grip Strength  52 kg    Flexibility  10 in    Single Leg Stand  30 seconds        Nutrition Therapy Plan and Nutrition Goals:   Nutrition Discharge: Nutrition Scores:   Nutrition Goals Re-Evaluation:   Nutrition Goals Re-Evaluation:   Nutrition Goals Discharge (  Final Nutrition Goals Re-Evaluation):   Psychosocial: Target Goals: Acknowledge presence or absence of significant depression and/or stress, maximize coping skills, provide positive support system. Participant is able to verbalize types and ability to use techniques and skills needed for reducing stress and depression.  Initial Review & Psychosocial Screening: Initial Psych Review & Screening - 03/09/17 1141      Initial Review   Current issues with  None Identified      Family Dynamics   Good Support System?  Yes pt wife is a Therapist, sports at [employer].  Pt feels supported in his recuperation.      Barriers   Psychosocial barriers to participate in program  There are no identifiable barriers or psychosocial needs.       Screening Interventions   Interventions  Encouraged to exercise       Quality of Life Scores: Quality of Life - 03/09/17 1102      Quality of Life Scores   Health/Function Pre  27.6 %    Socioeconomic Pre  30 %    Psych/Spiritual Pre  29.14 %    Family Pre  30 %    GLOBAL Pre  28.76 %       PHQ-9: Recent Review Flowsheet Data    There is no flowsheet data to display.     Interpretation of Total Score  Total Score Depression Severity:  1-4 = Minimal depression, 5-9 = Mild depression, 10-14 = Moderate depression, 15-19 = Moderately severe depression, 20-27 = Severe depression   Psychosocial Evaluation and Intervention:   Psychosocial Re-Evaluation:   Psychosocial Discharge (Final Psychosocial Re-Evaluation):   Vocational Rehabilitation: Provide vocational rehab assistance to qualifying candidates.   Vocational Rehab Evaluation & Intervention: Vocational Rehab - 03/09/17 1148      Initial Vocational Rehab Evaluation & Intervention   Assessment shows need for Vocational Rehabilitation  -- Pt has returned to work a couple of hours a day at a [employer].       Education: Education Goals: Education classes will be provided on a weekly basis, covering required topics. Participant will state understanding/return demonstration of topics presented.  Learning Barriers/Preferences: Learning Barriers/Preferences - 03/09/17 1108      Learning Barriers/Preferences   Learning Barriers  Sight    Learning Preferences  Written Material;Skilled Demonstration       Education Topics: Count Your Pulse:  -Group instruction provided by verbal instruction, demonstration, patient participation and written materials to support subject.  Instructors address importance of being able to find your pulse and how to count your pulse when at home without a heart monitor.  Patients get hands on experience counting their pulse with staff help and individually.   Heart Attack,  Angina, and Risk Factor Modification:  -Group instruction provided by verbal instruction, video, and written materials to support subject.  Instructors address signs and symptoms of angina and heart attacks.    Also discuss risk factors for heart disease and how to make changes to improve heart health risk factors.   Functional Fitness:  -Group instruction provided by verbal instruction, demonstration, patient participation, and written materials to support subject.  Instructors address safety measures for doing things around the house.  Discuss how to get up and down off the floor, how to pick things up properly, how to safely get out of a chair without assistance, and balance training.   Meditation and Mindfulness:  -Group instruction provided by verbal instruction, patient participation, and written materials to support subject.  Instructor  addresses importance of mindfulness and meditation practice to help reduce stress and improve awareness.  Instructor also leads participants through a meditation exercise.    Stretching for Flexibility and Mobility:  -Group instruction provided by verbal instruction, patient participation, and written materials to support subject.  Instructors lead participants through series of stretches that are designed to increase flexibility thus improving mobility.  These stretches are additional exercise for major muscle groups that are typically performed during regular warm up and cool down.   Hands Only CPR:  -Group verbal, video, and participation provides a basic overview of AHA guidelines for community CPR. Role-play of emergencies allow participants the opportunity to practice calling for help and chest compression technique with discussion of AED use.   Hypertension: -Group verbal and written instruction that provides a basic overview of hypertension including the most recent diagnostic guidelines, risk factor reduction with self-care instructions and  medication management.    Nutrition I class: Heart Healthy Eating:  -Group instruction provided by PowerPoint slides, verbal discussion, and written materials to support subject matter. The instructor gives an explanation and review of the Therapeutic Lifestyle Changes diet recommendations, which includes a discussion on lipid goals, dietary fat, sodium, fiber, plant stanol/sterol esters, sugar, and the components of a well-balanced, healthy diet.   Nutrition II class: Lifestyle Skills:  -Group instruction provided by PowerPoint slides, verbal discussion, and written materials to support subject matter. The instructor gives an explanation and review of label reading, grocery shopping for heart health, heart healthy recipe modifications, and ways to make healthier choices when eating out.   Diabetes Question & Answer:  -Group instruction provided by PowerPoint slides, verbal discussion, and written materials to support subject matter. The instructor gives an explanation and review of diabetes co-morbidities, pre- and post-prandial blood glucose goals, pre-exercise blood glucose goals, signs, symptoms, and treatment of hypoglycemia and hyperglycemia, and foot care basics.   Diabetes Blitz:  -Group instruction provided by PowerPoint slides, verbal discussion, and written materials to support subject matter. The instructor gives an explanation and review of the physiology behind type 1 and type 2 diabetes, diabetes medications and rational behind using different medications, pre- and post-prandial blood glucose recommendations and Hemoglobin A1c goals, diabetes diet, and exercise including blood glucose guidelines for exercising safely.    Portion Distortion:  -Group instruction provided by PowerPoint slides, verbal discussion, written materials, and food models to support subject matter. The instructor gives an explanation of serving size versus portion size, changes in portions sizes over the last  20 years, and what consists of a serving from each food group.   Stress Management:  -Group instruction provided by verbal instruction, video, and written materials to support subject matter.  Instructors review role of stress in heart disease and how to cope with stress positively.     Exercising on Your Own:  -Group instruction provided by verbal instruction, power point, and written materials to support subject.  Instructors discuss benefits of exercise, components of exercise, frequency and intensity of exercise, and end points for exercise.  Also discuss use of nitroglycerin and activating EMS.  Review options of places to exercise outside of rehab.  Review guidelines for sex with heart disease.   Cardiac Drugs I:  -Group instruction provided by verbal instruction and written materials to support subject.  Instructor reviews cardiac drug classes: antiplatelets, anticoagulants, beta blockers, and statins.  Instructor discusses reasons, side effects, and lifestyle considerations for each drug class.   Cardiac Drugs II:  -Group instruction provided  by verbal instruction and written materials to support subject.  Instructor reviews cardiac drug classes: angiotensin converting enzyme inhibitors (ACE-I), angiotensin II receptor blockers (ARBs), nitrates, and calcium channel blockers.  Instructor discusses reasons, side effects, and lifestyle considerations for each drug class.   Anatomy and Physiology of the Circulatory System:  Group verbal and written instruction and models provide basic cardiac anatomy and physiology, with the coronary electrical and arterial systems. Review of: AMI, Angina, Valve disease, Heart Failure, Peripheral Artery Disease, Cardiac Arrhythmia, Pacemakers, and the ICD.   Other Education:  -Group or individual verbal, written, or video instructions that support the educational goals of the cardiac rehab program.   Knowledge Questionnaire Score: Knowledge  Questionnaire Score - 03/09/17 1102      Knowledge Questionnaire Score   Pre Score  23/24       Core Components/Risk Factors/Patient Goals at Admission: Personal Goals and Risk Factors at Admission - 03/09/17 1106      Core Components/Risk Factors/Patient Goals on Admission    Weight Management  Yes;Weight Maintenance    Intervention  Weight Management: Develop a combined nutrition and exercise program designed to reach desired caloric intake, while maintaining appropriate intake of nutrient and fiber, sodium and fats, and appropriate energy expenditure required for the weight goal.;Weight Management: Provide education and appropriate resources to help participant work on and attain dietary goals.    Admit Weight  175 lb 11.3 oz (79.7 kg)    Goal Weight: Long Term  170 lb (77.1 kg)    Expected Outcomes  Short Term: Continue to assess and modify interventions until short term weight is achieved;Long Term: Adherence to nutrition and physical activity/exercise program aimed toward attainment of established weight goal;Weight Maintenance: Understanding of the daily nutrition guidelines, which includes 25-35% calories from fat, 7% or less cal from saturated fats, less than 200mg  cholesterol, less than 1.5gm of sodium, & 5 or more servings of fruits and vegetables daily;Understanding recommendations for meals to include 15-35% energy as protein, 25-35% energy from fat, 35-60% energy from carbohydrates, less than 200mg  of dietary cholesterol, 20-35 gm of total fiber daily;Understanding of distribution of calorie intake throughout the day with the consumption of 4-5 meals/snacks    Hypertension  Yes    Intervention  Provide education on lifestyle modifcations including regular physical activity/exercise, weight management, moderate sodium restriction and increased consumption of fresh fruit, vegetables, and low fat dairy, alcohol moderation, and smoking cessation.;Monitor prescription use compliance.     Expected Outcomes  Short Term: Continued assessment and intervention until BP is < 140/10mm HG in hypertensive participants. < 130/4mm HG in hypertensive participants with diabetes, heart failure or chronic kidney disease.;Long Term: Maintenance of blood pressure at goal levels.       Core Components/Risk Factors/Patient Goals Review:    Core Components/Risk Factors/Patient Goals at Discharge (Final Review):    ITP Comments: ITP Comments    Row Name 03/09/17 0903           ITP Comments  Dr. Fransico Him, Medical Director           Comments:  Patient attended orientation from 0830 to 1000 to review rules and guidelines for program. Completed 6 minute walk test, Intitial ITP, and exercise prescription.  VSS. Telemetry-SR first degree, occ PVc and PAC.  Pt was asymptomatic with no complaints. Brief psychosocial assessment reveals no immediate barriers to participating in cardiac rehab.  Pt feels supported in his recovery by his wife who is a Therapist, sports at [employer]. Pt  is looking forward to returning to rehab next week. Cherre Huger, BSN Cardiac and Training and development officer

## 2017-03-15 ENCOUNTER — Encounter (HOSPITAL_COMMUNITY)
Admission: RE | Admit: 2017-03-15 | Discharge: 2017-03-15 | Disposition: A | Discharge status: Home / Self Care | Payer: Managed Care, Other (non HMO) | Source: Ambulatory Visit | Attending: Cardiology | Admitting: Cardiology

## 2017-03-15 DIAGNOSIS — Z9889 Other specified postprocedural states: Secondary | ICD-10-CM

## 2017-03-15 NOTE — Progress Notes (Signed)
Daily Session Note  Patient Details  Name: Alan Mcguire MRN: [id number] Date of Birth: [birth year] Referring Provider:     CARDIAC REHAB PHASE II ORIENTATION from 03/09/2017 in [place]  Referring Provider  [name], [name], MD      Encounter Date: 03/15/2017  Check In: Session Check In - 03/15/17 1522      Check-In   Location  [facility]    Staff Present  Dorna Bloom, MS, ACSM RCEP, Exercise Physiologist;Tyara Nevels, MS,ACSM CEP, Exercise Physiologist;Joban Colledge, RN, BSN    Supervising physician immediately available to respond to emergencies  [facility] immediately available    Physician(s)  Dr. Starla Link    Medication changes reported      No    Fall or balance concerns reported     No    Tobacco Cessation  No Change    Warm-up and Cool-down  Performed as group-led instruction    Resistance Training Performed  No    VAD Patient?  No      Pain Assessment   Currently in Pain?  No/denies    Multiple Pain Sites  No       Capillary Blood Glucose: No results found for this or any previous visit (from the past 24 hour(s)).    Social History   Tobacco Use  Smoking Status Never Smoker  Smokeless Tobacco Never Used    Goals Met:  Exercise tolerated well  Goals Unmet:  Not Applicable  Comments: [name] started cardiac rehab today.  Pt tolerated light exercise without difficulty. VSS, telemetry-Sinus rhythm asymptomatic.  Medication list reconciled. Pt denies barriers to medicaiton compliance.  PSYCHOSOCIAL ASSESSMENT:  PHQ-0. Pt exhibits positive coping skills, hopeful outlook with supportive family. No psychosocial needs identified at this time, no psychosocial interventions necessary.    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   Pt oriented to exercise equipment and routine.    Understanding verbalized.Barnet Pall, RN,BSN 03/15/2017 4:32 PM   Dr. Fransico Him is Medical Director for Cardiac  Rehab at [facility].

## 2017-03-17 ENCOUNTER — Encounter (HOSPITAL_COMMUNITY)
Admission: RE | Admit: 2017-03-17 | Discharge: 2017-03-17 | Disposition: A | Discharge status: Home / Self Care | Payer: Managed Care, Other (non HMO) | Source: Ambulatory Visit | Attending: Cardiology | Admitting: Cardiology

## 2017-03-17 DIAGNOSIS — Z9889 Other specified postprocedural states: Secondary | ICD-10-CM | POA: Diagnosis not present

## 2017-03-20 ENCOUNTER — Encounter (HOSPITAL_COMMUNITY)
Admission: RE | Admit: 2017-03-20 | Discharge: 2017-03-20 | Disposition: A | Discharge status: Home / Self Care | Payer: Managed Care, Other (non HMO) | Source: Ambulatory Visit | Attending: Cardiology | Admitting: Cardiology

## 2017-03-20 DIAGNOSIS — Z9889 Other specified postprocedural states: Secondary | ICD-10-CM | POA: Diagnosis not present

## 2017-03-22 ENCOUNTER — Encounter (HOSPITAL_COMMUNITY)
Admission: RE | Admit: 2017-03-22 | Discharge: 2017-03-22 | Disposition: A | Discharge status: Home / Self Care | Payer: Managed Care, Other (non HMO) | Source: Ambulatory Visit | Attending: Cardiology | Admitting: Cardiology

## 2017-03-22 ENCOUNTER — Ambulatory Visit (INDEPENDENT_AMBULATORY_CARE_PROVIDER_SITE_OTHER): Payer: Managed Care, Other (non HMO)

## 2017-03-22 DIAGNOSIS — Z9889 Other specified postprocedural states: Secondary | ICD-10-CM

## 2017-03-22 DIAGNOSIS — Z5181 Encounter for therapeutic drug level monitoring: Secondary | ICD-10-CM | POA: Diagnosis not present

## 2017-03-22 LAB — POCT INR: INR: 1.4

## 2017-03-22 NOTE — Patient Instructions (Signed)
Description   Take 1.5 tablets today and tomorrow, then start taking 1 tablet (5mg ) daily except 1/2 tablet (2.5mg ) on Mondays. Recheck INR in 10 days. Call with any new medications any new scheduled procedures or with any questions or concerns [phone]

## 2017-03-24 ENCOUNTER — Encounter (HOSPITAL_COMMUNITY)
Admission: RE | Admit: 2017-03-24 | Discharge: 2017-03-24 | Disposition: A | Discharge status: Home / Self Care | Payer: Managed Care, Other (non HMO) | Source: Ambulatory Visit | Attending: Cardiology | Admitting: Cardiology

## 2017-03-24 DIAGNOSIS — Z9889 Other specified postprocedural states: Secondary | ICD-10-CM

## 2017-03-27 ENCOUNTER — Encounter (HOSPITAL_COMMUNITY)
Admission: RE | Admit: 2017-03-27 | Discharge: 2017-03-27 | Disposition: A | Discharge status: Home / Self Care | Payer: Managed Care, Other (non HMO) | Source: Ambulatory Visit | Attending: Cardiology | Admitting: Cardiology

## 2017-03-27 DIAGNOSIS — Z9889 Other specified postprocedural states: Secondary | ICD-10-CM

## 2017-03-27 NOTE — Progress Notes (Signed)
Reviewed home exercise guidelines with patient including endpoints, temperature precautions, target heart rate and rate of perceived exertion. Pt is walking 20 minutes daily as his mode of home exercise. Discussed increasing duration from 20 to 30 minutes and patient is amenable to this. Pt voices understanding of instructions given. Sol Passer, MS, ACSM CEP

## 2017-03-29 ENCOUNTER — Encounter (HOSPITAL_COMMUNITY)
Admission: RE | Admit: 2017-03-29 | Discharge: 2017-03-29 | Disposition: A | Discharge status: Home / Self Care | Payer: Managed Care, Other (non HMO) | Source: Ambulatory Visit | Attending: Cardiology | Admitting: Cardiology

## 2017-03-29 DIAGNOSIS — Z9889 Other specified postprocedural states: Secondary | ICD-10-CM

## 2017-03-30 ENCOUNTER — Telehealth: Payer: Self-pay | Admitting: Cardiology

## 2017-03-30 NOTE — Telephone Encounter (Signed)
Alan Mcguire is calling to give the name of the pharmacy to send int he medication which is [facility] on [address] .Marland Kitchen Please call if you have any questions . Thanks

## 2017-03-30 NOTE — Telephone Encounter (Signed)
Not sure if patient needed refills if so not sure what he needed filled. Left message to call back

## 2017-03-31 ENCOUNTER — Encounter (HOSPITAL_COMMUNITY)
Admission: RE | Admit: 2017-03-31 | Discharge: 2017-03-31 | Disposition: A | Discharge status: Home / Self Care | Payer: Managed Care, Other (non HMO) | Source: Ambulatory Visit | Attending: Cardiology | Admitting: Cardiology

## 2017-03-31 DIAGNOSIS — Z9889 Other specified postprocedural states: Secondary | ICD-10-CM | POA: Diagnosis not present

## 2017-03-31 MED ORDER — WARFARIN SODIUM 5 MG PO TABS: ORAL_TABLET | ORAL | 1 refills | Status: DC

## 2017-03-31 MED ORDER — AMOXICILLIN 500 MG PO TABS: ORAL_TABLET | ORAL | 6 refills | Status: DC

## 2017-03-31 NOTE — Telephone Encounter (Signed)
Spoke with pt wife, refill for antibiotics prior to dental procedures sent to the pharmacy electronically. He is also needing refill for warfarin. Forwarded to pharm md

## 2017-04-03 ENCOUNTER — Encounter (HOSPITAL_COMMUNITY)
Admission: RE | Admit: 2017-04-03 | Discharge: 2017-04-03 | Disposition: A | Discharge status: Home / Self Care | Payer: Managed Care, Other (non HMO) | Source: Ambulatory Visit | Attending: Cardiology | Admitting: Cardiology

## 2017-04-03 ENCOUNTER — Ambulatory Visit (INDEPENDENT_AMBULATORY_CARE_PROVIDER_SITE_OTHER): Payer: Managed Care, Other (non HMO) | Admitting: *Deleted

## 2017-04-03 DIAGNOSIS — Z5181 Encounter for therapeutic drug level monitoring: Secondary | ICD-10-CM | POA: Diagnosis not present

## 2017-04-03 DIAGNOSIS — Z9889 Other specified postprocedural states: Secondary | ICD-10-CM

## 2017-04-03 LAB — POCT INR: INR: 1.7

## 2017-04-03 MED ORDER — WARFARIN SODIUM 5 MG PO TABS: ORAL_TABLET | ORAL | 1 refills | Status: DC

## 2017-04-03 NOTE — Progress Notes (Signed)
Alan Mcguire 62 y.o. male DOB: [birth year] MRN: [id number]      Nutrition Note  1. S/P mitral valve repair    Meds reviewed. Coumadin noted  Note Spoke with pt. Nutrition plan and survey reviewed with pt. Pt is following Step 1 of the Therapeutic Lifestyle Changes diet. Pt is on Coumadin. Per discussion, pt is watching his vitamin K intake. Pt previously given nutrition class handouts. Pt denies any questions re: information given. Pt expressed understanding of the information reviewed.   Nutrition Diagnosis ? Food-and nutrition-related knowledge deficit related to lack of exposure to information as related to diagnosis of: ? MVR  Nutrition Intervention ? Pt's individual nutrition plan and goals reviewed with pt. ? Benefits of adopting Heart Healthy diet discussed when Medficts reviewed.  Nutrition Goal(s): Pt does not have any specific nutrition goals he wants to focus on while in rehab. ? Pt to identify and limit food sources of saturated fat, trans fat, and sodium  Plan:  Pt to attend nutrition classes ? Portion Distortion  Will provide client-centered nutrition education as part of interdisciplinary care.   Monitor and evaluate progress toward nutrition goal with team.  Derek Mound, M.Ed, RD, LDN, CDE 04/03/2017 3:43 PM

## 2017-04-03 NOTE — Patient Instructions (Signed)
Description   Today take 1 tablet then start the dose we changed we changed you to: 1 tablet (5mg ) daily except 1/2 tablet (2.5mg ) on Mondays. Recheck INR in 10 days. Call with any new medications any new scheduled procedures or with any questions or concerns [phone]

## 2017-04-05 ENCOUNTER — Encounter (HOSPITAL_COMMUNITY)
Admission: RE | Admit: 2017-04-05 | Discharge: 2017-04-05 | Disposition: A | Discharge status: Home / Self Care | Payer: Managed Care, Other (non HMO) | Source: Ambulatory Visit | Attending: Cardiology | Admitting: Cardiology

## 2017-04-05 DIAGNOSIS — Z9889 Other specified postprocedural states: Secondary | ICD-10-CM

## 2017-04-05 NOTE — Progress Notes (Signed)
Cardiac Individual Treatment Plan  Patient Details  Name: Alan Mcguire MRN: [id number] Date of Birth: 1955/09/11 Referring Provider:     CARDIAC REHAB PHASE II ORIENTATION from 03/09/2017 in [place]  Referring Provider  [name], [name], MD      Initial Encounter Date:    CARDIAC REHAB PHASE II ORIENTATION from 03/09/2017 in [place]  Date  03/09/17  Referring Provider  [name], [name], MD      Visit Diagnosis: S/P mitral valve repair  Patient's Home Medications on Admission:  Current Outpatient Medications:  .  acetaminophen (TYLENOL) 500 MG tablet, Take 2 tablets (1,000 mg total) every 6 (six) hours by mouth. (Patient not taking: Reported on 03/09/2017), Disp: 30 tablet, Rfl: 0 .  amoxicillin (AMOXIL) 500 MG tablet, Take all 4 tablets one hour prior to dental procedure, Disp: 4 tablet, Rfl: 6 .  metoprolol succinate (TOPROL XL) 25 MG 24 hr tablet, Take 1 tablet (25 mg total) by mouth daily., Disp: 30 tablet, Rfl: 3 .  Multiple Vitamin (MULTIVITAMIN) capsule, Take 1 capsule by mouth daily., Disp: , Rfl:  .  warfarin (COUMADIN) 5 MG tablet, Take 1/2 tablet to 1 tablet by mouth daily or as directed by coumadin clinic, Disp: 30 tablet, Rfl: 1  Past Medical History: Past Medical History:  Diagnosis Date  . Chronic lower back pain   . Lumbar disc disease    L4-5  . Murmur 9/92011   echo-MVP posterior leaflet with moderate mitral insufficiency and the jet directed anteriorly,left atrium mildly enlarged  . MVP (mitral valve prolapse)   . Non-rheumatic mitral regurgitation   . Pneumonia 1990s   hx walking pneumonia  . 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   Complex valvuloplasty including triangular resection of flail segment of posterior leaflet, artificial Gore-tex neochord placement x4 and 34 mm Sorin Memo 3D ring annuloplasty via right mini thoracotomy approach    Tobacco Use: Social  History   Tobacco Use  Smoking Status Never Smoker  Smokeless Tobacco Never Used    Labs: Recent Review Flowsheet Data    Labs for ITP Cardiac and Pulmonary Rehab Latest Ref Rng & Units 01/24/2017 01/24/2017 01/24/2017 01/24/2017 01/25/2017   Cholestrol 0 - 200 mg/dL - - - - -   LDLCALC 0 - 99 mg/dL - - - - -   LDLDIRECT mg/dL - - - - -   HDL >39.00 mg/dL - - - - -   Trlycerides 0.0 - 149.0 mg/dL - - - - -   Hemoglobin A1c 4.8 - 5.6 % - - - - -   PHART 7.350 - 7.450 7.284(L) 7.383 7.328(L) - 7.374   PCO2ART 32.0 - 48.0 mmHg 49.7(H) 41.8 45.4 - 42.1   HCO3 20.0 - 28.0 mmol/L 24.1 25.0 23.9 - 24.0   TCO2 22 - 32 mmol/L 26 26 25 24  20(L)   ACIDBASEDEF 0.0 - 2.0 mmol/L 4.0(H) - 2.0 - 0.5   O2SAT % 96.0 99.0 98.0 - 97.6      Capillary Blood Glucose: Lab Results  Component Value Date   GLUCAP 110 (H) 01/26/2017   GLUCAP 105 (H) 01/25/2017   GLUCAP 136 (H) 01/25/2017   GLUCAP 146 (H) 01/25/2017   GLUCAP 110 (H) 01/25/2017     Exercise Target Goals:    Exercise Program Goal: Individual exercise prescription set using results from initial 6 min walk test and THRR while considering  patient's activity barriers and safety.  Exercise Prescription Goal: Initial exercise prescription builds to 30-45 minutes a day of aerobic activity, 2-3 days per week.  Home exercise guidelines will be given to patient during program as part of exercise prescription that the participant will acknowledge.  Activity Barriers & Risk Stratification: Activity Barriers & Cardiac Risk Stratification - 03/09/17 1125      Activity Barriers & Cardiac Risk Stratification   Activity Barriers  Incisional Pain    Cardiac Risk Stratification  High       6 Minute Walk: 6 Minute Walk    Row Name 03/09/17 1105         6 Minute Walk   Phase  Initial     Distance  1929 feet     Walk Time  6 minutes     # of Rest Breaks  0     MPH  3.65     METS  4.82     RPE  11     Perceived Dyspnea   0     VO2  Peak  16.85     Symptoms  No     Resting HR  91 bpm     Resting BP  102/70     Resting Oxygen Saturation   98 %     Exercise Oxygen Saturation  during 6 min walk  98 %     Max Ex. HR  109 bpm     Max Ex. BP  122/80     2 Minute Post BP  104/82        Oxygen Initial Assessment:   Oxygen Re-Evaluation:   Oxygen Discharge (Final Oxygen Re-Evaluation):   Initial Exercise Prescription: Initial Exercise Prescription - 03/09/17 1100      Date of Initial Exercise RX and Referring Provider   Date  03/09/17    Referring Provider  [name], [name], MD      Treadmill   MPH  3.3    Grade  1    Minutes  10    METs  3.98      Bike   Level  1.3    Minutes  10    METs  4.07      NuStep   Level  3    SPM  70    Minutes  10    METs  3.5      Prescription Details   Frequency (times per week)  3x    Duration  Progress to 30 minutes of continuous aerobic without signs/symptoms of physical distress      Intensity   THRR 40-80% of Max Heartrate  64-128    Ratings of Perceived Exertion  11-13    Perceived Dyspnea  0-4      Progression   Progression  Continue progressive overload as per policy without signs/symptoms or physical distress.      Resistance Training   Training Prescription  Yes    Weight  2lbs    Reps  10-15       Perform Capillary Blood Glucose checks as needed.  Exercise Prescription Changes: Exercise Prescription Changes    Row Name 03/15/17 0657 03/16/17 0600 03/27/17 1447         Response to Exercise   Blood Pressure (Admit)  118/70  -  120/80     Blood Pressure (Exercise)  138/78  -  162/80     Blood Pressure (Exit)  100/60  -  104/70     Heart Rate (Admit)  88 bpm  -  86 bpm     Heart Rate (Exercise)  120 bpm  -  120 bpm     Heart Rate (Exit)  95 bpm  -  85 bpm     Rating of Perceived Exertion (Exercise)  -  -  14     Symptoms  None  -  None     Comments  Pt oriented to exercise equipment  -  -     Duration  Progress to 30 minutes of  aerobic  without signs/symptoms of physical distress  -  Progress to 30 minutes of  aerobic without signs/symptoms of physical distress     Intensity  THRR unchanged  -  THRR unchanged       Progression   Progression  Continue to progress workloads to maintain intensity without signs/symptoms of physical distress.  -  Continue to progress workloads to maintain intensity without signs/symptoms of physical distress.     Average METs  3.34  -  4.9       Resistance Training   Training Prescription  No  -  Yes     Weight  -  -  4lbs     Reps  -  -  10-15     Time  -  -  10 Minutes       Interval Training   Interval Training  No  -  No       Treadmill   MPH  3.3  -  3.3     Grade  1  -  3     Minutes  10  -  10     METs  3.98  -  4.89       Bike   Level  1.3  -  -     Minutes  10  -  -     METs  4.07  -  -       Recumbant Bike   Level  -  -  4 SciFit Bike     Minutes  -  -  10       NuStep   Level  3  -  6     SPM  70  -  85     Minutes  10  -  10     METs  2.7  -  4.9       Home Exercise Plan   Plans to continue exercise at  -  -  Home (comment)     Frequency  -  -  Add 4 additional days to program exercise sessions.     Initial Home Exercises Provided  -  -  03/27/17        Exercise Comments: Exercise Comments    Row Name 03/15/17 0659 03/27/17 1507         Exercise Comments  Pt oriented to exercise equipment. Pt tolerated exercise well. Will continue to monitor pt.   Reviewed home exercise guidelines, METs and goals with patient.         Exercise Goals and Review: Exercise Goals    Row Name 03/09/17 1104             Exercise Goals   Increase Physical Activity  Yes       Intervention  Provide advice, education, support and counseling about physical activity/exercise needs.;Develop an individualized exercise prescription for aerobic and resistive training based on initial evaluation findings, risk stratification, comorbidities and participant's personal goals.  Expected Outcomes  Achievement of increased cardiorespiratory fitness and enhanced flexibility, muscular endurance and strength shown through measurements of functional capacity and personal statement of participant.       Increase Strength and Stamina  Yes       Intervention  Provide advice, education, support and counseling about physical activity/exercise needs.;Develop an individualized exercise prescription for aerobic and resistive training based on initial evaluation findings, risk stratification, comorbidities and participant's personal goals.       Expected Outcomes  Achievement of increased cardiorespiratory fitness and enhanced flexibility, muscular endurance and strength shown through measurements of functional capacity and personal statement of participant.       Able to understand and use rate of perceived exertion (RPE) scale  Yes       Intervention  Provide education and explanation on how to use RPE scale       Expected Outcomes  Short Term: Able to use RPE daily in rehab to express subjective intensity level;Long Term:  Able to use RPE to guide intensity level when exercising independently       Knowledge and understanding of Target Heart Rate Range (THRR)  Yes       Intervention  Provide education and explanation of THRR including how the numbers were predicted and where they are located for reference       Expected Outcomes  Short Term: Able to use daily as guideline for intensity in rehab;Long Term: Able to use THRR to govern intensity when exercising independently;Short Term: Able to state/look up THRR       Able to check pulse independently  Yes       Intervention  Provide education and demonstration on how to check pulse in carotid and radial arteries.;Review the importance of being able to check your own pulse for safety during independent exercise       Expected Outcomes  Short Term: Able to explain why pulse checking is important during independent exercise;Long Term: Able to  check pulse independently and accurately       Understanding of Exercise Prescription  Yes       Intervention  Provide education, explanation, and written materials on patient's individual exercise prescription       Expected Outcomes  Short Term: Able to explain program exercise prescription;Long Term: Able to explain home exercise prescription to exercise independently          Exercise Goals Re-Evaluation : Exercise Goals Re-Evaluation    Row Name 03/27/17 1716             Exercise Goal Re-Evaluation   Exercise Goals Review  Increase Physical Activity;Understanding of Exercise Prescription;Able to understand and use rate of perceived exertion (RPE) scale;Knowledge and understanding of Target Heart Rate Range (THRR)       Comments  Reviewed home exercise guidelines with patient including THRR, RPE scale and endpoints for exercise. Pt is currently walking 20 minutes daily in addition to exercise at [facility].       Expected Outcomes  Increase exercise duration at home from 20 minutes to 30 minutes daily.           Discharge Exercise Prescription (Final Exercise Prescription Changes): Exercise Prescription Changes - 03/27/17 1447      Response to Exercise   Blood Pressure (Admit)  120/80    Blood Pressure (Exercise)  162/80    Blood Pressure (Exit)  104/70    Heart Rate (Admit)  86 bpm    Heart Rate (Exercise)  120 bpm  Heart Rate (Exit)  85 bpm    Rating of Perceived Exertion (Exercise)  14    Symptoms  None    Duration  Progress to 30 minutes of  aerobic without signs/symptoms of physical distress    Intensity  THRR unchanged      Progression   Progression  Continue to progress workloads to maintain intensity without signs/symptoms of physical distress.    Average METs  4.9      Resistance Training   Training Prescription  Yes    Weight  4lbs    Reps  10-15    Time  10 Minutes      Interval Training   Interval Training  No      Treadmill   MPH  3.3    Grade  3     Minutes  10    METs  4.89      Bike   Level  --    Minutes  --    METs  --      Recumbant Bike   Level  4 SciFit Bike    Minutes  10      NuStep   Level  6    SPM  85    Minutes  10    METs  4.9      Home Exercise Plan   Plans to continue exercise at  Home (comment)    Frequency  Add 4 additional days to program exercise sessions.    Initial Home Exercises Provided  03/27/17       Nutrition:  Target Goals: Understanding of nutrition guidelines, daily intake of sodium 1500mg , cholesterol 200mg , calories 30% from fat and 7% or less from saturated fats, daily to have 5 or more servings of fruits and vegetables.  Biometrics: Pre Biometrics - 03/09/17 1102      Pre Biometrics   Height  5\' 11"  (1.803 m)    Weight  175 lb 11.3 oz (79.7 kg)    Waist Circumference  36.5 inches    Hip Circumference  37 inches    Waist to Hip Ratio  0.99 %    BMI (Calculated)  24.52    Triceps Skinfold  12 mm    % Body Fat  23.3 %    Grip Strength  52 kg    Flexibility  10 in    Single Leg Stand  30 seconds        Nutrition Therapy Plan and Nutrition Goals: Nutrition Therapy & Goals - 03/09/17 1506      Nutrition Therapy   Diet  Heart Healthy      Personal Nutrition Goals   Nutrition Goal  Pt to identify and limit food sources of saturated fat, trans fat, and sodium      Intervention Plan   Intervention  Prescribe, educate and counsel regarding individualized specific dietary modifications aiming towards targeted core components such as weight, hypertension, lipid management, diabetes, heart failure and other comorbidities.    Expected Outcomes  Short Term Goal: Understand basic principles of dietary content, such as calories, fat, sodium, cholesterol and nutrients.;Long Term Goal: Adherence to prescribed nutrition plan.       Nutrition Assessments: Nutrition Assessments - 03/09/17 1506      MEDFICTS Scores   Pre Score  59       Nutrition Goals  Re-Evaluation:   Nutrition Goals Re-Evaluation:   Nutrition Goals Discharge (Final Nutrition Goals Re-Evaluation):   Psychosocial: Target Goals: Acknowledge presence or absence of significant  depression and/or stress, maximize coping skills, provide positive support system. Participant is able to verbalize types and ability to use techniques and skills needed for reducing stress and depression.  Initial Review & Psychosocial Screening: Initial Psych Review & Screening - 03/09/17 1141      Initial Review   Current issues with  None Identified      Family Dynamics   Good Support System?  Yes pt wife is a Therapist, sports at [employer].  Pt feels supported in his recuperation.      Barriers   Psychosocial barriers to participate in program  There are no identifiable barriers or psychosocial needs.      Screening Interventions   Interventions  Encouraged to exercise       Quality of Life Scores: Quality of Life - 03/09/17 1102      Quality of Life Scores   Health/Function Pre  27.6 %    Socioeconomic Pre  30 %    Psych/Spiritual Pre  29.14 %    Family Pre  30 %    GLOBAL Pre  28.76 %      Scores of 19 and below usually indicate a poorer quality of life in these areas.  A difference of  2-3 points is a clinically meaningful difference.  A difference of 2-3 points in the total score of the Quality of Life Index has been associated with significant improvement in overall quality of life, self-image, physical symptoms, and general health in studies assessing change in quality of life.  PHQ-9: Recent Review Flowsheet Data    Depression screen [facility] 2/9 03/15/2017   Decreased Interest 0   Down, Depressed, Hopeless 0   PHQ - 2 Score 0     Interpretation of Total Score  Total Score Depression Severity:  1-4 = Minimal depression, 5-9 = Mild depression, 10-14 = Moderate depression, 15-19 = Moderately severe depression, 20-27 = Severe depression   Psychosocial Evaluation and  Intervention:   Psychosocial Re-Evaluation: Psychosocial Re-Evaluation    [place] Name 04/05/17 1714             Psychosocial Re-Evaluation   Current issues with  None Identified       Interventions  Encouraged to attend Cardiac Rehabilitation for the exercise       Continue Psychosocial Services   No Follow up required          Psychosocial Discharge (Final Psychosocial Re-Evaluation): Psychosocial Re-Evaluation - 04/05/17 1714      Psychosocial Re-Evaluation   Current issues with  None Identified    Interventions  Encouraged to attend Cardiac Rehabilitation for the exercise    Continue Psychosocial Services   No Follow up required       Vocational Rehabilitation: Provide vocational rehab assistance to qualifying candidates.   Vocational Rehab Evaluation & Intervention: Vocational Rehab - 03/09/17 1148      Initial Vocational Rehab Evaluation & Intervention   Assessment shows need for Vocational Rehabilitation  -- Pt has returned to work a couple of hours a day at a [employer].       Education: Education Goals: Education classes will be provided on a weekly basis, covering required topics. Participant will state understanding/return demonstration of topics presented.  Learning Barriers/Preferences: Learning Barriers/Preferences - 03/09/17 1108      Learning Barriers/Preferences   Learning Barriers  Sight    Learning Preferences  Written Material;Skilled Demonstration       Education Topics: Count Your Pulse:  -Group instruction provided by verbal  instruction, demonstration, patient participation and written materials to support subject.  Instructors address importance of being able to find your pulse and how to count your pulse when at home without a heart monitor.  Patients get hands on experience counting their pulse with staff help and individually.   Heart Attack, Angina, and Risk Factor Modification:  -Group instruction provided by verbal  instruction, video, and written materials to support subject.  Instructors address signs and symptoms of angina and heart attacks.    Also discuss risk factors for heart disease and how to make changes to improve heart health risk factors.   Functional Fitness:  -Group instruction provided by verbal instruction, demonstration, patient participation, and written materials to support subject.  Instructors address safety measures for doing things around the house.  Discuss how to get up and down off the floor, how to pick things up properly, how to safely get out of a chair without assistance, and balance training.   Meditation and Mindfulness:  -Group instruction provided by verbal instruction, patient participation, and written materials to support subject.  Instructor addresses importance of mindfulness and meditation practice to help reduce stress and improve awareness.  Instructor also leads participants through a meditation exercise.    Stretching for Flexibility and Mobility:  -Group instruction provided by verbal instruction, patient participation, and written materials to support subject.  Instructors lead participants through series of stretches that are designed to increase flexibility thus improving mobility.  These stretches are additional exercise for major muscle groups that are typically performed during regular warm up and cool down.   CARDIAC REHAB PHASE II EXERCISE from 03/29/2017 in [place]  Date  03/17/17  Educator  EP  Instruction Review Code  2- meets goals/outcomes      Hands Only CPR:  -Group verbal, video, and participation provides a basic overview of AHA guidelines for community CPR. Role-play of emergencies allow participants the opportunity to practice calling for help and chest compression technique with discussion of AED use.   Hypertension: -Group verbal and written instruction that provides a basic overview of hypertension  including the most recent diagnostic guidelines, risk factor reduction with self-care instructions and medication management.   CARDIAC REHAB PHASE II EXERCISE from 03/29/2017 in [place]  Date  03/24/17  Instruction Review Code  2- meets goals/outcomes       Nutrition I class: Heart Healthy Eating:  -Group instruction provided by PowerPoint slides, verbal discussion, and written materials to support subject matter. The instructor gives an explanation and review of the Therapeutic Lifestyle Changes diet recommendations, which includes a discussion on lipid goals, dietary fat, sodium, fiber, plant stanol/sterol esters, sugar, and the components of a well-balanced, healthy diet.   Nutrition II class: Lifestyle Skills:  -Group instruction provided by PowerPoint slides, verbal discussion, and written materials to support subject matter. The instructor gives an explanation and review of label reading, grocery shopping for heart health, heart healthy recipe modifications, and ways to make healthier choices when eating out.   Diabetes Question & Answer:  -Group instruction provided by PowerPoint slides, verbal discussion, and written materials to support subject matter. The instructor gives an explanation and review of diabetes co-morbidities, pre- and post-prandial blood glucose goals, pre-exercise blood glucose goals, signs, symptoms, and treatment of hypoglycemia and hyperglycemia, and foot care basics.   Diabetes Blitz:  -Group instruction provided by PowerPoint slides, verbal discussion, and written materials to support subject matter. The instructor gives  an explanation and review of the physiology behind type 1 and type 2 diabetes, diabetes medications and rational behind using different medications, pre- and post-prandial blood glucose recommendations and Hemoglobin A1c goals, diabetes diet, and exercise including blood glucose guidelines for exercising safely.     Portion Distortion:  -Group instruction provided by PowerPoint slides, verbal discussion, written materials, and food models to support subject matter. The instructor gives an explanation of serving size versus portion size, changes in portions sizes over the last 20 years, and what consists of a serving from each food group.   Stress Management:  -Group instruction provided by verbal instruction, video, and written materials to support subject matter.  Instructors review role of stress in heart disease and how to cope with stress positively.     Exercising on Your Own:  -Group instruction provided by verbal instruction, power point, and written materials to support subject.  Instructors discuss benefits of exercise, components of exercise, frequency and intensity of exercise, and end points for exercise.  Also discuss use of nitroglycerin and activating EMS.  Review options of places to exercise outside of rehab.  Review guidelines for sex with heart disease.   CARDIAC REHAB PHASE II EXERCISE from 03/29/2017 in [place]  Date  03/29/17  Educator  EP  Instruction Review Code  2- meets goals/outcomes      Cardiac Drugs I:  -Group instruction provided by verbal instruction and written materials to support subject.  Instructor reviews cardiac drug classes: antiplatelets, anticoagulants, beta blockers, and statins.  Instructor discusses reasons, side effects, and lifestyle considerations for each drug class.   CARDIAC REHAB PHASE II EXERCISE from 03/29/2017 in [place]  Date  03/22/17  Instruction Review Code  2- meets goals/outcomes      Cardiac Drugs II:  -Group instruction provided by verbal instruction and written materials to support subject.  Instructor reviews cardiac drug classes: angiotensin converting enzyme inhibitors (ACE-I), angiotensin II receptor blockers (ARBs), nitrates, and calcium channel blockers.   Instructor discusses reasons, side effects, and lifestyle considerations for each drug class.   Anatomy and Physiology of the Circulatory System:  Group verbal and written instruction and models provide basic cardiac anatomy and physiology, with the coronary electrical and arterial systems. Review of: AMI, Angina, Valve disease, Heart Failure, Peripheral Artery Disease, Cardiac Arrhythmia, Pacemakers, and the ICD.   Other Education:  -Group or individual verbal, written, or video instructions that support the educational goals of the cardiac rehab program.   Knowledge Questionnaire Score: Knowledge Questionnaire Score - 03/09/17 1102      Knowledge Questionnaire Score   Pre Score  23/24       Core Components/Risk Factors/Patient Goals at Admission: Personal Goals and Risk Factors at Admission - 03/09/17 1106      Core Components/Risk Factors/Patient Goals on Admission    Weight Management  Yes;Weight Maintenance    Intervention  Weight Management: Develop a combined nutrition and exercise program designed to reach desired caloric intake, while maintaining appropriate intake of nutrient and fiber, sodium and fats, and appropriate energy expenditure required for the weight goal.;Weight Management: Provide education and appropriate resources to help participant work on and attain dietary goals.    Admit Weight  175 lb 11.3 oz (79.7 kg)    Goal Weight: Long Term  170 lb (77.1 kg)    Expected Outcomes  Short Term: Continue to assess and modify interventions until short term weight is achieved;Long Term: Adherence  to nutrition and physical activity/exercise program aimed toward attainment of established weight goal;Weight Maintenance: Understanding of the daily nutrition guidelines, which includes 25-35% calories from fat, 7% or less cal from saturated fats, less than 200mg  cholesterol, less than 1.5gm of sodium, & 5 or more servings of fruits and vegetables daily;Understanding recommendations  for meals to include 15-35% energy as protein, 25-35% energy from fat, 35-60% energy from carbohydrates, less than 200mg  of dietary cholesterol, 20-35 gm of total fiber daily;Understanding of distribution of calorie intake throughout the day with the consumption of 4-5 meals/snacks    Hypertension  Yes    Intervention  Provide education on lifestyle modifcations including regular physical activity/exercise, weight management, moderate sodium restriction and increased consumption of fresh fruit, vegetables, and low fat dairy, alcohol moderation, and smoking cessation.;Monitor prescription use compliance.    Expected Outcomes  Short Term: Continued assessment and intervention until BP is < 140/59mm HG in hypertensive participants. < 130/52mm HG in hypertensive participants with diabetes, heart failure or chronic kidney disease.;Long Term: Maintenance of blood pressure at goal levels.       Core Components/Risk Factors/Patient Goals Review:  Goals and Risk Factor Review    Row Name 04/05/17 1711             Core Components/Risk Factors/Patient Goals Review   Personal Goals Review  Weight Management/Obesity;Hypertension       Review  Angie's vital signs have been stable at cardiac rehab. Angie's weights have been stable.       Expected Outcomes  Freemon will continue to take her medications as presribed and come to cardiac rehab to exercise.          Core Components/Risk Factors/Patient Goals at Discharge (Final Review):  Goals and Risk Factor Review - 04/05/17 1711      Core Components/Risk Factors/Patient Goals Review   Personal Goals Review  Weight Management/Obesity;Hypertension    Review  Angie's vital signs have been stable at cardiac rehab. Angie's weights have been stable.    Expected Outcomes  Edgel will continue to take her medications as presribed and come to cardiac rehab to exercise.       ITP Comments: ITP Comments    Row Name 03/09/17 [phone] 04/05/17 1710         ITP  Comments  Dr. Fransico Him, Medical Director   30 day ITP review. Patient has good attendance and participation in phase 2 cardiac rehab         Comments: See ITP comments.Barnet Pall, RN,BSN 04/05/2017 5:16 PM

## 2017-04-07 ENCOUNTER — Encounter (HOSPITAL_COMMUNITY): Payer: Managed Care, Other (non HMO)

## 2017-04-10 ENCOUNTER — Encounter (HOSPITAL_COMMUNITY): Payer: Managed Care, Other (non HMO)

## 2017-04-10 ENCOUNTER — Telehealth (HOSPITAL_COMMUNITY): Payer: Self-pay | Admitting: *Deleted

## 2017-04-10 NOTE — Telephone Encounter (Signed)
-----   Message from [name] [name], MD sent at 04/10/2017  3:28 PM EST ----- Regarding: RE: Increase exercise target heart rate OK to increase HR to 95%.  Peter [name] MD, [facility]  ----- Message ----- From: Sol Passer Sent: 04/10/2017   9:16 AM To: [name] [name], MD Subject: Increase exercise target heart rate            Dear Dr. [name],   Your patient Alan Mcguire has been in exercising in the cardiac rehab program for 4.5 weeks and has been doing great. Patient has begun to exceed his current THR of 64-128 (40-80% age predicted target) and we would like to increase it to 152 max (95 %). If you are agreeable to this change in exercise prescription please let us know.   Thanks so much for your help! Sol Passer, MS, ACSM CEP

## 2017-04-12 ENCOUNTER — Encounter (HOSPITAL_COMMUNITY)
Admission: RE | Admit: 2017-04-12 | Discharge: 2017-04-12 | Disposition: A | Discharge status: Home / Self Care | Payer: Managed Care, Other (non HMO) | Source: Ambulatory Visit | Attending: Cardiology | Admitting: Cardiology

## 2017-04-12 DIAGNOSIS — Z9889 Other specified postprocedural states: Secondary | ICD-10-CM

## 2017-04-13 ENCOUNTER — Ambulatory Visit (INDEPENDENT_AMBULATORY_CARE_PROVIDER_SITE_OTHER): Payer: Managed Care, Other (non HMO) | Admitting: *Deleted

## 2017-04-13 DIAGNOSIS — Z5181 Encounter for therapeutic drug level monitoring: Secondary | ICD-10-CM | POA: Diagnosis not present

## 2017-04-13 DIAGNOSIS — Z9889 Other specified postprocedural states: Secondary | ICD-10-CM | POA: Diagnosis not present

## 2017-04-13 LAB — POCT INR: INR: 2.2

## 2017-04-13 NOTE — Patient Instructions (Signed)
Description   Continue taking 1 tablet (5mg ) daily except 1/2 tablet (2.5mg ) on Mondays. Recheck INR in 2 weeks. Call with any new medications any new scheduled procedures or with any questions or concerns [phone]

## 2017-04-14 ENCOUNTER — Encounter (HOSPITAL_COMMUNITY)
Admission: RE | Admit: 2017-04-14 | Discharge: 2017-04-14 | Disposition: A | Discharge status: Home / Self Care | Payer: Managed Care, Other (non HMO) | Source: Ambulatory Visit | Attending: Cardiology | Admitting: Cardiology

## 2017-04-14 DIAGNOSIS — Z9889 Other specified postprocedural states: Secondary | ICD-10-CM | POA: Insufficient documentation

## 2017-04-17 ENCOUNTER — Encounter (HOSPITAL_COMMUNITY)
Admission: RE | Admit: 2017-04-17 | Discharge: 2017-04-17 | Disposition: A | Discharge status: Home / Self Care | Payer: Managed Care, Other (non HMO) | Source: Ambulatory Visit | Attending: Cardiology | Admitting: Cardiology

## 2017-04-17 DIAGNOSIS — Z9889 Other specified postprocedural states: Secondary | ICD-10-CM

## 2017-04-19 ENCOUNTER — Encounter (HOSPITAL_COMMUNITY)
Admission: RE | Admit: 2017-04-19 | Discharge: 2017-04-19 | Disposition: A | Discharge status: Home / Self Care | Payer: Managed Care, Other (non HMO) | Source: Ambulatory Visit | Attending: Cardiology | Admitting: Cardiology

## 2017-04-19 DIAGNOSIS — Z9889 Other specified postprocedural states: Secondary | ICD-10-CM

## 2017-04-21 ENCOUNTER — Encounter (HOSPITAL_COMMUNITY)
Admission: RE | Admit: 2017-04-21 | Discharge: 2017-04-21 | Disposition: A | Discharge status: Home / Self Care | Payer: Managed Care, Other (non HMO) | Source: Ambulatory Visit | Attending: Cardiology | Admitting: Cardiology

## 2017-04-21 DIAGNOSIS — Z9889 Other specified postprocedural states: Secondary | ICD-10-CM | POA: Diagnosis not present

## 2017-04-24 ENCOUNTER — Encounter (HOSPITAL_COMMUNITY)
Admission: RE | Admit: 2017-04-24 | Discharge: 2017-04-24 | Disposition: A | Discharge status: Home / Self Care | Payer: Managed Care, Other (non HMO) | Source: Ambulatory Visit | Attending: Cardiology | Admitting: Cardiology

## 2017-04-24 DIAGNOSIS — Z9889 Other specified postprocedural states: Secondary | ICD-10-CM | POA: Diagnosis not present

## 2017-04-26 ENCOUNTER — Encounter (HOSPITAL_COMMUNITY): Payer: Managed Care, Other (non HMO)

## 2017-04-26 ENCOUNTER — Telehealth (HOSPITAL_COMMUNITY): Payer: Self-pay | Admitting: Adult Health

## 2017-04-27 ENCOUNTER — Ambulatory Visit (INDEPENDENT_AMBULATORY_CARE_PROVIDER_SITE_OTHER): Payer: Managed Care, Other (non HMO) | Admitting: *Deleted

## 2017-04-27 DIAGNOSIS — Z5181 Encounter for therapeutic drug level monitoring: Secondary | ICD-10-CM

## 2017-04-27 DIAGNOSIS — Z9889 Other specified postprocedural states: Secondary | ICD-10-CM

## 2017-04-27 LAB — POCT INR: INR: 1.4

## 2017-04-27 NOTE — Patient Instructions (Signed)
Description   Today and tomorrow take 1.5 tablets then continue taking 1 tablet (5mg ) daily except 1/2 tablet (2.5mg ) on Mondays. Recheck INR in 1 week. Call with any new medications any new scheduled procedures or with any questions or concerns [phone]

## 2017-04-28 ENCOUNTER — Encounter (HOSPITAL_COMMUNITY)
Admission: RE | Admit: 2017-04-28 | Discharge: 2017-04-28 | Disposition: A | Discharge status: Home / Self Care | Payer: Managed Care, Other (non HMO) | Source: Ambulatory Visit | Attending: Cardiology | Admitting: Cardiology

## 2017-04-28 DIAGNOSIS — Z9889 Other specified postprocedural states: Secondary | ICD-10-CM

## 2017-05-01 ENCOUNTER — Encounter (HOSPITAL_COMMUNITY)
Admission: RE | Admit: 2017-05-01 | Discharge: 2017-05-01 | Disposition: A | Discharge status: Home / Self Care | Payer: Managed Care, Other (non HMO) | Source: Ambulatory Visit | Attending: Cardiology | Admitting: Cardiology

## 2017-05-01 DIAGNOSIS — Z9889 Other specified postprocedural states: Secondary | ICD-10-CM

## 2017-05-03 ENCOUNTER — Encounter (HOSPITAL_COMMUNITY)
Admission: RE | Admit: 2017-05-03 | Discharge: 2017-05-03 | Disposition: A | Discharge status: Home / Self Care | Payer: Managed Care, Other (non HMO) | Source: Ambulatory Visit | Attending: Cardiology | Admitting: Cardiology

## 2017-05-03 DIAGNOSIS — Z9889 Other specified postprocedural states: Secondary | ICD-10-CM

## 2017-05-04 ENCOUNTER — Ambulatory Visit (INDEPENDENT_AMBULATORY_CARE_PROVIDER_SITE_OTHER): Payer: Managed Care, Other (non HMO) | Admitting: *Deleted

## 2017-05-04 DIAGNOSIS — Z5181 Encounter for therapeutic drug level monitoring: Secondary | ICD-10-CM

## 2017-05-04 DIAGNOSIS — Z9889 Other specified postprocedural states: Secondary | ICD-10-CM | POA: Diagnosis not present

## 2017-05-04 LAB — POCT INR: INR: 1.7

## 2017-05-04 MED ORDER — WARFARIN SODIUM 5 MG PO TABS: ORAL_TABLET | ORAL | 2 refills | Status: DC

## 2017-05-04 NOTE — Patient Instructions (Signed)
Description   Today take 1.5 tablets then start taking 1 tablet (5mg ) daily. Recheck INR in 1 week. Call with any new medications any new scheduled procedures or with any questions or concerns [phone]

## 2017-05-04 NOTE — Progress Notes (Signed)
Cardiac Individual Treatment Plan  Patient Details  Name: Alan Mcguire MRN: [id number] Date of Birth: 05-09-1955 Referring Provider:     CARDIAC REHAB PHASE II ORIENTATION from 03/09/2017 in [place]  Referring Provider  [name], [name], MD      Initial Encounter Date:    CARDIAC REHAB PHASE II ORIENTATION from 03/09/2017 in [place]  Date  03/09/17  Referring Provider  [name], [name], MD      Visit Diagnosis: S/P mitral valve repair  Patient's Home Medications on Admission:  Current Outpatient Medications:  .  acetaminophen (TYLENOL) 500 MG tablet, Take 2 tablets (1,000 mg total) every 6 (six) hours by mouth. (Patient not taking: Reported on 03/09/2017), Disp: 30 tablet, Rfl: 0 .  amoxicillin (AMOXIL) 500 MG tablet, Take all 4 tablets one hour prior to dental procedure, Disp: 4 tablet, Rfl: 6 .  metoprolol succinate (TOPROL XL) 25 MG 24 hr tablet, Take 1 tablet (25 mg total) by mouth daily., Disp: 30 tablet, Rfl: 3 .  Multiple Vitamin (MULTIVITAMIN) capsule, Take 1 capsule by mouth daily., Disp: , Rfl:  .  warfarin (COUMADIN) 5 MG tablet, Take 1/2 tablet to 1 tablet by mouth daily or as directed by coumadin clinic, Disp: 35 tablet, Rfl: 2  Past Medical History: Past Medical History:  Diagnosis Date  . Chronic lower back pain   . Lumbar disc disease    L4-5  . Murmur 9/92011   echo-MVP posterior leaflet with moderate mitral insufficiency and the jet directed anteriorly,left atrium mildly enlarged  . MVP (mitral valve prolapse)   . Non-rheumatic mitral regurgitation   . Pneumonia 1990s   hx walking pneumonia  . 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   Complex valvuloplasty including triangular resection of flail segment of posterior leaflet, artificial Gore-tex neochord placement x4 and 34 mm Sorin Memo 3D ring annuloplasty via right mini thoracotomy approach    Tobacco Use: Social  History   Tobacco Use  Smoking Status Never Smoker  Smokeless Tobacco Never Used    Labs: Recent Review Flowsheet Data    Labs for ITP Cardiac and Pulmonary Rehab Latest Ref Rng & Units 01/24/2017 01/24/2017 01/24/2017 01/24/2017 01/25/2017   Cholestrol 0 - 200 mg/dL - - - - -   LDLCALC 0 - 99 mg/dL - - - - -   LDLDIRECT mg/dL - - - - -   HDL >39.00 mg/dL - - - - -   Trlycerides 0.0 - 149.0 mg/dL - - - - -   Hemoglobin A1c 4.8 - 5.6 % - - - - -   PHART 7.350 - 7.450 7.284(L) 7.383 7.328(L) - 7.374   PCO2ART 32.0 - 48.0 mmHg 49.7(H) 41.8 45.4 - 42.1   HCO3 20.0 - 28.0 mmol/L 24.1 25.0 23.9 - 24.0   TCO2 22 - 32 mmol/L 26 26 25 24  20(L)   ACIDBASEDEF 0.0 - 2.0 mmol/L 4.0(H) - 2.0 - 0.5   O2SAT % 96.0 99.0 98.0 - 97.6      Capillary Blood Glucose: Lab Results  Component Value Date   GLUCAP 110 (H) 01/26/2017   GLUCAP 105 (H) 01/25/2017   GLUCAP 136 (H) 01/25/2017   GLUCAP 146 (H) 01/25/2017   GLUCAP 110 (H) 01/25/2017     Exercise Target Goals:    Exercise Program Goal: Individual exercise prescription set using results from initial 6 min walk test and THRR while considering  patient's activity barriers and safety.  Exercise Prescription Goal: Initial exercise prescription builds to 30-45 minutes a day of aerobic activity, 2-3 days per week.  Home exercise guidelines will be given to patient during program as part of exercise prescription that the participant will acknowledge.  Activity Barriers & Risk Stratification: Activity Barriers & Cardiac Risk Stratification - 03/09/17 1125      Activity Barriers & Cardiac Risk Stratification   Activity Barriers  Incisional Pain    Cardiac Risk Stratification  High       6 Minute Walk: 6 Minute Walk    Row Name 03/09/17 1105         6 Minute Walk   Phase  Initial     Distance  1929 feet     Walk Time  6 minutes     # of Rest Breaks  0     MPH  3.65     METS  4.82     RPE  11     Perceived Dyspnea   0     VO2  Peak  16.85     Symptoms  No     Resting HR  91 bpm     Resting BP  102/70     Resting Oxygen Saturation   98 %     Exercise Oxygen Saturation  during 6 min walk  98 %     Max Ex. HR  109 bpm     Max Ex. BP  122/80     2 Minute Post BP  104/82        Oxygen Initial Assessment:   Oxygen Re-Evaluation:   Oxygen Discharge (Final Oxygen Re-Evaluation):   Initial Exercise Prescription: Initial Exercise Prescription - 03/09/17 1100      Date of Initial Exercise RX and Referring Provider   Date  03/09/17    Referring Provider  [name], [name], MD      Treadmill   MPH  3.3    Grade  1    Minutes  10    METs  3.98      Bike   Level  1.3    Minutes  10    METs  4.07      NuStep   Level  3    SPM  70    Minutes  10    METs  3.5      Prescription Details   Frequency (times per week)  3x    Duration  Progress to 30 minutes of continuous aerobic without signs/symptoms of physical distress      Intensity   THRR 40-80% of Max Heartrate  64-128    Ratings of Perceived Exertion  11-13    Perceived Dyspnea  0-4      Progression   Progression  Continue progressive overload as per policy without signs/symptoms or physical distress.      Resistance Training   Training Prescription  Yes    Weight  2lbs    Reps  10-15       Perform Capillary Blood Glucose checks as needed.  Exercise Prescription Changes: Exercise Prescription Changes    Row Name 03/15/17 0657 03/16/17 0600 03/27/17 1447 04/12/17 1511 04/24/17 1449     Response to Exercise   Blood Pressure (Admit)  118/70  -  120/80  122/82  118/72   Blood Pressure (Exercise)  138/78  -  162/80  132/80  138/82   Blood Pressure (Exit)  100/60  -  104/70  120/83  102/80  Heart Rate (Admit)  88 bpm  -  86 bpm  88 bpm  90 bpm   Heart Rate (Exercise)  120 bpm  -  120 bpm  135 bpm  145 bpm   Heart Rate (Exit)  95 bpm  -  85 bpm  95 bpm  89 bpm   Rating of Perceived Exertion (Exercise)  -  -  14  13  13    Symptoms  None   -  None  None  None   Comments  Pt oriented to exercise equipment  -  -  -  -   Duration  Progress to 30 minutes of  aerobic without signs/symptoms of physical distress  -  Progress to 30 minutes of  aerobic without signs/symptoms of physical distress  Continue with 30 min of aerobic exercise without signs/symptoms of physical distress.  Continue with 30 min of aerobic exercise without signs/symptoms of physical distress.   Intensity  THRR unchanged  -  THRR unchanged  THRR New Clearance to increase THRR to 152 bpm 95% age-predicted max.  THRR unchanged     Progression   Progression  Continue to progress workloads to maintain intensity without signs/symptoms of physical distress.  -  Continue to progress workloads to maintain intensity without signs/symptoms of physical distress.  Continue to progress workloads to maintain intensity without signs/symptoms of physical distress.  Continue to progress workloads to maintain intensity without signs/symptoms of physical distress.   Average METs  3.34  -  4.9  5.7  6.6     Resistance Training   Training Prescription  No  -  Yes  No Relaxation today, not   Yes   Weight  -  -  4lbs  -  5lbs   Reps  -  -  10-15  -  10-15   Time  -  -  10 Minutes  -  10 Minutes     Interval Training   Interval Training  No  -  No  -  No     Treadmill   MPH  3.3  -  3.3  3.3  3.3   Grade  1  -  3  3  3    Minutes  10  -  10  10  10    METs  3.98  -  4.89  4.89  4.89     Bike   Level  1.3  -  -  -  -   Minutes  10  -  -  -  -   METs  4.07  -  -  -  -     Recumbant Bike   Level  -  -  4 SciFit Bike  4 SciFit Bike  5 SciFit Bike   Minutes  -  -  10  10  10    METs  -  -  -  7.4  8     NuStep   Level  3  -  6  6  6    SPM  70  -  85  85  95   Minutes  10  -  10  10  10    METs  2.7  -  4.9  4.9  6.9     Home Exercise Plan   Plans to continue exercise at  -  -  Home (comment)  Home (comment)  Home (comment)   Frequency  -  -  Add 4 additional days to program  exercise  sessions.  Add 4 additional days to program exercise sessions.  Add 4 additional days to program exercise sessions.   Initial Home Exercises Provided  -  -  03/27/17  03/27/17  03/27/17      Exercise Comments: Exercise Comments    Row Name 03/15/17 0659 03/27/17 1507 04/12/17 1511 04/24/17 1448     Exercise Comments  Pt oriented to exercise equipment. Pt tolerated exercise well. Will continue to monitor pt.   Reviewed home exercise guidelines, METs and goals with patient.  Reviewed METs with patient. Received clearance from cardiologist to increase patient's target heart rate range up to 152bpm.  Reviewed METs and goals with patient.        Exercise Goals and Review: Exercise Goals    Row Name 03/09/17 1104             Exercise Goals   Increase Physical Activity  Yes       Intervention  Provide advice, education, support and counseling about physical activity/exercise needs.;Develop an individualized exercise prescription for aerobic and resistive training based on initial evaluation findings, risk stratification, comorbidities and participant's personal goals.       Expected Outcomes  Achievement of increased cardiorespiratory fitness and enhanced flexibility, muscular endurance and strength shown through measurements of functional capacity and personal statement of participant.       Increase Strength and Stamina  Yes       Intervention  Provide advice, education, support and counseling about physical activity/exercise needs.;Develop an individualized exercise prescription for aerobic and resistive training based on initial evaluation findings, risk stratification, comorbidities and participant's personal goals.       Expected Outcomes  Achievement of increased cardiorespiratory fitness and enhanced flexibility, muscular endurance and strength shown through measurements of functional capacity and personal statement of participant.       Able to understand and use rate of  perceived exertion (RPE) scale  Yes       Intervention  Provide education and explanation on how to use RPE scale       Expected Outcomes  Short Term: Able to use RPE daily in rehab to express subjective intensity level;Long Term:  Able to use RPE to guide intensity level when exercising independently       Knowledge and understanding of Target Heart Rate Range (THRR)  Yes       Intervention  Provide education and explanation of THRR including how the numbers were predicted and where they are located for reference       Expected Outcomes  Short Term: Able to use daily as guideline for intensity in rehab;Long Term: Able to use THRR to govern intensity when exercising independently;Short Term: Able to state/look up THRR       Able to check pulse independently  Yes       Intervention  Provide education and demonstration on how to check pulse in carotid and radial arteries.;Review the importance of being able to check your own pulse for safety during independent exercise       Expected Outcomes  Short Term: Able to explain why pulse checking is important during independent exercise;Long Term: Able to check pulse independently and accurately       Understanding of Exercise Prescription  Yes       Intervention  Provide education, explanation, and written materials on patient's individual exercise prescription       Expected Outcomes  Short Term: Able to explain program exercise prescription;Long Term: Able to explain home exercise  prescription to exercise independently          Exercise Goals Re-Evaluation : Exercise Goals Re-Evaluation    [place] Name 03/27/17 1716 04/24/17 1448           Exercise Goal Re-Evaluation   Exercise Goals Review  Increase Physical Activity;Understanding of Exercise Prescription;Able to understand and use rate of perceived exertion (RPE) scale;Knowledge and understanding of Target Heart Rate Range (THRR)  Increase Physical Activity      Comments  Reviewed home exercise  guidelines with patient including THRR, RPE scale and endpoints for exercise. Pt is currently walking 20 minutes daily in addition to exercise at [facility].  Continues to progress well with exercise. Walking 20 minutes 4 days/week in addition to exercise at cardiac rehab.      Expected Outcomes  Increase exercise duration at home from 20 minutes to 30 minutes daily.  Continue daily exercise to help achieve goal of getting back to swimming when cleared and the season begins.          Discharge Exercise Prescription (Final Exercise Prescription Changes): Exercise Prescription Changes - 04/24/17 1449      Response to Exercise   Blood Pressure (Admit)  118/72    Blood Pressure (Exercise)  138/82    Blood Pressure (Exit)  102/80    Heart Rate (Admit)  90 bpm    Heart Rate (Exercise)  145 bpm    Heart Rate (Exit)  89 bpm    Rating of Perceived Exertion (Exercise)  13    Symptoms  None    Duration  Continue with 30 min of aerobic exercise without signs/symptoms of physical distress.    Intensity  THRR unchanged      Progression   Progression  Continue to progress workloads to maintain intensity without signs/symptoms of physical distress.    Average METs  6.6      Resistance Training   Training Prescription  Yes    Weight  5lbs    Reps  10-15    Time  10 Minutes      Interval Training   Interval Training  No      Treadmill   MPH  3.3    Grade  3    Minutes  10    METs  4.89      Recumbant Bike   Level  5 SciFit Bike    Minutes  10    METs  8      NuStep   Level  6    SPM  95    Minutes  10    METs  6.9      Home Exercise Plan   Plans to continue exercise at  Home (comment)    Frequency  Add 4 additional days to program exercise sessions.    Initial Home Exercises Provided  03/27/17       Nutrition:  Target Goals: Understanding of nutrition guidelines, daily intake of sodium 1500mg , cholesterol 200mg , calories 30% from fat and 7% or less from saturated fats, daily to  have 5 or more servings of fruits and vegetables.  Biometrics: Pre Biometrics - 03/09/17 1102      Pre Biometrics   Height  5\' 11"  (1.803 m)    Weight  175 lb 11.3 oz (79.7 kg)    Waist Circumference  36.5 inches    Hip Circumference  37 inches    Waist to Hip Ratio  0.99 %    BMI (Calculated)  24.[place]  Triceps Skinfold  12 mm    % Body Fat  23.3 %    Grip Strength  52 kg    Flexibility  10 in    Single Leg Stand  30 seconds        Nutrition Therapy Plan and Nutrition Goals: Nutrition Therapy & Goals - 03/09/17 1506      Nutrition Therapy   Diet  Heart Healthy      Personal Nutrition Goals   Nutrition Goal  Pt to identify and limit food sources of saturated fat, trans fat, and sodium      Intervention Plan   Intervention  Prescribe, educate and counsel regarding individualized specific dietary modifications aiming towards targeted core components such as weight, hypertension, lipid management, diabetes, heart failure and other comorbidities.    Expected Outcomes  Short Term Goal: Understand basic principles of dietary content, such as calories, fat, sodium, cholesterol and nutrients.;Long Term Goal: Adherence to prescribed nutrition plan.       Nutrition Assessments: Nutrition Assessments - 03/09/17 1506      MEDFICTS Scores   Pre Score  59       Nutrition Goals Re-Evaluation:   Nutrition Goals Re-Evaluation:   Nutrition Goals Discharge (Final Nutrition Goals Re-Evaluation):   Psychosocial: Target Goals: Acknowledge presence or absence of significant depression and/or stress, maximize coping skills, provide positive support system. Participant is able to verbalize types and ability to use techniques and skills needed for reducing stress and depression.  Initial Review & Psychosocial Screening: Initial Psych Review & Screening - 03/09/17 1141      Initial Review   Current issues with  None Identified      Family Dynamics   Good Support System?  Yes pt  wife is a Therapist, sports at [employer].  Pt feels supported in his recuperation.      Barriers   Psychosocial barriers to participate in program  There are no identifiable barriers or psychosocial needs.      Screening Interventions   Interventions  Encouraged to exercise       Quality of Life Scores: Quality of Life - 03/09/17 1102      Quality of Life Scores   Health/Function Pre  27.6 %    Socioeconomic Pre  30 %    Psych/Spiritual Pre  29.14 %    Family Pre  30 %    GLOBAL Pre  28.76 %      Scores of 19 and below usually indicate a poorer quality of life in these areas.  A difference of  2-3 points is a clinically meaningful difference.  A difference of 2-3 points in the total score of the Quality of Life Index has been associated with significant improvement in overall quality of life, self-image, physical symptoms, and general health in studies assessing change in quality of life.  PHQ-9: Recent Review Flowsheet Data    Depression screen [facility] 2/9 03/15/2017   Decreased Interest 0   Down, Depressed, Hopeless 0   PHQ - 2 Score 0     Interpretation of Total Score  Total Score Depression Severity:  1-4 = Minimal depression, 5-9 = Mild depression, 10-14 = Moderate depression, 15-19 = Moderately severe depression, 20-27 = Severe depression   Psychosocial Evaluation and Intervention:   Psychosocial Re-Evaluation: Psychosocial Re-Evaluation    [place] Name 04/05/17 1714 05/04/17 1530           Psychosocial Re-Evaluation   Current issues with  None Identified  None Identified  Interventions  Encouraged to attend Cardiac Rehabilitation for the exercise  Encouraged to attend Cardiac Rehabilitation for the exercise      Continue Psychosocial Services   No Follow up required  No Follow up required         Psychosocial Discharge (Final Psychosocial Re-Evaluation): Psychosocial Re-Evaluation - 05/04/17 1530      Psychosocial Re-Evaluation   Current issues with  None Identified     Interventions  Encouraged to attend Cardiac Rehabilitation for the exercise    Continue Psychosocial Services   No Follow up required       Vocational Rehabilitation: Provide vocational rehab assistance to qualifying candidates.   Vocational Rehab Evaluation & Intervention: Vocational Rehab - 03/09/17 1148      Initial Vocational Rehab Evaluation & Intervention   Assessment shows need for Vocational Rehabilitation  -- Pt has returned to work a couple of hours a day at a [employer].       Education: Education Goals: Education classes will be provided on a weekly basis, covering required topics. Participant will state understanding/return demonstration of topics presented.  Learning Barriers/Preferences: Learning Barriers/Preferences - 03/09/17 1108      Learning Barriers/Preferences   Learning Barriers  Sight    Learning Preferences  Written Material;Skilled Demonstration       Education Topics: Count Your Pulse:  -Group instruction provided by verbal instruction, demonstration, patient participation and written materials to support subject.  Instructors address importance of being able to find your pulse and how to count your pulse when at home without a heart monitor.  Patients get hands on experience counting their pulse with staff help and individually.   Heart Attack, Angina, and Risk Factor Modification:  -Group instruction provided by verbal instruction, video, and written materials to support subject.  Instructors address signs and symptoms of angina and heart attacks.    Also discuss risk factors for heart disease and how to make changes to improve heart health risk factors.   Functional Fitness:  -Group instruction provided by verbal instruction, demonstration, patient participation, and written materials to support subject.  Instructors address safety measures for doing things around the house.  Discuss how to get up and down off the floor, how to pick  things up properly, how to safely get out of a chair without assistance, and balance training.   Meditation and Mindfulness:  -Group instruction provided by verbal instruction, patient participation, and written materials to support subject.  Instructor addresses importance of mindfulness and meditation practice to help reduce stress and improve awareness.  Instructor also leads participants through a meditation exercise.    Stretching for Flexibility and Mobility:  -Group instruction provided by verbal instruction, patient participation, and written materials to support subject.  Instructors lead participants through series of stretches that are designed to increase flexibility thus improving mobility.  These stretches are additional exercise for major muscle groups that are typically performed during regular warm up and cool down.   CARDIAC REHAB PHASE II EXERCISE from 05/03/2017 in [place]  Date  03/17/17  Educator  EP      Hands Only CPR:  -Group verbal, video, and participation provides a basic overview of AHA guidelines for community CPR. Role-play of emergencies allow participants the opportunity to practice calling for help and chest compression technique with discussion of AED use.   CARDIAC REHAB PHASE II EXERCISE from 05/03/2017 in [place]  Date  04/14/17  Instruction Review  Code  2- Demonstrated Understanding      Hypertension: -Group verbal and written instruction that provides a basic overview of hypertension including the most recent diagnostic guidelines, risk factor reduction with self-care instructions and medication management.   CARDIAC REHAB PHASE II EXERCISE from 05/03/2017 in [place]  Date  03/24/17       Nutrition I class: Heart Healthy Eating:  -Group instruction provided by PowerPoint slides, verbal discussion, and written materials to support subject matter.  The instructor gives an explanation and review of the Therapeutic Lifestyle Changes diet recommendations, which includes a discussion on lipid goals, dietary fat, sodium, fiber, plant stanol/sterol esters, sugar, and the components of a well-balanced, healthy diet.   Nutrition II class: Lifestyle Skills:  -Group instruction provided by PowerPoint slides, verbal discussion, and written materials to support subject matter. The instructor gives an explanation and review of label reading, grocery shopping for heart health, heart healthy recipe modifications, and ways to make healthier choices when eating out.   Diabetes Question & Answer:  -Group instruction provided by PowerPoint slides, verbal discussion, and written materials to support subject matter. The instructor gives an explanation and review of diabetes co-morbidities, pre- and post-prandial blood glucose goals, pre-exercise blood glucose goals, signs, symptoms, and treatment of hypoglycemia and hyperglycemia, and foot care basics.   Diabetes Blitz:  -Group instruction provided by PowerPoint slides, verbal discussion, and written materials to support subject matter. The instructor gives an explanation and review of the physiology behind type 1 and type 2 diabetes, diabetes medications and rational behind using different medications, pre- and post-prandial blood glucose recommendations and Hemoglobin A1c goals, diabetes diet, and exercise including blood glucose guidelines for exercising safely.    Portion Distortion:  -Group instruction provided by PowerPoint slides, verbal discussion, written materials, and food models to support subject matter. The instructor gives an explanation of serving size versus portion size, changes in portions sizes over the last 20 years, and what consists of a serving from each food group.   CARDIAC REHAB PHASE II EXERCISE from 05/03/2017 in [place]  Date  04/12/17  Educator  RD   Instruction Review Code  2- Demonstrated Understanding      Stress Management:  -Group instruction provided by verbal instruction, video, and written materials to support subject matter.  Instructors review role of stress in heart disease and how to cope with stress positively.     Exercising on Your Own:  -Group instruction provided by verbal instruction, power point, and written materials to support subject.  Instructors discuss benefits of exercise, components of exercise, frequency and intensity of exercise, and end points for exercise.  Also discuss use of nitroglycerin and activating EMS.  Review options of places to exercise outside of rehab.  Review guidelines for sex with heart disease.   CARDIAC REHAB PHASE II EXERCISE from 05/03/2017 in [place]  Date  03/29/17  Educator  EP      Cardiac Drugs I:  -Group instruction provided by verbal instruction and written materials to support subject.  Instructor reviews cardiac drug classes: antiplatelets, anticoagulants, beta blockers, and statins.  Instructor discusses reasons, side effects, and lifestyle considerations for each drug class.   CARDIAC REHAB PHASE II EXERCISE from 05/03/2017 in [place]  Date  03/22/17      Cardiac Drugs II:  -Group instruction provided by verbal instruction and written materials to support subject.  Instructor reviews cardiac drug classes: angiotensin converting enzyme inhibitors (ACE-I), angiotensin II receptor blockers (ARBs), nitrates, and calcium channel blockers.  Instructor discusses reasons, side effects, and lifestyle considerations for each drug class.   Anatomy and Physiology of the Circulatory System:  Group verbal and written instruction and models provide basic cardiac anatomy and physiology, with the coronary electrical and arterial systems. Review of: AMI, Angina, Valve disease, Heart Failure, Peripheral Artery Disease,  Cardiac Arrhythmia, Pacemakers, and the ICD.   CARDIAC REHAB PHASE II EXERCISE from 05/03/2017 in [place]  Date  05/03/17  Instruction Review Code  2- Demonstrated Understanding      Other Education:  -Group or individual verbal, written, or video instructions that support the educational goals of the cardiac rehab program.   Holiday Eating Survival Tips:  -Group instruction provided by PowerPoint slides, verbal discussion, and written materials to support subject matter. The instructor gives patients tips, tricks, and techniques to help them not only survive but enjoy the holidays despite the onslaught of food that accompanies the holidays.   Knowledge Questionnaire Score: Knowledge Questionnaire Score - 03/09/17 1102      Knowledge Questionnaire Score   Pre Score  23/24       Core Components/Risk Factors/Patient Goals at Admission: Personal Goals and Risk Factors at Admission - 03/09/17 1106      Core Components/Risk Factors/Patient Goals on Admission    Weight Management  Yes;Weight Maintenance    Intervention  Weight Management: Develop a combined nutrition and exercise program designed to reach desired caloric intake, while maintaining appropriate intake of nutrient and fiber, sodium and fats, and appropriate energy expenditure required for the weight goal.;Weight Management: Provide education and appropriate resources to help participant work on and attain dietary goals.    Admit Weight  175 lb 11.3 oz (79.7 kg)    Goal Weight: Long Term  170 lb (77.1 kg)    Expected Outcomes  Short Term: Continue to assess and modify interventions until short term weight is achieved;Long Term: Adherence to nutrition and physical activity/exercise program aimed toward attainment of established weight goal;Weight Maintenance: Understanding of the daily nutrition guidelines, which includes 25-35% calories from fat, 7% or less cal from saturated fats, less than  200mg  cholesterol, less than 1.5gm of sodium, & 5 or more servings of fruits and vegetables daily;Understanding recommendations for meals to include 15-35% energy as protein, 25-35% energy from fat, 35-60% energy from carbohydrates, less than 200mg  of dietary cholesterol, 20-35 gm of total fiber daily;Understanding of distribution of calorie intake throughout the day with the consumption of 4-5 meals/snacks    Hypertension  Yes    Intervention  Provide education on lifestyle modifcations including regular physical activity/exercise, weight management, moderate sodium restriction and increased consumption of fresh fruit, vegetables, and low fat dairy, alcohol moderation, and smoking cessation.;Monitor prescription use compliance.    Expected Outcomes  Short Term: Continued assessment and intervention until BP is < 140/71mm HG in hypertensive participants. < 130/49mm HG in hypertensive participants with diabetes, heart failure or chronic kidney disease.;Long Term: Maintenance of blood pressure at goal levels.       Core Components/Risk Factors/Patient Goals Review:  Goals and Risk Factor Review    Row Name 04/05/17 1711 05/04/17 1530           Core Components/Risk Factors/Patient Goals Review   Personal Goals Review  Weight Management/Obesity;Hypertension  Weight Management/Obesity;Hypertension      Review  Angie's vital signs have been stable at  cardiac rehab. Angie's weights have been stable.  Angie's vital signs have been stable at cardiac rehab. Angie's weights have been stable.      Expected Outcomes  Kyair will continue to take her medications as presribed and come to cardiac rehab to exercise.  Jedediah will continue to take her medications as presribed and come to cardiac rehab to exercise.         Core Components/Risk Factors/Patient Goals at Discharge (Final Review):  Goals and Risk Factor Review - 05/04/17 1530      Core Components/Risk Factors/Patient Goals Review   Personal Goals  Review  Weight Management/Obesity;Hypertension    Review  Angie's vital signs have been stable at cardiac rehab. Angie's weights have been stable.    Expected Outcomes  Farrell will continue to take her medications as presribed and come to cardiac rehab to exercise.       ITP Comments: ITP Comments    Row Name 03/09/17 1941 04/05/17 1710 05/04/17 1530       ITP Comments  Dr. Fransico Him, Medical Director   30 day ITP review. Patient has good attendance and participation in phase 2 cardiac rehab  30 day ITP review. Patient has good attendance and participation in phase 2 cardiac rehab        Comments: See ITP comments.Barnet Pall, RN,BSN 05/04/2017 3:32 PM

## 2017-05-05 ENCOUNTER — Encounter (HOSPITAL_COMMUNITY)
Admission: RE | Admit: 2017-05-05 | Discharge: 2017-05-05 | Disposition: A | Discharge status: Home / Self Care | Payer: Managed Care, Other (non HMO) | Source: Ambulatory Visit | Attending: Cardiology | Admitting: Cardiology

## 2017-05-05 ENCOUNTER — Other Ambulatory Visit: Payer: Self-pay | Admitting: Cardiology

## 2017-05-05 DIAGNOSIS — Z9889 Other specified postprocedural states: Secondary | ICD-10-CM

## 2017-05-08 ENCOUNTER — Encounter (HOSPITAL_COMMUNITY): Payer: Managed Care, Other (non HMO)

## 2017-05-08 ENCOUNTER — Telehealth (HOSPITAL_COMMUNITY): Payer: Self-pay | Admitting: Adult Health

## 2017-05-10 ENCOUNTER — Encounter (HOSPITAL_COMMUNITY)
Admission: RE | Admit: 2017-05-10 | Discharge: 2017-05-10 | Disposition: A | Discharge status: Home / Self Care | Payer: Managed Care, Other (non HMO) | Source: Ambulatory Visit | Attending: Cardiology | Admitting: Cardiology

## 2017-05-10 VITALS — BP 112/82 | HR 87 | Ht 71.0 in | Wt 179.9 lb

## 2017-05-10 DIAGNOSIS — Z9889 Other specified postprocedural states: Secondary | ICD-10-CM

## 2017-05-10 NOTE — Progress Notes (Signed)
Discharge Progress Report  Patient Details  Name: Alan Mcguire MRN: [id number] Date of Birth: 09-27-1955 Referring Provider:     CARDIAC REHAB [facility] from 03/09/2017 in [place]  Referring Provider  [name], [name], MD       Number of Visits: 22  Reason for Discharge:  Patient independent in their exercise.  Smoking History:  Social History   Tobacco Use  Smoking Status Never Smoker  Smokeless Tobacco Never Used    Diagnosis:  S/P mitral valve repair  ADL [facility]:   Initial Exercise Prescription: Initial Exercise Prescription - 03/09/17 1100      Date of Initial Exercise RX and Referring Provider   Date  03/09/17    Referring Provider  [name], [name], MD      Treadmill   MPH  3.3    Grade  1    Minutes  10    METs  3.98      Bike   Level  1.3    Minutes  10    METs  4.07      NuStep   Level  3    SPM  70    Minutes  10    METs  3.5      Prescription Details   Frequency (times per week)  3x    Duration  Progress to 30 minutes of continuous aerobic without signs/symptoms of physical distress      Intensity   THRR 40-80% of Max Heartrate  64-128    Ratings of Perceived Exertion  11-13    Perceived Dyspnea  0-4      Progression   Progression  Continue progressive overload as per policy without signs/symptoms or physical distress.      Resistance Training   Training Prescription  Yes    Weight  2lbs    Reps  10-15       Discharge Exercise Prescription (Final Exercise Prescription Changes): Exercise Prescription Changes - 05/10/17 1600      Response to Exercise   Blood Pressure (Admit)  112/82    Blood Pressure (Exercise)  150/82    Blood Pressure (Exit)  114/78    Heart Rate (Admit)  87 bpm    Heart Rate (Exercise)  143 bpm    Heart Rate (Exit)  93 bpm    Rating of Perceived Exertion (Exercise)  13    Symptoms  None    Duration  Continue with 30 min of aerobic exercise without  signs/symptoms of physical distress.    Intensity  THRR unchanged      Progression   Progression  Continue to progress workloads to maintain intensity without signs/symptoms of physical distress.    Average METs  5.6      Resistance Training   Training Prescription  No Relaxation day, no wts.      Interval Training   Interval Training  No      Treadmill   MPH  3.3    Grade  3    Minutes  10    METs  4.89      Bike   Level  1.7    Minutes  10    METs  4.9      Recumbant Bike   Level  --    Minutes  --    METs  --      NuStep   Level  6    SPM  132    Minutes  10    METs  6.8      Track   Laps  11.5 Walk test 2282ft    Minutes  6    METs  4.31      Home Exercise Plan   Plans to continue exercise at  Home (comment)    Frequency  Add 4 additional days to program exercise sessions.    Initial Home Exercises Provided  03/27/17       Functional Capacity: 6 Minute Walk    Row Name 03/09/17 1105 05/10/17 1502       6 Minute Walk   Phase  Initial  Discharge    Distance  1929 feet  2282 feet    Distance % Change  -  18.3 %    Walk Time  6 minutes  6 minutes    # of Rest Breaks  0  0    MPH  3.65  4.32    METS  4.82  5.72    RPE  11  11    Perceived Dyspnea   0  0    VO2 Peak  16.85  20.02    Symptoms  No  No    Resting HR  91 bpm  87 bpm    Resting BP  102/70  112/82    Resting Oxygen Saturation   98 %  -    Exercise Oxygen Saturation  during 6 min walk  98 %  -    Max Ex. HR  109 bpm  118 bpm    Max Ex. BP  122/80  150/82    2 Minute Post BP  104/82  114/78       Psychological, QOL, Others - Outcomes: PHQ 2/9: Depression screen PHQ 2/9 05/10/2017 03/15/2017  Decreased Interest 0 0  Down, Depressed, Hopeless 0 0  PHQ - 2 Score 0 0    Quality of Life: Quality of Life - 05/10/17 1737      Quality of Life Scores   Health/Function Pre  27.6 %    Health/Function Post  29.5 %    Health/Function % Change  6.88 %    Socioeconomic Pre  30 %     Socioeconomic Post  28.79 %    Socioeconomic % Change   -4.03 %    Psych/Spiritual Pre  29.14 %    Psych/Spiritual Post  29.14 %    Psych/Spiritual % Change  0 %    Family Pre  30 %    Family Post  30 %    Family % Change  0 %    GLOBAL Pre  28.76 %    GLOBAL Post  29.35 %    GLOBAL % Change  2.05 %       Personal Goals: Goals established at orientation with interventions provided to work toward goal. Personal Goals and Risk Factors at Admission - 03/09/17 1106      Core Components/Risk Factors/Patient Goals on Admission    Weight Management  Yes;Weight Maintenance    Intervention  Weight Management: Develop a combined nutrition and exercise program designed to reach desired caloric intake, while maintaining appropriate intake of nutrient and fiber, sodium and fats, and appropriate energy expenditure required for the weight goal.;Weight Management: Provide education and appropriate resources to help participant work on and attain dietary goals.    Admit Weight  175 lb 11.3 oz (79.7 kg)    Goal Weight: Long Term  170 lb (77.1 kg)    Expected Outcomes    Short Term: Continue to assess and modify interventions until short term weight is achieved;Long Term: Adherence to nutrition and physical activity/exercise program aimed toward attainment of established weight goal;Weight Maintenance: Understanding of the daily nutrition guidelines, which includes 25-35% calories from fat, 7% or less cal from saturated fats, less than 200mg cholesterol, less than 1.5gm of sodium, & 5 or more servings of fruits and vegetables daily;Understanding recommendations for meals to include 15-35% energy as protein, 25-35% energy from fat, 35-60% energy from carbohydrates, less than 200mg of dietary cholesterol, 20-35 gm of total fiber daily;Understanding of distribution of calorie intake throughout the day with the consumption of 4-5 meals/snacks    Hypertension  Yes    Intervention  Provide education on lifestyle  modifcations including regular physical activity/exercise, weight management, moderate sodium restriction and increased consumption of fresh fruit, vegetables, and low fat dairy, alcohol moderation, and smoking cessation.;Monitor prescription use compliance.    Expected Outcomes  Short Term: Continued assessment and intervention until BP is < 140/90mm HG in hypertensive participants. < 130/80mm HG in hypertensive participants with diabetes, heart failure or chronic kidney disease.;Long Term: Maintenance of blood pressure at goal levels.        Personal Goals Discharge: Goals and Risk Factor Review    Row Name 04/05/17 1711 05/04/17 1530 05/10/17 1448         Core Components/Risk Factors/Patient Goals Review   Personal Goals Review  Weight Management/Obesity;Hypertension  Weight Management/Obesity;Hypertension  Weight Management/Obesity;Hypertension     Review  Alan Mcguire's vital signs have been stable at cardiac rehab. Alan Mcguire's weights have been stable.  Alan Mcguire's vital signs have been stable at cardiac rehab. Alan Mcguire's weights have been stable.  Alan Mcguire's vital signs have been stable at cardiac rehab. Alan Mcguire's weights have been stable.     Expected Outcomes  Alan Mcguire will continue to take her medications as presribed and come to cardiac rehab to exercise.  Alan Mcguire will continue to take her medications as presribed and come to cardiac rehab to exercise.  Alan Mcguire will continue to take his medications as presribed and continue exercise upon d/c from cardiac rehab        Exercise Goals and Review: Exercise Goals    Row Name 03/09/17 1104             Exercise Goals   Increase Physical Activity  Yes       Intervention  Provide advice, education, support and counseling about physical activity/exercise needs.;Develop an individualized exercise prescription for aerobic and resistive training based on initial evaluation findings, risk stratification, comorbidities and participant's personal goals.       Expected  Outcomes  Achievement of increased cardiorespiratory fitness and enhanced flexibility, muscular endurance and strength shown through measurements of functional capacity and personal statement of participant.       Increase Strength and Stamina  Yes       Intervention  Provide advice, education, support and counseling about physical activity/exercise needs.;Develop an individualized exercise prescription for aerobic and resistive training based on initial evaluation findings, risk stratification, comorbidities and participant's personal goals.       Expected Outcomes  Achievement of increased cardiorespiratory fitness and enhanced flexibility, muscular endurance and strength shown through measurements of functional capacity and personal statement of participant.       Able to understand and use rate of perceived exertion (RPE) scale  Yes       Intervention  Provide education and explanation on how to use RPE scale       Expected   Outcomes  Short Term: Able to use RPE daily in rehab to express subjective intensity level;Long Term:  Able to use RPE to guide intensity level when exercising independently       Knowledge and understanding of Target Heart Rate Range (THRR)  Yes       Intervention  Provide education and explanation of THRR including how the numbers were predicted and where they are located for reference       Expected Outcomes  Short Term: Able to use daily as guideline for intensity in rehab;Long Term: Able to use THRR to govern intensity when exercising independently;Short Term: Able to state/look up THRR       Able to check pulse independently  Yes       Intervention  Provide education and demonstration on how to check pulse in carotid and radial arteries.;Review the importance of being able to check your own pulse for safety during independent exercise       Expected Outcomes  Short Term: Able to explain why pulse checking is important during independent exercise;Long Term: Able to check  pulse independently and accurately       Understanding of Exercise Prescription  Yes       Intervention  Provide education, explanation, and written materials on patient's individual exercise prescription       Expected Outcomes  Short Term: Able to explain program exercise prescription;Long Term: Able to explain home exercise prescription to exercise independently          Nutrition & Weight - Outcomes: Pre Biometrics - 03/09/17 1102      Pre Biometrics   Height  5' 11" (1.803 m)    Weight  175 lb 11.3 oz (79.7 kg)    Waist Circumference  36.5 inches    Hip Circumference  37 inches    Waist to Hip Ratio  0.99 %    BMI (Calculated)  24.52    Triceps Skinfold  12 mm    % Body Fat  23.3 %    Grip Strength  52 kg    Flexibility  10 in    Single Leg Stand  30 seconds      Post Biometrics - 05/10/17 1551       Post  Biometrics   Height  5' 11" (1.803 m)    Weight  179 lb 14.3 oz (81.6 kg)    Waist Circumference  35.25 inches    Hip Circumference  38 inches    Waist to Hip Ratio  0.93 %    BMI (Calculated)  25.1    Triceps Skinfold  6.5 mm    % Body Fat  20.5 %    Grip Strength  57 kg    Flexibility  13.5 in    Single Leg Stand  30 seconds       Nutrition: Nutrition Therapy & Goals - 03/09/17 1506      Nutrition Therapy   Diet  Heart Healthy      Personal Nutrition Goals   Nutrition Goal  Pt to identify and limit food sources of saturated fat, trans fat, and sodium      Intervention Plan   Intervention  Prescribe, educate and counsel regarding individualized specific dietary modifications aiming towards targeted core components such as weight, hypertension, lipid management, diabetes, heart failure and other comorbidities.    Expected Outcomes  Short Term Goal: Understand basic principles of dietary content, such as calories, fat, sodium, cholesterol and nutrients.;Long Term Goal: Adherence to prescribed  nutrition plan.       Nutrition Discharge: Nutrition  Assessments - 05/15/17 0917      MEDFICTS Scores   Pre Score  59    Post Score  53    Score Difference  -6       Education Questionnaire Score: Knowledge Questionnaire Score - 05/10/17 1737      Knowledge Questionnaire Score   Pre Score  23/24    Post Score  23/24      Alan Mcguire graduated from cardiac rehab program today with completion of  exercise sessions in Phase II. Pt maintained good attendance and progressed nicely during his participation in rehab as evidenced by increased MET level.   Medication list reconciled. Repeat  PHQ score- 0 .  Pt has made significant lifestyle changes and should be commended for his success. Pt feels he has achieved his goals during cardiac rehab.   Pt plans to continue exercise by exercising on his own and swimming. Alan Mcguire completed cardiac rehab early . Due to other obligations. Maria Whitaker, RN,BSN 06/05/2017 11:08 AM  Goals reviewed with patient; copy given to patient. 

## 2017-05-11 ENCOUNTER — Ambulatory Visit (INDEPENDENT_AMBULATORY_CARE_PROVIDER_SITE_OTHER): Payer: Managed Care, Other (non HMO) | Admitting: *Deleted

## 2017-05-11 DIAGNOSIS — Z9889 Other specified postprocedural states: Secondary | ICD-10-CM

## 2017-05-11 DIAGNOSIS — Z5181 Encounter for therapeutic drug level monitoring: Secondary | ICD-10-CM

## 2017-05-11 LAB — POCT INR: INR: 2.4

## 2017-05-11 NOTE — Patient Instructions (Signed)
Description   Continue taking 1 tablet (5mg ) daily. Recheck INR in 1 week. Call with any new medications any new scheduled procedures or with any questions or concerns [phone]

## 2017-05-12 ENCOUNTER — Encounter (HOSPITAL_COMMUNITY): Payer: 59

## 2017-05-15 ENCOUNTER — Other Ambulatory Visit: Payer: Self-pay

## 2017-05-15 ENCOUNTER — Ambulatory Visit: Payer: Managed Care, Other (non HMO) | Admitting: Thoracic Surgery (Cardiothoracic Vascular Surgery)

## 2017-05-15 ENCOUNTER — Encounter (HOSPITAL_COMMUNITY): Payer: 59

## 2017-05-15 ENCOUNTER — Ambulatory Visit (HOSPITAL_COMMUNITY): Payer: Managed Care, Other (non HMO) | Attending: Cardiology

## 2017-05-15 ENCOUNTER — Encounter: Payer: Self-pay | Admitting: Thoracic Surgery (Cardiothoracic Vascular Surgery)

## 2017-05-15 VITALS — BP 126/72 | HR 80 | Resp 18 | Ht 71.0 in | Wt 185.4 lb

## 2017-05-15 DIAGNOSIS — Z9889 Other specified postprocedural states: Secondary | ICD-10-CM | POA: Insufficient documentation

## 2017-05-15 DIAGNOSIS — I5189 Other ill-defined heart diseases: Secondary | ICD-10-CM | POA: Insufficient documentation

## 2017-05-15 DIAGNOSIS — I340 Nonrheumatic mitral (valve) insufficiency: Secondary | ICD-10-CM | POA: Diagnosis not present

## 2017-05-15 LAB — ECHOCARDIOGRAM COMPLETE
CHL CUP DOP CALC LVOT VTI: 19.6 cm
CHL CUP MV M VEL: 81.3
CHL CUP RV SYS PRESS: 17 mmHg
E decel time: 275 msec
E/e' ratio: 18.37
FS: 29 % (ref 28–44)
IV/PV OW: 0.99
LA diam end sys: 41 mm
LA vol A4C: 45.3 ml
LADIAMINDEX: 2.02 cm/m2
LASIZE: 41 mm
LAVOL: 57.3 mL
LAVOLIN: 28.2 mL/m2
LDCA: 3.8 cm2
LV PW d: 14 mm — AB (ref 0.6–1.1)
LV TDI E'LATERAL: 5.66
LV e' LATERAL: 5.66 cm/s
LVEEAVG: 18.37
LVEEMED: 18.37
LVOT MV VTI INDEX: 0.95 cm2/m2
LVOT MV VTI: 1.93
LVOT diameter: 22 mm
LVOTPV: 89.4 cm/s
LVOTSV: 74 mL
Lateral S' vel: 11.6 cm/s
MV Dec: 275
MV pk A vel: 119 m/s
MV pk E vel: 104 m/s
MVANNULUSVTI: 38.6 cm
MVAP: 2.56 cm2
MVPG: 4 mmHg
Mean grad: 3 mmHg
P 1/2 time: 66 ms
P 1/2 time: 681 ms
Peak grad: 189 mmHg
Reg peak vel: 189 cm/s
TAPSE: 23 mm
TDI e' medial: 5.77
TRMAXVEL: 189 cm/s

## 2017-05-15 NOTE — Progress Notes (Signed)
[address]       [place] [address]             [phone]     CARDIOTHORACIC SURGERY OFFICE NOTE  Referring Provider is [name], [name], MD PCP is Dorothyann Peng, NP   HPI:  Patient returns the office today for routine follow-up status post minimally invasive mitral valve repair January 24, 2017.  His postoperative recovery has been uneventful and he was last seen here in our office on February 13, 2017 at which time he was doing well.  He reports that he has continued to do very well since then.  He has completed the outpatient cardiac rehab program.  His exercise tolerance is quite good and continues to improve.  He denies any symptoms of exertional shortness of breath or chest discomfort.  He has not had any palpitations.  Overall he is pleased with his progress.   Current Outpatient Medications  Medication Sig Dispense Refill  . acetaminophen (TYLENOL) 500 MG tablet Take 2 tablets (1,000 mg total) every 6 (six) hours by mouth. 30 tablet 0  . amoxicillin (AMOXIL) 500 MG tablet Take all 4 tablets one hour prior to dental procedure 4 tablet 6  . metoprolol succinate (TOPROL XL) 25 MG 24 hr tablet Take 1 tablet (25 mg total) by mouth daily. 30 tablet 3  . Multiple Vitamin (MULTIVITAMIN) capsule Take 1 capsule by mouth daily.    Marland Kitchen warfarin (COUMADIN) 5 MG tablet Take 1/2 tablet to 1 tablet by mouth daily or as directed by coumadin clinic 35 tablet 2   No current facility-administered medications for this visit.       Physical Exam:   BP 126/72 (BP Location: Left Arm, Patient Position: Sitting, Cuff Size: Large)   Pulse 80   Resp 18   Ht 5\' 11"  (1.803 m)   Wt 185 lb 6.4 oz (84.1 kg)   SpO2 99% Comment: RA  BMI 25.86 kg/m   General:  Well-appearing  Chest:   Clear to auscultation  CV:   Regular rate and rhythm without murmur  Incisions:  Completely healed  Abdomen:  Soft nontender  Extremities:  Warm and well perfused  Diagnostic  Tests:  Transthoracic Echocardiography  (Report amended )  Patient:    Alan Mcguire, Alan Mcguire MR #:       [id number] Study Date: 05/15/2017 Gender:     M Age:        62 Height:     180.3 cm Weight:     81.6 kg BSA:        2.03 m^2 Pt. Status: Room:   ATTENDING    Kirk Ruths  SONOGRAPHER  Marygrace Drought, RCS  PERFORMING   [facility], Outpatient  REFERRING    Nafziger, [place], [place] M  REFERRING    Sandoval, [place] M  cc:  ------------------------------------------------------------------- LV EF: 50% -   55%  ------------------------------------------------------------------- Indications:      S/p MVR (Z98.890).  ------------------------------------------------------------------- Study Conclusions  - Left ventricle: The cavity size was normal. Wall thickness was   increased in a pattern of mild LVH. Systolic function was normal.   The estimated ejection fraction was in the range of 50% to 55%.   Wall motion was normal; there were no regional wall motion   abnormalities. Doppler parameters are consistent with abnormal   left ventricular relaxation (grade 1 diastolic dysfunction).   Doppler parameters are consistent with high ventricular  filling   pressure. - Aortic valve: There was trivial regurgitation. - Mitral valve: Prior procedures included surgical repair.  Impressions:  - Normal LV systolic function; mild diastolic dysfunction; mild   LVH; trace AI; s/p MV repair with mean gradient of 3 mmHg and no   MR.  ------------------------------------------------------------------- Study data:  Comparison was made to the study of 09/08/2016.  Study status:  Routine.  Procedure:  The patient reported no pain pre or post test. Transthoracic echocardiography. Image quality was adequate.          Transthoracic echocardiography. M-mode, complete 2D, 3D,spectral Doppler, and color Doppler.  Birthdate:  Patient birthdate: November 01, 1955.  Age:   Patient is 62 yr old.  Sex:  Gender: male.    BMI: 25.1 kg/m^2.  Blood pressure:     130/83  Patient status:  Outpatient.  Study date:  Study date: 05/15/2017. Study time: 12:48 PM.  Location:  [facility]  -------------------------------------------------------------------  ------------------------------------------------------------------- Left ventricle:  The cavity size was normal. Wall thickness was increased in a pattern of mild LVH. Systolic function was normal. The estimated ejection fraction was in the range of 50% to 55%. Wall motion was normal; there were no regional wall motion abnormalities. Doppler parameters are consistent with abnormal left ventricular relaxation (grade 1 diastolic dysfunction). Doppler parameters are consistent with high ventricular filling pressure.   ------------------------------------------------------------------- Aortic valve:   Structurally normal valve.   Cusp separation was normal.  Doppler:  Transvalvular velocity was within the normal range. There was no stenosis. There was trivial regurgitation.  ------------------------------------------------------------------- Aorta:  Aortic root: The aortic root was normal in size.  ------------------------------------------------------------------- Mitral valve:   Mildly thickened leaflets . Prior procedures included surgical repair.  Doppler:  There was no significant regurgitation.    Valve area by pressure half-time: 2.56 cm^2. Indexed valve area by pressure half-time: 1.26 cm^2/m^2. Indexed valve area by continuity equation (using LVOT flow): 0.95 cm^2/m^2.    Mean gradient (D): 3 mm Hg. Peak gradient (D): 4 mm Hg.  ------------------------------------------------------------------- Left atrium:  The atrium was normal in size.  ------------------------------------------------------------------- Right ventricle:  The cavity size was normal. Systolic function  was normal.  ------------------------------------------------------------------- Pulmonic valve:    Structurally normal valve.   Cusp separation was normal.  Doppler:  Transvalvular velocity was within the normal range. There was no regurgitation.  ------------------------------------------------------------------- Tricuspid valve:   Structurally normal valve.   Leaflet separation was normal.  Doppler:  Transvalvular velocity was within the normal range. There was trivial regurgitation.  ------------------------------------------------------------------- Right atrium:  The atrium was normal in size.  ------------------------------------------------------------------- Pericardium:  There was no pericardial effusion.  ------------------------------------------------------------------- Systemic veins: Inferior vena cava: The vessel was normal in size. The respirophasic diameter changes were in the normal range (= 50%), consistent with normal central venous pressure. Diameter: 17 mm.  ------------------------------------------------------------------- Measurements   IVC                                      Value          Reference  ID                                       17    mm       ----------    Left ventricle  Value          Reference  LV ID, ED, PLAX chordal                  45.7  mm       43 - 52  LV ID, ES, PLAX chordal                  32.5  mm       23 - 38  LV fx shortening, PLAX chordal           29    %        >=29  LV PW thickness, ED                      12.23 mm       ----------  IVS/LV PW ratio, ED                      0.93           <=1.3  Stroke volume, 2D                        74    ml       ----------  Stroke volume/bsa, 2D                    36    ml/m^2   ----------  LV e&', lateral                           5.66  cm/s     ----------  LV E/e&', lateral                         18.37          ----------  LV e&', medial                             5.77  cm/s     ----------  LV E/e&', medial                          18.02          ----------  LV e&', average                           5.72  cm/s     ----------  LV E/e&', average                         18.2           ----------    Ventricular septum                       Value          Reference  IVS thickness, ED                        11.34 mm       ----------    LVOT  Value          Reference  LVOT ID, S                               22    mm       ----------  LVOT area                                3.8   cm^2     ----------  LVOT peak velocity, S                    89.4  cm/s     ----------  LVOT mean velocity, S                    57    cm/s     ----------  LVOT VTI, S                              19.6  cm       ----------    Aortic valve                             Value          Reference  Aortic regurg pressure half-time         681   ms       ----------    Aorta                                    Value          Reference  Aortic root ID, ED                       34    mm       ----------    Left atrium                              Value          Reference  LA ID, A-P, ES                           41    mm       ----------  LA ID/bsa, A-P                           2.02  cm/m^2   <=2.2  LA volume, S                             57.3  ml       ----------  LA volume/bsa, S                         28.2  ml/m^2   ----------  LA volume, ES, 1-p A4C                   45.3  ml       ----------  LA volume/bsa, ES, 1-p A4C               22.3  ml/m^2   ----------  LA volume, ES, 1-p A2C                   71    ml       ----------  LA volume/bsa, ES, 1-p A2C               35    ml/m^2   ----------    Mitral valve                             Value          Reference  Mitral E-wave peak velocity              104   cm/s     ----------  Mitral A-wave peak velocity              119   cm/s     ----------  Mitral mean velocity, D                   81.3  cm/s     ----------  Mitral deceleration time         (H)     275   ms       150 - 230  Mitral pressure half-time                66    ms       ----------  Mitral mean gradient, D                  3     mm Hg    ----------  Mitral peak gradient, D                  4     mm Hg    ----------  Mitral E/A ratio, peak                   0.9            ----------  Mitral valve area, PHT, DP               2.56  cm^2     ----------  Mitral valve area/bsa, PHT, DP           1.26  cm^2/m^2 ----------  Mitral valve area/bsa, LVOT              0.95  cm^2/m^2 ----------  continuity  Mitral annulus VTI, D                    38.6  cm       ----------    Pulmonary arteries                       Value          Reference  PA pressure, S, DP                       17    mm Hg    <=30    Tricuspid valve                          Value  Reference  Tricuspid regurg peak velocity           189   cm/s     ----------  Tricuspid peak RV-RA gradient            14    mm Hg    ----------  Tricuspid maximal regurg                 189   cm/s     ----------  velocity, PISA    Right atrium                             Value          Reference  RA ID, S-I, ES, A4C              (H)     53.1  mm       34 - 49  RA area, ES, A4C                         18.8  cm^2     8.3 - 19.5  RA volume, ES, A/L                       53.9  ml       ----------  RA volume/bsa, ES, A/L                   26.6  ml/m^2   ----------    Systemic veins                           Value          Reference  Estimated CVP                            3     mm Hg    ----------    Right ventricle                          Value          Reference  TAPSE                                    23    mm       ----------  RV pressure, S, DP                       17    mm Hg    <=30  RV s&', lateral, S                        11.6  cm/s     ----------  Legend: (L)  and  (H)  mark values outside specified reference  range.  ------------------------------------------------------------------- Karie Georges 2019-03-04T15:19:46   Impression:  Patient is doing very well approximately 4 months status post minimally invasive mitral valve repair.  I have personally reviewed his follow-up echocardiogram performed earlier today which reveals normal left ventricular systolic function and intact mitral valve repair.  There is no residual mitral regurgitation.  Plan:  I have instructed the patient that he may stop taking warfarin.  He should resume taking aspirin 81 mg daily.  We have otherwise not recommended any changes to his current medications.  The patient may continue to increase his physical activity without any particular limitations.  The patient has been reminded regarding the importance of dental hygiene and the lifelong need for antibiotic prophylaxis for all dental cleanings and other related invasive procedures.  He will return to our office for routine follow-up next November, approximately 1 year following his surgery.  He will call and return sooner should specific problems or questions arise.   I spent in excess of 15 minutes during the conduct of this office consultation and >50% of this time involved direct face-to-face encounter with the patient for counseling and/or coordination of their care.   Valentina Gu. Roxy Manns, MD 05/15/2017 2:14 PM

## 2017-05-15 NOTE — Patient Instructions (Signed)
Stop taking Coumadin (warfarin)  It would be reasonable to start taking aspirin 81 mg daily  You may resume unrestricted physical activity without any particular limitations at this time.  Endocarditis is a potentially serious infection of heart valves or inside lining of the heart.  It occurs more commonly in patients with diseased heart valves (such as patient's with aortic or mitral valve disease) and in patients who have undergone heart valve repair or replacement.  Certain surgical and dental procedures may put you at risk, such as dental cleaning, other dental procedures, or any surgery involving the respiratory, urinary, gastrointestinal tract, gallbladder or prostate gland.   To minimize your chances for develooping endocarditis, maintain good oral health and seek prompt medical attention for any infections involving the mouth, teeth, gums, skin or urinary tract.    Always notify your doctor or dentist about your underlying heart valve condition before having any invasive procedures. You will need to take antibiotics before certain procedures, including all routine dental cleanings or other dental procedures.  Your cardiologist or dentist should prescribe these antibiotics for you to be taken ahead of time.

## 2017-05-17 ENCOUNTER — Encounter (HOSPITAL_COMMUNITY): Payer: 59

## 2017-05-19 ENCOUNTER — Encounter (HOSPITAL_COMMUNITY): Payer: 59

## 2017-05-20 NOTE — Progress Notes (Signed)
Alan Mcguire Date of Birth: 10-Aug-1955 Medical Record #[id number]  History of Present Illness: Alan Mcguire is seen  for followup of mitral valve prolapse s/p recent MV repair.  He has a history of mitral valve prolapse with moderate mitral insufficiency. When seen recently exam was concerning for worsening murmur. Echo was done suggesting severe MR. TEE was then performed and showed MVP with severe MR. He subsequently underwent cardiac cath showing normal coronary anatomy and LV function. Normal right heart pressures. He underwent minimally invasive mitral valve repair on 01/24/2017 with complex valvuloplasty including triangular resection of the flail segment of the posterior leaflet. His surgical course progressed appropriately and he was diuresed following the procedure. He maintained NSR throughout admission. Hgb was stable at 13.3 at the time of discharge. On follow up he did complain of some fatigue and metoprolol dose was reduced. Follow up Echo showed a good repair. He has been switched from Coumadin to ASA. He is participating in [facility].   On follow up today he states he is doing great. He denies any dyspnea, palpitations, or chest pain. He remains active and very exercises regularly. He finished up with Cardiac Rehab and is exercising on his own.  Current Outpatient Medications on File Prior to Visit  Medication Sig Dispense Refill  . aspirin 81 MG tablet Take 81 mg by mouth daily.    . metoprolol succinate (TOPROL XL) 25 MG 24 hr tablet Take 1 tablet (25 mg total) by mouth daily. 30 tablet 3  . Multiple Vitamin (MULTIVITAMIN) capsule Take 1 capsule by mouth 3 (three) times a week.      No current facility-administered medications on file prior to visit.     Allergies  Allergen Reactions  . Bee Venom Anaphylaxis    Past Medical History:  Diagnosis Date  . Chronic lower back pain   . Lumbar disc disease    L4-5  . Murmur 9/92011   echo-MVP posterior leaflet with  moderate mitral insufficiency and the jet directed anteriorly,left atrium mildly enlarged  . MVP (mitral valve prolapse)   . Non-rheumatic mitral regurgitation   . Pneumonia 1990s   hx walking pneumonia  . 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   Complex valvuloplasty including triangular resection of flail segment of posterior leaflet, artificial Gore-tex neochord placement x4 and 34 mm Sorin Memo 3D ring annuloplasty via right mini thoracotomy approach    Past Surgical History:  Procedure Laterality Date  . CARDIAC CATHETERIZATION    . COLONOSCOPY    . MITRAL VALVE REPAIR Right 01/24/2017   Procedure: MINIMALLY INVASIVE MITRAL VALVE REPAIR (MVR);  Surgeon: Rexene Alberts, MD;  Location: [facility];  Service: Open Heart Surgery;  Laterality: Right;  . RIGHT/LEFT HEART CATH AND CORONARY ANGIOGRAPHY N/A 10/28/2016   Procedure: Right/Left Heart Cath and Coronary Angiography;  Surgeon: [name], Khyri Hinzman M, MD;  Location: [facility];  Service: Cardiovascular;  Laterality: N/A;  . TEE WITHOUT CARDIOVERSION N/A 09/21/2016   Procedure: TRANSESOPHAGEAL ECHOCARDIOGRAM (TEE);  Surgeon: Skeet Latch, MD;  Location: [facility];  Service: Cardiovascular;  Laterality: N/A;  . TEE WITHOUT CARDIOVERSION N/A 01/24/2017   Procedure: TRANSESOPHAGEAL ECHOCARDIOGRAM (TEE);  Surgeon: Rexene Alberts, MD;  Location: [facility];  Service: Open Heart Surgery;  Laterality: N/A;  . VASECTOMY    . WISDOM TOOTH EXTRACTION      Social History   Tobacco Use  Smoking Status Never Smoker  Smokeless Tobacco Never Used    Social History  Substance and Sexual Activity  Alcohol Use No    Family History  Problem Relation Age of Onset  . Kidney failure Father        died  . Kidney disease Father   . Hypertension Mother   . Lung cancer Mother   . Colon cancer Neg Hx     Review of Systems: As noted in history of present illness .  All other systems were reviewed and are  negative.  Physical Exam: BP 114/78   Pulse 75   Ht 5\' 11"  (1.803 m)   Wt 185 lb (83.9 kg)   BMI 25.80 kg/m  GENERAL:  Well appearing, WM in NAD HEENT:  PERRL, EOMI, sclera are clear. Oropharynx is clear. NECK:  No jugular venous distention, carotid upstroke brisk and symmetric, no bruits, no thyromegaly or adenopathy LUNGS:  Clear to auscultation bilaterally CHEST:  Surgical incisions have healed very well. HEART:  RRR,  PMI not displaced or sustained,S1 and S2 within normal limits, no S3, no S4: no clicks, no rubs, no murmurs ABD:  Soft, nontender. BS +, no masses or bruits. No hepatomegaly, no splenomegaly EXT:  2 + pulses throughout, no edema, no cyanosis no clubbing SKIN:  Warm and dry.  No rashes NEURO:  Alert and oriented x 3. Cranial nerves II through XII intact. PSYCH:  Cognitively intact    LABORATORY DATA:  Lab Results  Component Value Date   WBC 8.2 01/27/2017   HGB 13.3 01/27/2017   HCT 40.2 01/27/2017   PLT 153 01/27/2017   GLUCOSE 111 (H) 01/27/2017   CHOL 244 (H) 10/27/2015   TRIG 95.0 10/27/2015   HDL 67.30 10/27/2015   LDLDIRECT 123.4 06/20/2011   LDLCALC 158 (H) 10/27/2015   ALT 24 01/23/2017   AST 22 01/23/2017   NA 136 01/27/2017   K 4.1 01/27/2017   CL 104 01/27/2017   CREATININE 0.94 01/27/2017   BUN 13 01/27/2017   CO2 27 01/27/2017   TSH 3.82 10/27/2015   PSA 0.84 06/30/2014   INR 2.4 05/11/2017   HGBA1C 5.3 01/23/2017    Echo: 09/08/16: Study Conclusions  - Left ventricle: The cavity size was mildly dilated. Systolic   function was normal. The estimated ejection fraction was in the   range of 60% to 65%. Wall motion was normal; there were no   regional wall motion abnormalities. Features are consistent with   a pseudonormal left ventricular filling pattern, with concomitant   abnormal relaxation and increased filling pressure (grade 2   diastolic dysfunction). Doppler parameters are consistent with   elevated ventricular  end-diastolic filling pressure. - Aortic valve: There was mild regurgitation. - Mitral valve: There was moderate to severe regurgitation. - Left atrium: The atrium was moderately dilated. - Right ventricle: Systolic function was normal. - Tricuspid valve: There was moderate regurgitation. - Pulmonary arteries: Systolic pressure was mildly increased. PA   peak pressure: 43 mm Hg (S).  Impressions:  - Myxomatous, thickened mitral valve leaflets with bileaflet   prolapse posterior >> anterior. There is anteriorly directed jet,   mitral regurgitation is at least moderate to severe, most   probably severe, there is systolic anterior flow reversal in the   pulmonary vein, moderate pulmonary hypertension.   A TEE is recommended for further evaluation.   TEE 09/21/16:Study Conclusions  - Left ventricle: Systolic function was normal. The estimated   ejection fraction was in the range of 60% to 65%. Wall motion was   normal; there were no  regional wall motion abnormalities. - Aortic valve: There was mild regurgitation. - Mitral valve: Severe thickening of the anterior leaflet and   posterior leaflet, consistent with myxomatous proliferation.   Severe, late systolic prolapse, involving the posterior leaflet   and mild prolapse of the anterior leaflet. There was severe,   mild-late systolic regurgitation directed eccentrically and   toward the septum. Severe regurgitation is suggested by an   effective regurgitant orifice >= 0.40 cm^2, a regurgitant volume   >= 60 ml, pulmonary vein systolic flow reversal, and Coanda   effect. Late systolic flow reversal was noted in the right upper   pulmonary vein. Valve area by continuity equation (using LVOT   flow): 1.78 cm^2. - Left atrium: No evidence of thrombus in the atrial cavity or   appendage. No evidence of thrombus in the atrial cavity or   appendage. - Right atrium: No evidence of thrombus in the atrial cavity or   appendage. - Atrial  septum: No defect or patent foramen ovale was identified   by color flow Doppler.  Cardiac cath 10/28/16: Procedures   Right/Left Heart Cath and Coronary Angiography  Conclusion     The left ventricular systolic function is normal.  The left ventricular ejection fraction is 55-65% by visual estimate.  There is no aortic valve stenosis.  There is severe (4+) mitral regurgitation and moderate mitral valve prolapse.  Normal coronary anatomy   1. Normal coronary anatomy 2. Normal LV function 3. Normal right heart pressures. 4. Normal PCWP with mildly elevated LVEDP 5. Normal cardiac output.   Plan: consider for MV repair.     Echo 05/15/17: Study Conclusions  - Left ventricle: The cavity size was normal. Wall thickness was   increased in a pattern of mild LVH. Systolic function was normal.   The estimated ejection fraction was in the range of 50% to 55%.   Wall motion was normal; there were no regional wall motion   abnormalities. Doppler parameters are consistent with abnormal   left ventricular relaxation (grade 1 diastolic dysfunction).   Doppler parameters are consistent with high ventricular filling   pressure. - Aortic valve: There was trivial regurgitation. - Mitral valve: Prior procedures included surgical repair.  Impressions:  - Normal LV systolic function; mild diastolic dysfunction; mild   LVH; trace AI; s/p MV repair with mean gradient of 3 mmHg and no   MR.   Assessment / Plan: 1. Mitral valve prolapse with severe mitral insufficiency by Echo and TEE. Evidence of pulmonary HTN and LAE by Echo.  Now s/p MV repair with excellent result clinically and by Echo. Continue ASA and Toprol for now. If stable will probably stop beta blocker on next visit in 6 months.

## 2017-05-22 ENCOUNTER — Encounter (HOSPITAL_COMMUNITY): Payer: 59

## 2017-05-22 ENCOUNTER — Ambulatory Visit: Payer: Managed Care, Other (non HMO) | Admitting: Cardiology

## 2017-05-22 ENCOUNTER — Encounter: Payer: Self-pay | Admitting: Cardiology

## 2017-05-22 VITALS — BP 114/78 | HR 75 | Ht 71.0 in | Wt 185.0 lb

## 2017-05-22 DIAGNOSIS — Z9889 Other specified postprocedural states: Secondary | ICD-10-CM | POA: Diagnosis not present

## 2017-05-22 DIAGNOSIS — I341 Nonrheumatic mitral (valve) prolapse: Secondary | ICD-10-CM | POA: Diagnosis not present

## 2017-05-22 NOTE — Patient Instructions (Signed)
Continue your current therapy  I will see you in 6 months.   

## 2017-05-24 ENCOUNTER — Encounter (HOSPITAL_COMMUNITY): Payer: 59

## 2017-05-26 ENCOUNTER — Encounter (HOSPITAL_COMMUNITY): Payer: 59

## 2017-05-29 ENCOUNTER — Encounter (HOSPITAL_COMMUNITY): Payer: 59

## 2017-05-31 ENCOUNTER — Encounter (HOSPITAL_COMMUNITY): Payer: 59

## 2017-06-02 ENCOUNTER — Encounter (HOSPITAL_COMMUNITY): Payer: 59

## 2017-06-05 ENCOUNTER — Encounter (HOSPITAL_COMMUNITY): Payer: 59

## 2017-06-05 NOTE — Addendum Note (Signed)
Encounter addended by: Sol Passer on: 06/05/2017 7:52 AM  Actions taken: Visit Navigator Flowsheet section accepted

## 2017-06-05 NOTE — Addendum Note (Signed)
Encounter addended by: Magda Kiel, RN on: 06/05/2017 11:11 AM  Actions taken: Sign clinical note, Episode resolved

## 2017-06-05 NOTE — Addendum Note (Signed)
Encounter addended by: Jewel Baize, RD on: 06/05/2017 10:57 AM  Actions taken: Flowsheet data copied forward, Visit Navigator Flowsheet section accepted

## 2017-07-05 ENCOUNTER — Encounter: Payer: Self-pay | Admitting: Adult Health

## 2017-07-05 ENCOUNTER — Ambulatory Visit: Payer: Managed Care, Other (non HMO) | Admitting: Adult Health

## 2017-07-05 VITALS — BP 126/86 | Temp 98.4°F | Wt 175.0 lb

## 2017-07-05 DIAGNOSIS — H6121 Impacted cerumen, right ear: Secondary | ICD-10-CM

## 2017-07-05 NOTE — Progress Notes (Signed)
Subjective:    Patient ID: Alan Mcguire, male    DOB: 1955/10/22, 62 y.o.   MRN: [id number]  HPI   62 year old male who  has a past medical history of Chronic lower back pain, Lumbar disc disease, Murmur (9/92011), MVP (mitral valve prolapse), Non-rheumatic mitral regurgitation, Pneumonia (1990s), and S/P minimally invasive mitral valve repair (01/24/2017).  He presents to the office today for the acute complaint of feeling as though his right ear was clogged. Reports loss of hearing and feeling as though water gets stuck in his ear  Review of Systems See HPI   Past Medical History:  Diagnosis Date  . Chronic lower back pain   . Lumbar disc disease    L4-5  . Murmur 9/92011   echo-MVP posterior leaflet with moderate mitral insufficiency and the jet directed anteriorly,left atrium mildly enlarged  . MVP (mitral valve prolapse)   . Non-rheumatic mitral regurgitation   . Pneumonia 1990s   hx walking pneumonia  . 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   Complex valvuloplasty including triangular resection of flail segment of posterior leaflet, artificial Gore-tex neochord placement x4 and 34 mm Sorin Memo 3D ring annuloplasty via right mini thoracotomy approach    Social History   Socioeconomic History  . Marital status: Married    Spouse name: Not on file  . Number of children: Not on file  . Years of education: Not on file  . Highest education level: Not on file  Occupational History  . Not on file  Social Needs  . Financial resource strain: Not on file  . Food insecurity:    Worry: Not on file    Inability: Not on file  . Transportation needs:    Medical: Not on file    Non-medical: Not on file  Tobacco Use  . Smoking status: Never Smoker  . Smokeless tobacco: Never Used  Substance and Sexual Activity  . Alcohol use: No  . Drug use: No  . Sexual activity: Not on file  Lifestyle  . Physical activity:    Days per week: Not on file    Minutes  per session: Not on file  . Stress: Not on file  Relationships  . Social connections:    Talks on phone: Not on file    Gets together: Not on file    Attends religious service: Not on file    Active member of club or organization: Not on file    Attends meetings of clubs or organizations: Not on file    Relationship status: Not on file  . Intimate partner violence:    Fear of current or ex partner: Not on file    Emotionally abused: Not on file    Physically abused: Not on file    Forced sexual activity: Not on file  Other Topics Concern  . Not on file  Social History Narrative   [employer] engineer company    Married    4 kids ( 2 in college and 2 married)    He likes to play golf and work in the yard.    He coaches swimming    Past Surgical History:  Procedure Laterality Date  . CARDIAC CATHETERIZATION    . COLONOSCOPY    . MITRAL VALVE REPAIR Right 01/24/2017   Procedure: MINIMALLY INVASIVE MITRAL VALVE REPAIR (MVR);  Surgeon: Rexene Alberts, MD;  Location: [facility];  Service: Open Heart Surgery;  Laterality: Right;  . RIGHT/LEFT HEART CATH  AND CORONARY ANGIOGRAPHY N/A 10/28/2016   Procedure: Right/Left Heart Cath and Coronary Angiography;  Surgeon: [name], [name], MD;  Location: [facility];  Service: Cardiovascular;  Laterality: N/A;  . TEE WITHOUT CARDIOVERSION N/A 09/21/2016   Procedure: TRANSESOPHAGEAL ECHOCARDIOGRAM (TEE);  Surgeon: Skeet Latch, MD;  Location: [facility];  Service: Cardiovascular;  Laterality: N/A;  . TEE WITHOUT CARDIOVERSION N/A 01/24/2017   Procedure: TRANSESOPHAGEAL ECHOCARDIOGRAM (TEE);  Surgeon: Rexene Alberts, MD;  Location: [place];  Service: Open Heart Surgery;  Laterality: N/A;  . VASECTOMY    . WISDOM TOOTH EXTRACTION      Family History  Problem Relation Age of Onset  . Kidney failure Father        died  . Kidney disease Father   . Hypertension Mother   . Lung cancer Mother   . Colon cancer Neg Hx     Allergies   Allergen Reactions  . Bee Venom Anaphylaxis    Current Outpatient Medications on File Prior to Visit  Medication Sig Dispense Refill  . aspirin 81 MG tablet Take 81 mg by mouth daily.    . metoprolol succinate (TOPROL XL) 25 MG 24 hr tablet Take 1 tablet (25 mg total) by mouth daily. 30 tablet 3  . Multiple Vitamin (MULTIVITAMIN) capsule Take 1 capsule by mouth 3 (three) times a week.      No current facility-administered medications on file prior to visit.     There were no vitals taken for this visit.      Objective:   Physical Exam  Constitutional: He is oriented to person, place, and time. He appears well-developed and well-nourished. No distress.  HENT:  Cerumen impaction noted in right ear canal. TM not visualized   Cardiovascular: Normal rate, regular rhythm, normal heart sounds and intact distal pulses. Exam reveals no gallop and no friction rub.  No murmur heard. Pulmonary/Chest: Effort normal and breath sounds normal. No respiratory distress. He has no wheezes. He has no rales. He exhibits no tenderness.  Neurological: He is alert and oriented to person, place, and time.  Skin: Skin is warm and dry. No rash noted. He is not diaphoretic. No erythema. No pallor.  Psychiatric: He has a normal mood and affect. His behavior is normal. Thought content normal.  Nursing note and vitals reviewed.     Assessment & Plan:  1. Impacted cerumen of right ear - Cerumen impaction was easily removed with water irrigation. No signs of infection noted. Patient reports improvement in symptoms. Tolerated procedure well   Dorothyann Peng, NP

## 2017-10-18 ENCOUNTER — Ambulatory Visit (INDEPENDENT_AMBULATORY_CARE_PROVIDER_SITE_OTHER): Payer: Managed Care, Other (non HMO) | Admitting: Adult Health

## 2017-10-18 ENCOUNTER — Encounter: Payer: Self-pay | Admitting: Adult Health

## 2017-10-18 VITALS — BP 120/70 | Temp 98.2°F | Ht 71.0 in | Wt 176.0 lb

## 2017-10-18 DIAGNOSIS — Z125 Encounter for screening for malignant neoplasm of prostate: Secondary | ICD-10-CM

## 2017-10-18 DIAGNOSIS — N529 Male erectile dysfunction, unspecified: Secondary | ICD-10-CM

## 2017-10-18 DIAGNOSIS — Z0000 Encounter for general adult medical examination without abnormal findings: Secondary | ICD-10-CM | POA: Diagnosis not present

## 2017-10-18 DIAGNOSIS — I341 Nonrheumatic mitral (valve) prolapse: Secondary | ICD-10-CM

## 2017-10-18 LAB — CBC WITH DIFFERENTIAL/PLATELET
BASOS PCT: 1.1 % (ref 0.0–3.0)
Basophils Absolute: 0.1 10*3/uL (ref 0.0–0.1)
EOS PCT: 1.1 % (ref 0.0–5.0)
Eosinophils Absolute: 0.1 10*3/uL (ref 0.0–0.7)
HCT: 46.2 % (ref 39.0–52.0)
HEMOGLOBIN: 15.6 g/dL (ref 13.0–17.0)
LYMPHS ABS: 0.8 10*3/uL (ref 0.7–4.0)
Lymphocytes Relative: 14.9 % (ref 12.0–46.0)
MCHC: 33.8 g/dL (ref 30.0–36.0)
MCV: 85.3 fl (ref 78.0–100.0)
MONO ABS: 0.4 10*3/uL (ref 0.1–1.0)
Monocytes Relative: 7.5 % (ref 3.0–12.0)
Neutro Abs: 3.9 10*3/uL (ref 1.4–7.7)
Neutrophils Relative %: 75.4 % (ref 43.0–77.0)
Platelets: 228 10*3/uL (ref 150.0–400.0)
RBC: 5.41 Mil/uL (ref 4.22–5.81)
RDW: 13.3 % (ref 11.5–15.5)
WBC: 5.2 10*3/uL (ref 4.0–10.5)

## 2017-10-18 LAB — TSH: TSH: 2.93 u[IU]/mL (ref 0.35–4.50)

## 2017-10-18 LAB — BASIC METABOLIC PANEL
BUN: 19 mg/dL (ref 6–23)
CHLORIDE: 106 meq/L (ref 96–112)
CO2: 31 meq/L (ref 19–32)
Calcium: 9.5 mg/dL (ref 8.4–10.5)
Creatinine, Ser: 1.03 mg/dL (ref 0.40–1.50)
GFR: 77.94 mL/min (ref >60.00)
Glucose, Bld: 89 mg/dL (ref 70–99)
POTASSIUM: 4.7 meq/L (ref 3.5–5.1)
Sodium: 143 mEq/L (ref 135–145)

## 2017-10-18 LAB — HEPATIC FUNCTION PANEL
ALT: 16 U/L (ref 0–53)
AST: 14 U/L (ref 0–37)
Albumin: 4.2 g/dL (ref 3.5–5.2)
Alkaline Phosphatase: 75 U/L (ref 39–117)
BILIRUBIN TOTAL: 0.6 mg/dL (ref 0.2–1.2)
Bilirubin, Direct: 0.1 mg/dL (ref 0.0–0.3)
Total Protein: 7 g/dL (ref 6.0–8.3)

## 2017-10-18 LAB — LIPID PANEL
CHOLESTEROL: 213 mg/dL — AB (ref 0–200)
HDL: 59.5 mg/dL (ref >39.00)
LDL CALC: 134 mg/dL — AB (ref 0–99)
NonHDL: 153.1
TRIGLYCERIDES: 95 mg/dL (ref 0.0–149.0)
Total CHOL/HDL Ratio: 4
VLDL: 19 mg/dL (ref 0.0–40.0)

## 2017-10-18 LAB — PSA: PSA: 1.06 ng/mL (ref 0.10–4.00)

## 2017-10-18 NOTE — Progress Notes (Addendum)
Subjective:    Patient ID: Alan Mcguire, male    DOB: Nov 04, 1955, 62 y.o.   MRN: [id number]  HPI Patient presents for yearly preventative medicine examination. He is a pleasant 62 year old male who  has a past medical history of Chronic lower back pain, Lumbar disc disease, Murmur (9/92011), MVP (mitral valve prolapse), Non-rheumatic mitral regurgitation, Pneumonia (1990s), and S/P minimally invasive mitral valve repair (01/24/2017).  Mitral Valve Prolapse s/p MV repair - doing well. Controlled with ASA,  Is followed by Cardiology. He ran out of Toprol.  ED - has been an issue getting an erection. His symptoms started soon after his MV surgery. Continues to have issues since he ran out of Toprol at the beginning of the summer.   All immunizations and health maintenance protocols were reviewed with the patient and needed orders were placed. He is UTD on vaccinations   Appropriate screening laboratory values were ordered for the patient including screening of hyperlipidemia, renal function and hepatic function.  Medication reconciliation,  past medical history, social history, problem list and allergies were reviewed in detail with the patient  Goals were established with regard to weight loss, exercise, and  diet in compliance with medications. He stays active and exercise. He is eating a heart healthy diet.   He is up to date on routine health maintenance items such as colonoscopy, dental and vision screens. He is seen by Dermatology on a yearly basis   Denies any acute issues   Review of Systems  Constitutional: Negative.   HENT: Negative.   Eyes: Negative.   Respiratory: Negative.   Cardiovascular: Negative.   Gastrointestinal: Negative.   Endocrine: Negative.   Genitourinary: Negative.   Musculoskeletal: Negative.   Skin: Negative.   Allergic/Immunologic: Negative.   Neurological: Negative.   Hematological: Negative.   Psychiatric/Behavioral: Negative.   All other  systems reviewed and are negative.  Past Medical History:  Diagnosis Date  . Chronic lower back pain   . Lumbar disc disease    L4-5  . Murmur 9/92011   echo-MVP posterior leaflet with moderate mitral insufficiency and the jet directed anteriorly,left atrium mildly enlarged  . MVP (mitral valve prolapse)   . Non-rheumatic mitral regurgitation   . Pneumonia 1990s   hx walking pneumonia  . 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   Complex valvuloplasty including triangular resection of flail segment of posterior leaflet, artificial Gore-tex neochord placement x4 and 34 mm Sorin Memo 3D ring annuloplasty via right mini thoracotomy approach    Social History   Socioeconomic History  . Marital status: Married    Spouse name: Not on file  . Number of children: Not on file  . Years of education: Not on file  . Highest education level: Not on file  Occupational History  . Not on file  Social Needs  . Financial resource strain: Not on file  . Food insecurity:    Worry: Not on file    Inability: Not on file  . Transportation needs:    Medical: Not on file    Non-medical: Not on file  Tobacco Use  . Smoking status: Never Smoker  . Smokeless tobacco: Never Used  Substance and Sexual Activity  . Alcohol use: No  . Drug use: No  . Sexual activity: Not on file  Lifestyle  . Physical activity:    Days per week: Not on file    Minutes per session: Not on file  . Stress: Not on file  Relationships  . Social connections:    Talks on phone: Not on file    Gets together: Not on file    Attends religious service: Not on file    Active member of club or organization: Not on file    Attends meetings of clubs or organizations: Not on file    Relationship status: Not on file  . Intimate partner violence:    Fear of current or ex partner: Not on file    Emotionally abused: Not on file    Physically abused: Not on file    Forced sexual activity: Not on file  Other Topics  Concern  . Not on file  Social History Narrative   Materials engineer company    Married    4 kids ( 2 in college and 2 married)    He likes to play golf and work in the yard.    He coaches swimming    Past Surgical History:  Procedure Laterality Date  . CARDIAC CATHETERIZATION    . COLONOSCOPY    . MITRAL VALVE REPAIR Right 01/24/2017   Procedure: MINIMALLY INVASIVE MITRAL VALVE REPAIR (MVR);  Surgeon: Rexene Alberts, MD;  Location: [facility];  Service: Open Heart Surgery;  Laterality: Right;  . RIGHT/LEFT HEART CATH AND CORONARY ANGIOGRAPHY N/A 10/28/2016   Procedure: Right/Left Heart Cath and Coronary Angiography;  Surgeon: [name], [name], MD;  Location: [facility];  Service: Cardiovascular;  Laterality: N/A;  . TEE WITHOUT CARDIOVERSION N/A 09/21/2016   Procedure: TRANSESOPHAGEAL ECHOCARDIOGRAM (TEE);  Surgeon: Skeet Latch, MD;  Location: [facility];  Service: Cardiovascular;  Laterality: N/A;  . TEE WITHOUT CARDIOVERSION N/A 01/24/2017   Procedure: TRANSESOPHAGEAL ECHOCARDIOGRAM (TEE);  Surgeon: Rexene Alberts, MD;  Location: [facility];  Service: Open Heart Surgery;  Laterality: N/A;  . VASECTOMY    . WISDOM TOOTH EXTRACTION      Family History  Problem Relation Age of Onset  . Kidney failure Father        died  . Kidney disease Father   . Hypertension Mother   . Lung cancer Mother   . Colon cancer Neg Hx     Allergies  Allergen Reactions  . Bee Venom Anaphylaxis    Current Outpatient Medications on File Prior to Visit  Medication Sig Dispense Refill  . aspirin 81 MG tablet Take 81 mg by mouth daily.    . Multiple Vitamin (MULTIVITAMIN) capsule Take 1 capsule by mouth 3 (three) times a week.      No current facility-administered medications on file prior to visit.     BP 120/70   Temp 98.2 F (36.8 C) (Oral)   Ht 5\' 11"  (1.803 m)   Wt 176 lb (79.8 kg)   BMI 24.55 kg/m       Objective:   Physical Exam  Constitutional: He is oriented to  person, place, and time. Vital signs are normal. He appears well-developed and well-nourished. No distress.  HENT:  Head: Normocephalic and atraumatic.  Right Ear: External ear normal.  Left Ear: External ear normal.  Nose: Nose normal.  Mouth/Throat: Oropharynx is clear and moist. No oropharyngeal exudate.  Eyes: Pupils are equal, round, and reactive to light. Conjunctivae and EOM are normal. Right eye exhibits no discharge. Left eye exhibits no discharge. No scleral icterus.  Neck: Trachea normal and normal range of motion. Neck supple. No JVD present. Carotid bruit is not present. No tracheal deviation present. No thyromegaly present.  Cardiovascular: Normal rate,  regular rhythm, normal heart sounds and intact distal pulses. Exam reveals no gallop and no friction rub.  No murmur heard. Pulmonary/Chest: Effort normal and breath sounds normal. No stridor. No respiratory distress. He has no wheezes. He has no rales. He exhibits no tenderness.  Abdominal: Soft. Bowel sounds are normal. He exhibits no distension and no mass. There is no tenderness. There is no rebound and no guarding. No hernia.  Genitourinary:  Genitourinary Comments: Will do PSA   Musculoskeletal: Normal range of motion. He exhibits no edema, tenderness or deformity.  Lymphadenopathy:    He has no cervical adenopathy.  Neurological: He is alert and oriented to person, place, and time. He displays normal reflexes. No cranial nerve deficit or sensory deficit. He exhibits normal muscle tone. Coordination normal.  Skin: Skin is warm and dry. Capillary refill takes less than 2 seconds. No rash noted. He is not diaphoretic. No erythema. No pallor.  Psychiatric: He has a normal mood and affect. His behavior is normal. Judgment and thought content normal.  Nursing note and vitals reviewed.     Assessment & Plan:  1. Routine general medical examination at a health care facility - Continue to exercise and eat healthy  - He works  outside, encouraged to wear sun screen when outside  - Follow up in one year or sooner if needed - Basic metabolic panel - CBC with Differential/Platelet - Hepatic function panel - Lipid panel - TSH  2. MVP (mitral valve prolapse) - Follow up with Cardiology as directed   3. Prostate cancer screening  - PSA  4. Erectile dysfunction, unspecified erectile dysfunction type  - Testosterone Total,Free,Bio, Males - Consider Urology referral   Dorothyann Peng, NP

## 2017-10-19 ENCOUNTER — Other Ambulatory Visit: Payer: Self-pay | Admitting: Family Medicine

## 2017-10-19 LAB — TESTOSTERONE TOTAL,FREE,BIO, MALES
Albumin: 4.2 g/dL (ref 3.6–5.1)
Sex Hormone Binding: 47 nmol/L (ref 22–77)
TESTOSTERONE FREE: 56.6 pg/mL (ref 46.0–224.0)
TESTOSTERONE: 554 ng/dL (ref 250–827)
Testosterone, Bioavailable: 109.1 ng/dL — ABNORMAL LOW (ref >=110.0)

## 2017-10-19 MED ORDER — SIMVASTATIN 10 MG PO TABS: 10.0000 mg | ORAL_TABLET | Freq: Every day | ORAL | 3 refills | Status: DC

## 2018-01-22 ENCOUNTER — Ambulatory Visit: Payer: Managed Care, Other (non HMO) | Admitting: Thoracic Surgery (Cardiothoracic Vascular Surgery)

## 2018-02-05 ENCOUNTER — Other Ambulatory Visit: Payer: Self-pay

## 2018-02-05 ENCOUNTER — Encounter: Payer: Self-pay | Admitting: Thoracic Surgery (Cardiothoracic Vascular Surgery)

## 2018-02-05 ENCOUNTER — Ambulatory Visit: Payer: Managed Care, Other (non HMO) | Admitting: Thoracic Surgery (Cardiothoracic Vascular Surgery)

## 2018-02-05 VITALS — BP 140/90 | HR 77 | Resp 18 | Ht 71.0 in | Wt 184.0 lb

## 2018-02-05 DIAGNOSIS — Z9889 Other specified postprocedural states: Secondary | ICD-10-CM

## 2018-02-05 NOTE — Progress Notes (Signed)
[facility] CONSULTATION REPORT  Referring Provider is [name], [name], MD PCP is Dorothyann Peng, NP  Chief Complaint  Patient presents with  . Follow-up    yearly f/u, s/p Mini MVR 01/24/17, ECHO 05/15/17    HPI:  Patient is a 62 year old male who returns to the office today for routine follow-up status post minimally invasive mitral valve repair for mitral valve prolapse with severe mitral regurgitation on January 24, 2017.  His postoperative recovery was uneventful and he was last seen here in our office on May 15, 2017 at which time he was doing well.  Routine follow-up echocardiogram performed May 15, 2017 revealed normal left ventricular systolic function with ejection fraction estimated 50 to 55%.  The mitral valve repair appeared intact with no residual mitral regurgitation and no mitral stenosis.  Since then the patient has done quite well and he returns her office today for routine follow-up.  He reports that overall he feels "much improved" in comparison with how he felt prior to surgery, although he admits that he really had minimal symptoms prior to surgery.  He does feel that his exercise tolerance is probably somewhat better and that he seems to have more energy.  He states that he only with very strenuous exertion.  He has never had any chest pain or chest tightness and he has no other significant physical limitations.  Overall he is quite pleased with his outcome.  Past Medical History:  Diagnosis Date  . Chronic lower back pain   . Lumbar disc disease    L4-5  . Murmur 9/92011   echo-MVP posterior leaflet with moderate mitral insufficiency and the jet directed anteriorly,left atrium mildly enlarged  . MVP (mitral valve prolapse)   . Non-rheumatic mitral regurgitation   . Pneumonia 1990s   hx walking pneumonia  . 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   Complex valvuloplasty  including triangular resection of flail segment of posterior leaflet, artificial Gore-tex neochord placement x4 and 34 mm Sorin Memo 3D ring annuloplasty via right mini thoracotomy approach    Past Surgical History:  Procedure Laterality Date  . CARDIAC CATHETERIZATION    . COLONOSCOPY    . MITRAL VALVE REPAIR Right 01/24/2017   Procedure: MINIMALLY INVASIVE MITRAL VALVE REPAIR (MVR);  Surgeon: Rexene Alberts, MD;  Location: [place];  Service: Open Heart Surgery;  Laterality: Right;  . RIGHT/LEFT HEART CATH AND CORONARY ANGIOGRAPHY N/A 10/28/2016   Procedure: Right/Left Heart Cath and Coronary Angiography;  Surgeon: [name], [name], MD;  Location: [facility];  Service: Cardiovascular;  Laterality: N/A;  . TEE WITHOUT CARDIOVERSION N/A 09/21/2016   Procedure: TRANSESOPHAGEAL ECHOCARDIOGRAM (TEE);  Surgeon: Skeet Latch, MD;  Location: [facility];  Service: Cardiovascular;  Laterality: N/A;  . TEE WITHOUT CARDIOVERSION N/A 01/24/2017   Procedure: TRANSESOPHAGEAL ECHOCARDIOGRAM (TEE);  Surgeon: Rexene Alberts, MD;  Location: [facility];  Service: Open Heart Surgery;  Laterality: N/A;  . VASECTOMY    . WISDOM TOOTH EXTRACTION      Family History  Problem Relation Age of Onset  . Kidney failure Father        died  . Kidney disease Father   . Hypertension Mother   . Lung cancer Mother   . Colon cancer Neg Hx     Social History   Socioeconomic History  . Marital status: Married    Spouse name: Not on file  . Number of children:  Not on file  . Years of education: Not on file  . Highest education level: Not on file  Occupational History  . Not on file  Social Needs  . Financial resource strain: Not on file  . Food insecurity:    Worry: Not on file    Inability: Not on file  . Transportation needs:    Medical: Not on file    Non-medical: Not on file  Tobacco Use  . Smoking status: Never Smoker  . Smokeless tobacco: Never Used  Substance and Sexual Activity  .  Alcohol use: No  . Drug use: No  . Sexual activity: Not on file  Lifestyle  . Physical activity:    Days per week: Not on file    Minutes per session: Not on file  . Stress: Not on file  Relationships  . Social connections:    Talks on phone: Not on file    Gets together: Not on file    Attends religious service: Not on file    Active member of club or organization: Not on file    Attends meetings of clubs or organizations: Not on file    Relationship status: Not on file  . Intimate partner violence:    Fear of current or ex partner: Not on file    Emotionally abused: Not on file    Physically abused: Not on file    Forced sexual activity: Not on file  Other Topics Concern  . Not on file  Social History Narrative   Materials engineer company    Married    4 kids ( 2 in college and 2 married)    He likes to play golf and work in the yard.    He coaches swimming    Current Outpatient Medications  Medication Sig Dispense Refill  . aspirin 81 MG tablet Take 81 mg by mouth daily.    . Multiple Vitamin (MULTIVITAMIN) capsule Take 1 capsule by mouth 3 (three) times a week.     . simvastatin (ZOCOR) 10 MG tablet Take 1 tablet (10 mg total) by mouth at bedtime. 90 tablet 3   No current facility-administered medications for this visit.     Allergies  Allergen Reactions  . Bee Venom Anaphylaxis      Review of Systems:  Per HPI    Physical Exam:   BP 140/90 (BP Location: Right Arm, Patient Position: Sitting, Cuff Size: Normal)   Pulse 77   Resp 18   Ht 5\' 11"  (1.803 m)   Wt 184 lb (83.5 kg)   SpO2 97% Comment: ra  BMI 25.66 kg/m   General:    well-appearing  HEENT:  Unremarkable   Neck:   no JVD, no bruits, no adenopathy   Chest:   clear to auscultation, symmetrical breath sounds, no wheezes, no rhonchi   CV:   RRR, no murmur  Incisions:  Completely healed  Abdomen:  soft, non-tender, no masses   Extremities:  warm, well-perfused, pulses palpable, no LE  edema  Rectal/GU  Deferred  Neuro:   Grossly non-focal and symmetrical throughout  Skin:   Clean and dry, no rashes, no breakdown   Diagnostic Tests:  Transthoracic Echocardiography  (Report amended )  Patient:    Marcellis, Frampton MR #:       [id number] Study Date: 05/15/2017 Gender:     M Age:        61 Height:     180.3 cm Weight:  81.6 kg BSA:        2.03 m^2 Pt. Status: Room:   ATTENDING    Kirk Ruths  SONOGRAPHER  Marygrace Drought, RCS  PERFORMING   [facility], Outpatient  REFERRING    Nafziger, Desha, [place] M  REFERRING    Allen, [place] M  cc:  ------------------------------------------------------------------- LV EF: 50% -   55%  ------------------------------------------------------------------- Indications:      S/p MVR (Z98.890).  ------------------------------------------------------------------- Study Conclusions  - Left ventricle: The cavity size was normal. Wall thickness was   increased in a pattern of mild LVH. Systolic function was normal.   The estimated ejection fraction was in the range of 50% to 55%.   Wall motion was normal; there were no regional wall motion   abnormalities. Doppler parameters are consistent with abnormal   left ventricular relaxation (grade 1 diastolic dysfunction).   Doppler parameters are consistent with high ventricular filling   pressure. - Aortic valve: There was trivial regurgitation. - Mitral valve: Prior procedures included surgical repair.  Impressions:  - Normal LV systolic function; mild diastolic dysfunction; mild   LVH; trace AI; s/p MV repair with mean gradient of 3 mmHg and no   MR.  ------------------------------------------------------------------- Study data:  Comparison was made to the study of 09/08/2016.  Study status:  Routine.  Procedure:  The patient reported no pain pre or post test. Transthoracic echocardiography. Image quality was adequate.           Transthoracic echocardiography. M-mode, complete 2D, 3D,spectral Doppler, and color Doppler.  Birthdate:  Patient birthdate: Jun 20, 1955.  Age:  Patient is 61 yr old.  Sex:  Gender: male.    BMI: 25.1 kg/m^2.  Blood pressure:     130/83  Patient status:  Outpatient.  Study date:  Study date: 05/15/2017. Study time: 12:48 PM.  Location:  [facility]  -------------------------------------------------------------------  ------------------------------------------------------------------- Left ventricle:  The cavity size was normal. Wall thickness was increased in a pattern of mild LVH. Systolic function was normal. The estimated ejection fraction was in the range of 50% to 55%. Wall motion was normal; there were no regional wall motion abnormalities. Doppler parameters are consistent with abnormal left ventricular relaxation (grade 1 diastolic dysfunction). Doppler parameters are consistent with high ventricular filling pressure.   ------------------------------------------------------------------- Aortic valve:   Structurally normal valve.   Cusp separation was normal.  Doppler:  Transvalvular velocity was within the normal range. There was no stenosis. There was trivial regurgitation.  ------------------------------------------------------------------- Aorta:  Aortic root: The aortic root was normal in size.  ------------------------------------------------------------------- Mitral valve:   Mildly thickened leaflets . Prior procedures included surgical repair.  Doppler:  There was no significant regurgitation.    Valve area by pressure half-time: 2.56 cm^2. Indexed valve area by pressure half-time: 1.26 cm^2/m^2. Indexed valve area by continuity equation (using LVOT flow): 0.95 cm^2/m^2.    Mean gradient (D): 3 mm Hg. Peak gradient (D): 4 mm Hg.  ------------------------------------------------------------------- Left atrium:  The atrium was normal in  size.  ------------------------------------------------------------------- Right ventricle:  The cavity size was normal. Systolic function was normal.  ------------------------------------------------------------------- Pulmonic valve:    Structurally normal valve.   Cusp separation was normal.  Doppler:  Transvalvular velocity was within the normal range. There was no regurgitation.  ------------------------------------------------------------------- Tricuspid valve:   Structurally normal valve.   Leaflet separation was normal.  Doppler:  Transvalvular velocity was within the normal range. There was trivial regurgitation.  ------------------------------------------------------------------- Right atrium:  The atrium was normal in size.  ------------------------------------------------------------------- Pericardium:  There was no pericardial effusion.  ------------------------------------------------------------------- Systemic veins: Inferior vena cava: The vessel was normal in size. The respirophasic diameter changes were in the normal range (= 50%), consistent with normal central venous pressure. Diameter: 17 mm.  ------------------------------------------------------------------- Measurements   IVC                                      Value          Reference  ID                                       17    mm       ----------    Left ventricle                           Value          Reference  LV ID, ED, PLAX chordal                  45.7  mm       43 - 52  LV ID, ES, PLAX chordal                  32.5  mm       23 - 38  LV fx shortening, PLAX chordal           29    %        >=29  LV PW thickness, ED                      12.23 mm       ----------  IVS/LV PW ratio, ED                      0.93           <=1.3  Stroke volume, 2D                        74    ml       ----------  Stroke volume/bsa, 2D                    36    ml/m^2   ----------  LV e&', lateral                            5.66  cm/s     ----------  LV E/e&', lateral                         18.37          ----------  LV e&', medial                            5.77  cm/s     ----------  LV E/e&', medial                          18.02          ----------  LV e&', average  5.72  cm/s     ----------  LV E/e&', average                         18.2           ----------    Ventricular septum                       Value          Reference  IVS thickness, ED                        11.34 mm       ----------    LVOT                                     Value          Reference  LVOT ID, S                               22    mm       ----------  LVOT area                                3.8   cm^2     ----------  LVOT peak velocity, S                    89.4  cm/s     ----------  LVOT mean velocity, S                    57    cm/s     ----------  LVOT VTI, S                              19.6  cm       ----------    Aortic valve                             Value          Reference  Aortic regurg pressure half-time         681   ms       ----------    Aorta                                    Value          Reference  Aortic root ID, ED                       34    mm       ----------    Left atrium                              Value          Reference  LA ID, A-P, ES  41    mm       ----------  LA ID/bsa, A-P                           2.02  cm/m^2   <=2.2  LA volume, S                             57.3  ml       ----------  LA volume/bsa, S                         28.2  ml/m^2   ----------  LA volume, ES, 1-p A4C                   45.3  ml       ----------  LA volume/bsa, ES, 1-p A4C               22.3  ml/m^2   ----------  LA volume, ES, 1-p A2C                   71    ml       ----------  LA volume/bsa, ES, 1-p A2C               35    ml/m^2   ----------    Mitral valve                             Value          Reference  Mitral E-wave peak  velocity              104   cm/s     ----------  Mitral A-wave peak velocity              119   cm/s     ----------  Mitral mean velocity, D                  81.3  cm/s     ----------  Mitral deceleration time         (H)     275   ms       150 - 230  Mitral pressure half-time                66    ms       ----------  Mitral mean gradient, D                  3     mm Hg    ----------  Mitral peak gradient, D                  4     mm Hg    ----------  Mitral E/A ratio, peak                   0.9            ----------  Mitral valve area, PHT, DP               2.56  cm^2     ----------  Mitral valve area/bsa, PHT, DP           1.26  cm^2/m^2 ----------  Mitral valve area/bsa, LVOT  0.95  cm^2/m^2 ----------  continuity  Mitral annulus VTI, D                    38.6  cm       ----------    Pulmonary arteries                       Value          Reference  PA pressure, S, DP                       17    mm Hg    <=30    Tricuspid valve                          Value          Reference  Tricuspid regurg peak velocity           189   cm/s     ----------  Tricuspid peak RV-RA gradient            14    mm Hg    ----------  Tricuspid maximal regurg                 189   cm/s     ----------  velocity, PISA    Right atrium                             Value          Reference  RA ID, S-I, ES, A4C              (H)     53.1  mm       34 - 49  RA area, ES, A4C                         18.8  cm^2     8.3 - 19.5  RA volume, ES, A/L                       53.9  ml       ----------  RA volume/bsa, ES, A/L                   26.6  ml/m^2   ----------    Systemic veins                           Value          Reference  Estimated CVP                            3     mm Hg    ----------    Right ventricle                          Value          Reference  TAPSE                                    23    mm       ----------  RV pressure, S,  DP                       17    mm Hg    <=30  RV s&',  lateral, S                        11.6  cm/s     ----------  Legend: (L)  and  (H)  mark values outside specified reference range.  ------------------------------------------------------------------- Karie Georges 2019-03-04T15:19:46   Impression:  Patient is doing very well approximately 1 year status post minimally invasive mitral valve repair   Plan:  We have not recommended any change the patient's current medications.  I have encouraged the patient to continue to increase his physical activity without any particular limitations.  The patient has been reminded regarding the importance of dental hygiene and the lifelong need for antibiotic prophylaxis for all dental cleanings and other related invasive procedures.  The patient will continue to follow-up intermittently with Dr. [name] and return to our office in the future only should specific problems or questions arise.   I spent in excess of 10 minutes during the conduct of this office consultation and >50% of this time involved direct face-to-face encounter with the patient for counseling and/or coordination of their care.   Valentina Gu. Roxy Manns, MD 02/05/2018 3:56 PM

## 2018-02-05 NOTE — Patient Instructions (Signed)

## 2018-03-30 ENCOUNTER — Ambulatory Visit: Payer: Managed Care, Other (non HMO) | Admitting: Adult Health

## 2018-03-30 ENCOUNTER — Encounter: Payer: Self-pay | Admitting: Adult Health

## 2018-03-30 VITALS — BP 124/86 | Temp 97.7°F | Wt 186.0 lb

## 2018-03-30 DIAGNOSIS — T148XXA Other injury of unspecified body region, initial encounter: Secondary | ICD-10-CM

## 2018-03-30 DIAGNOSIS — Z23 Encounter for immunization: Secondary | ICD-10-CM

## 2018-03-30 MED ORDER — METHYLPREDNISOLONE 4 MG PO TBPK: ORAL_TABLET | ORAL | 0 refills | Status: DC

## 2018-03-30 MED ORDER — CYCLOBENZAPRINE HCL 10 MG PO TABS: 10.0000 mg | ORAL_TABLET | Freq: Every day | ORAL | 0 refills | Status: DC

## 2018-03-30 MED FILL — METHYLPREDNISOLONE 4 MG TAB: 4 | 6 days supply | Qty: 21 | Fill #0

## 2018-03-30 MED FILL — CYCLOBENZAPRINE HCL 10 MG T: 10 | 10 days supply | Qty: 10 | Fill #0

## 2018-03-30 NOTE — Progress Notes (Signed)
Subjective:    Patient ID: Alan Mcguire, male    DOB: 09/11/1955, 63 y.o.   MRN: [id number]  HPI 63 year old male who  has a past medical history of Chronic lower back pain, Lumbar disc disease, Murmur (9/92011), MVP (mitral valve prolapse), Non-rheumatic mitral regurgitation, Pneumonia (1990s), and S/P minimally invasive mitral valve repair (01/24/2017).  He presents to the office today for an acute issue of possible muscle strain on left upper back. He reports that he has had a " lump" near his shoulder blade that has become sore with palpation. His symptoms have been present for about a month. When he moves his soulder in certain directions he feels as " stretching" sensation in the area.   Denies loss of ROM or numbness/tingling in left arm.   Review of Systems See HPI   Past Medical History:  Diagnosis Date  . Chronic lower back pain   . Lumbar disc disease    L4-5  . Murmur 9/92011   echo-MVP posterior leaflet with moderate mitral insufficiency and the jet directed anteriorly,left atrium mildly enlarged  . MVP (mitral valve prolapse)   . Non-rheumatic mitral regurgitation   . Pneumonia 1990s   hx walking pneumonia  . 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   Complex valvuloplasty including triangular resection of flail segment of posterior leaflet, artificial Gore-tex neochord placement x4 and 34 mm Sorin Memo 3D ring annuloplasty via right mini thoracotomy approach    Social History   Socioeconomic History  . Marital status: Married    Spouse name: Not on file  . Number of children: Not on file  . Years of education: Not on file  . Highest education level: Not on file  Occupational History  . Not on file  Social Needs  . Financial resource strain: Not on file  . Food insecurity:    Worry: Not on file    Inability: Not on file  . Transportation needs:    Medical: Not on file    Non-medical: Not on file  Tobacco Use  . Smoking status: Never  Smoker  . Smokeless tobacco: Never Used  Substance and Sexual Activity  . Alcohol use: No  . Drug use: No  . Sexual activity: Not on file  Lifestyle  . Physical activity:    Days per week: Not on file    Minutes per session: Not on file  . Stress: Not on file  Relationships  . Social connections:    Talks on phone: Not on file    Gets together: Not on file    Attends religious service: Not on file    Active member of club or organization: Not on file    Attends meetings of clubs or organizations: Not on file    Relationship status: Not on file  . Intimate partner violence:    Fear of current or ex partner: Not on file    Emotionally abused: Not on file    Physically abused: Not on file    Forced sexual activity: Not on file  Other Topics Concern  . Not on file  Social History Narrative   Materials engineer company    Married    4 kids ( 2 in college and 2 married)    He likes to play golf and work in the yard.    He coaches swimming    Past Surgical History:  Procedure Laterality Date  . CARDIAC CATHETERIZATION    . COLONOSCOPY    .  MITRAL VALVE REPAIR Right 01/24/2017   Procedure: MINIMALLY INVASIVE MITRAL VALVE REPAIR (MVR);  Surgeon: Rexene Alberts, MD;  Location: [facility];  Service: Open Heart Surgery;  Laterality: Right;  . RIGHT/LEFT HEART CATH AND CORONARY ANGIOGRAPHY N/A 10/28/2016   Procedure: Right/Left Heart Cath and Coronary Angiography;  Surgeon: [name], [name], MD;  Location: [facility];  Service: Cardiovascular;  Laterality: N/A;  . TEE WITHOUT CARDIOVERSION N/A 09/21/2016   Procedure: TRANSESOPHAGEAL ECHOCARDIOGRAM (TEE);  Surgeon: Skeet Latch, MD;  Location: [facility];  Service: Cardiovascular;  Laterality: N/A;  . TEE WITHOUT CARDIOVERSION N/A 01/24/2017   Procedure: TRANSESOPHAGEAL ECHOCARDIOGRAM (TEE);  Surgeon: Rexene Alberts, MD;  Location: [facility];  Service: Open Heart Surgery;  Laterality: N/A;  . VASECTOMY    . WISDOM TOOTH  EXTRACTION      Family History  Problem Relation Age of Onset  . Kidney failure Father        died  . Kidney disease Father   . Hypertension Mother   . Lung cancer Mother   . Colon cancer Neg Hx     Allergies  Allergen Reactions  . Bee Venom Anaphylaxis    Current Outpatient Medications on File Prior to Visit  Medication Sig Dispense Refill  . aspirin 81 MG tablet Take 81 mg by mouth daily.    . Multiple Vitamin (MULTIVITAMIN) capsule Take 1 capsule by mouth 3 (three) times a week.     . simvastatin (ZOCOR) 10 MG tablet Take 1 tablet (10 mg total) by mouth at bedtime. 90 tablet 3   No current facility-administered medications on file prior to visit.     BP 124/86   Temp 97.7 F (36.5 C)   Wt 186 lb (84.4 kg)   BMI 25.94 kg/m       Objective:   Physical Exam Vitals signs and nursing note reviewed.  Constitutional:      Appearance: Normal appearance.  Musculoskeletal: Normal range of motion.        General: Tenderness present. No swelling.     Right shoulder: He exhibits tenderness, swelling and spasm. He exhibits normal range of motion, no bony tenderness, no effusion, no crepitus, no deformity and normal strength.       Arms:  Skin:    Capillary Refill: Capillary refill takes less than 2 seconds.  Neurological:     General: No focal deficit present.     Mental Status: He is alert and oriented to person, place, and time.        Assessment & Plan:  1. Muscle strain - Exam consistent with muscle strain. No abscess or lipoma felt ( he does have a large lipoma on his left shoulder blade though)  - cyclobenzaprine (FLEXERIL) 10 MG tablet; Take 1 tablet (10 mg total) by mouth at bedtime.  Dispense: 10 tablet; Refill: 0 - methylPREDNISolone (MEDROL DOSEPAK) 4 MG TBPK tablet; Take as directed  Dispense: 21 tablet; Refill: 0 - Warm compress and stretching exercises would be beneficial   2. Need for influenza vaccination  - Flu Vaccine QUAD 6+ mos PF IM (Fluarix  Quad PF)   Dorothyann Peng, NP

## 2018-05-08 ENCOUNTER — Other Ambulatory Visit: Payer: Self-pay | Admitting: Adult Health

## 2018-05-08 ENCOUNTER — Telehealth: Payer: Self-pay | Admitting: Adult Health

## 2018-05-08 MED ORDER — AMOXICILLIN 500 MG PO CAPS: 2000.0000 mg | ORAL_CAPSULE | Freq: Once | ORAL | 0 refills | Status: DC

## 2018-05-08 MED ORDER — AMOXICILLIN 500 MG PO CAPS: 2000.0000 mg | ORAL_CAPSULE | Freq: Once | ORAL | 0 refills | Status: AC

## 2018-05-08 NOTE — Telephone Encounter (Signed)
Amoxicillin sent to [facility].   I accidentally sent it to [facility]. Please call and d/c this

## 2018-05-08 NOTE — Telephone Encounter (Signed)
Monica at [facility] notified to [facility] rx.  Nothing further needed.

## 2018-05-08 NOTE — Telephone Encounter (Signed)
Copied from [place] [id number]. Topic: Quick Communication - See Telephone Encounter >> May 08, 2018 10:48 AM Rayann Heman wrote: CRM for notification. See Telephone encounter for: 05/08/18. Pt called and stated that he is having a dental procedure on 05/10/18. Pt states that the office suggested that he call and get an antibiotic because he had open heart surgery. [facility]  Please advise

## 2018-10-03 ENCOUNTER — Encounter: Payer: Self-pay | Admitting: Gastroenterology

## 2018-10-22 ENCOUNTER — Telehealth: Payer: Self-pay | Admitting: Adult Health

## 2018-10-22 NOTE — Telephone Encounter (Signed)
Patient is calling to request COVID testing.  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 Please advise CB- [phone]

## 2018-10-23 ENCOUNTER — Other Ambulatory Visit: Payer: Self-pay

## 2018-10-23 ENCOUNTER — Other Ambulatory Visit: Payer: Self-pay | Admitting: Adult Health

## 2018-10-23 DIAGNOSIS — Z20828 Contact with and (suspected) exposure to other viral communicable diseases: Secondary | ICD-10-CM

## 2018-10-23 DIAGNOSIS — Z20822 Contact with and (suspected) exposure to covid-19: Secondary | ICD-10-CM

## 2018-10-23 NOTE — Telephone Encounter (Signed)
I have placed an order for COVID testing at [facility]. He does not need an appointment.   Please self quarantine until results have come back

## 2018-10-23 NOTE — Telephone Encounter (Signed)
See note

## 2018-10-23 NOTE — Telephone Encounter (Signed)
Pt notified to go to [facility].  Nothing further needed.

## 2018-10-24 LAB — NOVEL CORONAVIRUS, NAA: SARS-CoV-2, NAA: NOT DETECTED

## 2018-11-16 ENCOUNTER — Other Ambulatory Visit: Payer: Self-pay | Admitting: Adult Health

## 2018-11-20 ENCOUNTER — Encounter: Payer: Self-pay | Admitting: Family Medicine

## 2018-11-20 NOTE — Telephone Encounter (Signed)
Filled for 90 days.  Letter released to MyChart. 

## 2018-12-17 ENCOUNTER — Telehealth: Payer: Self-pay | Admitting: Adult Health

## 2018-12-17 NOTE — Telephone Encounter (Signed)
Pt is having Dental surgery (having a crown put in) on Thursday and Pt wanted to know if Tommi Rumps can send in an antibiotic for him  to take in advance when he has oral surgery/ please advise asap

## 2018-12-18 ENCOUNTER — Other Ambulatory Visit: Payer: Self-pay | Admitting: Adult Health

## 2018-12-18 MED ORDER — AMOXICILLIN 500 MG PO CAPS: 2000.0000 mg | ORAL_CAPSULE | Freq: Once | ORAL | 0 refills | Status: AC

## 2018-12-18 MED FILL — AMOXICILLIN 500 MG CAPSULE: 500 | 1 days supply | Qty: 4 | Fill #0

## 2018-12-18 NOTE — Telephone Encounter (Signed)
Pt notified to pick up at the pharmacy.  Nothing further needed.

## 2018-12-18 NOTE — Telephone Encounter (Signed)
abx sent to [facility]

## 2019-02-20 ENCOUNTER — Other Ambulatory Visit: Payer: Self-pay | Admitting: Adult Health

## 2019-02-21 NOTE — Telephone Encounter (Signed)
Sent to the pharmacy by e-scribe for 90 days. 

## 2019-03-28 IMAGING — CR DG CHEST 2V
2 series · 2 of 2 positions shown · non-contrast
Comparison: None.

CLINICAL DATA: 61-year-old male preoperative study for cardiac
catheterization. Mitral regurgitation.

EXAM:
CHEST  2 VIEW

[w chest pa]
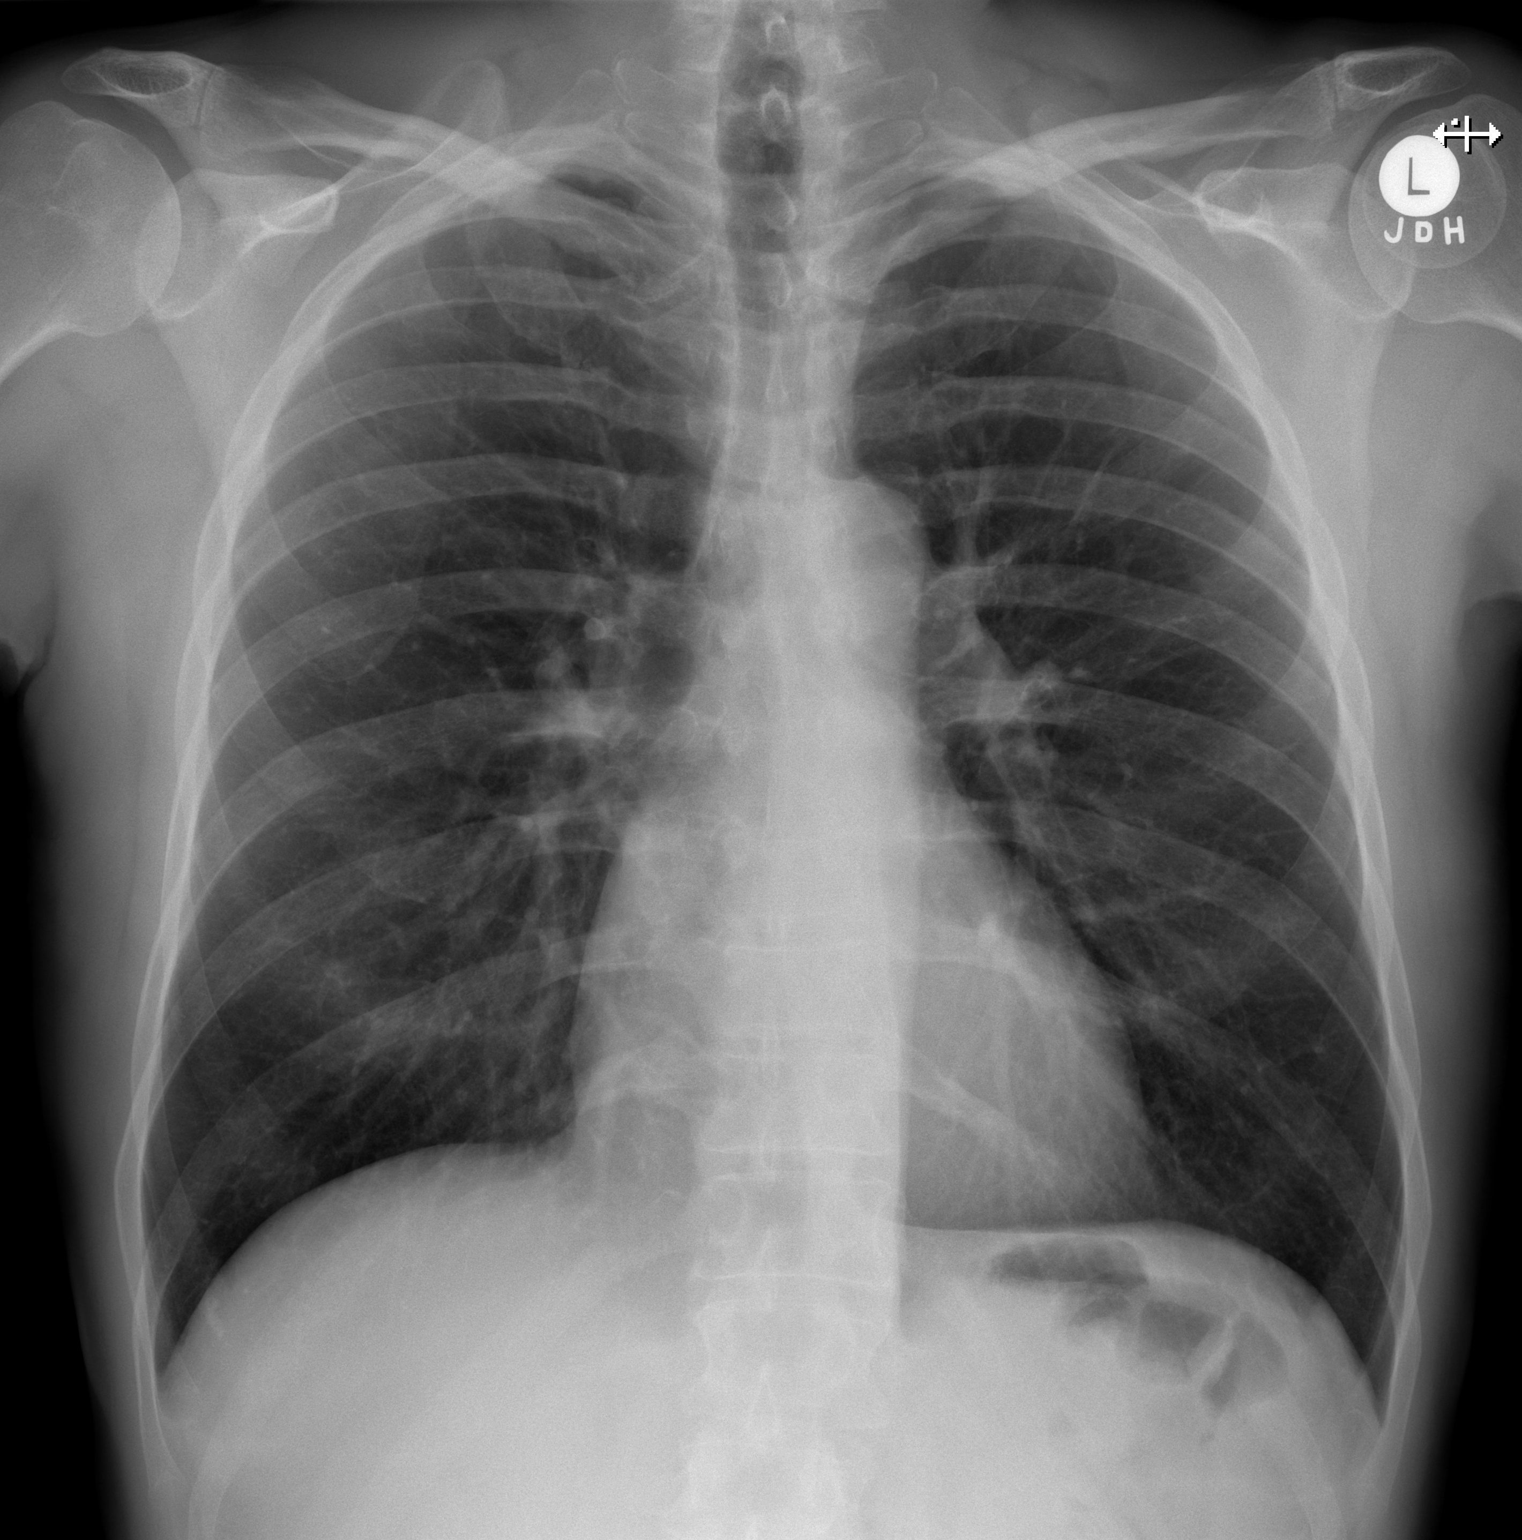

[w chest lat]
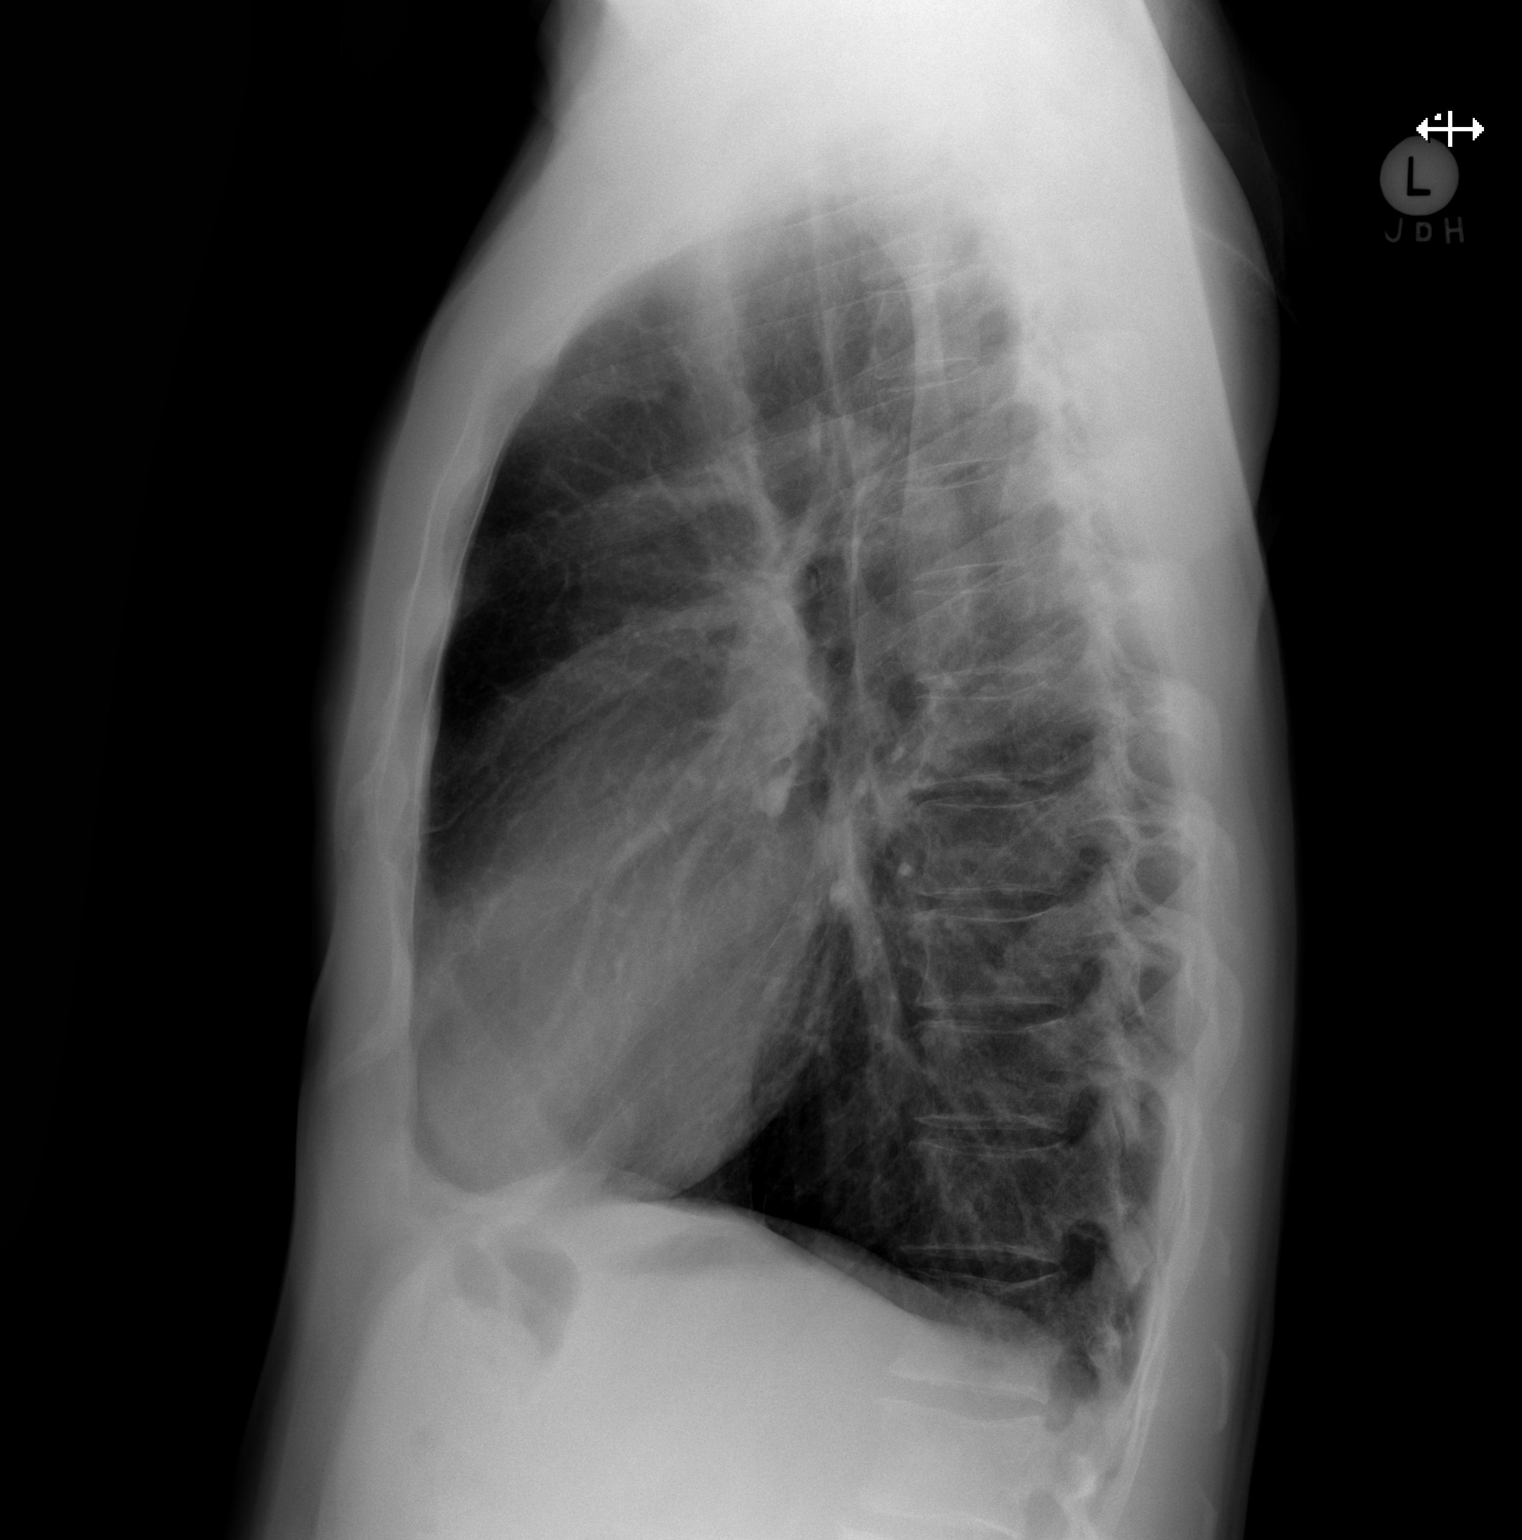

[2 of 2 positions shown; findings below may reference images not displayed]

FINDINGS: Cardiac size and contour are within normal limits. Other mediastinal
contours are within normal limits. Visualized tracheal air column is
within normal limits.

Lung volumes are at the upper limits of normal. No pneumothorax,
pulmonary edema, pleural effusion or confluent pulmonary opacity.

No acute osseous abnormality identified. Negative visible bowel gas
pattern.
IMPRESSION: No acute cardiopulmonary abnormality.

## 2019-05-01 ENCOUNTER — Other Ambulatory Visit: Payer: Self-pay

## 2019-05-02 ENCOUNTER — Encounter: Payer: Managed Care, Other (non HMO) | Admitting: Adult Health

## 2019-05-08 ENCOUNTER — Other Ambulatory Visit: Payer: Self-pay

## 2019-05-09 ENCOUNTER — Encounter: Payer: Self-pay | Admitting: Adult Health

## 2019-05-09 ENCOUNTER — Ambulatory Visit (INDEPENDENT_AMBULATORY_CARE_PROVIDER_SITE_OTHER): Payer: Managed Care, Other (non HMO) | Admitting: Adult Health

## 2019-05-09 VITALS — BP 118/82 | Temp 97.8°F | Ht 70.5 in | Wt 187.0 lb

## 2019-05-09 DIAGNOSIS — E782 Mixed hyperlipidemia: Secondary | ICD-10-CM

## 2019-05-09 DIAGNOSIS — Z0000 Encounter for general adult medical examination without abnormal findings: Secondary | ICD-10-CM | POA: Diagnosis not present

## 2019-05-09 DIAGNOSIS — Z125 Encounter for screening for malignant neoplasm of prostate: Secondary | ICD-10-CM | POA: Diagnosis not present

## 2019-05-09 DIAGNOSIS — Z1211 Encounter for screening for malignant neoplasm of colon: Secondary | ICD-10-CM

## 2019-05-09 DIAGNOSIS — I341 Nonrheumatic mitral (valve) prolapse: Secondary | ICD-10-CM | POA: Diagnosis not present

## 2019-05-09 LAB — COMPREHENSIVE METABOLIC PANEL
ALT: 31 U/L (ref 0–53)
AST: 19 U/L (ref 0–37)
Albumin: 4.3 g/dL (ref 3.5–5.2)
Alkaline Phosphatase: 90 U/L (ref 39–117)
BUN: 16 mg/dL (ref 6–23)
CO2: 32 mEq/L (ref 19–32)
Calcium: 10.2 mg/dL (ref 8.4–10.5)
Chloride: 102 mEq/L (ref 96–112)
Creatinine, Ser: 1.1 mg/dL (ref 0.40–1.50)
GFR: 67.63 mL/min (ref >60.00)
Glucose, Bld: 92 mg/dL (ref 70–99)
Potassium: 4.4 mEq/L (ref 3.5–5.1)
Sodium: 140 mEq/L (ref 135–145)
Total Bilirubin: 0.9 mg/dL (ref 0.2–1.2)
Total Protein: 7.5 g/dL (ref 6.0–8.3)

## 2019-05-09 LAB — LIPID PANEL
Cholesterol: 246 mg/dL — ABNORMAL HIGH (ref 0–200)
HDL: 61.4 mg/dL (ref >39.00)
LDL Cholesterol: 162 mg/dL — ABNORMAL HIGH (ref 0–99)
NonHDL: 184.12
Total CHOL/HDL Ratio: 4
Triglycerides: 109 mg/dL (ref 0.0–149.0)
VLDL: 21.8 mg/dL (ref 0.0–40.0)

## 2019-05-09 LAB — CBC WITH DIFFERENTIAL/PLATELET
Basophils Absolute: 0.1 10*3/uL (ref 0.0–0.1)
Basophils Relative: 1.4 % (ref 0.0–3.0)
Eosinophils Absolute: 0.1 10*3/uL (ref 0.0–0.7)
Eosinophils Relative: 2.5 % (ref 0.0–5.0)
HCT: 50.8 % (ref 39.0–52.0)
Hemoglobin: 16.8 g/dL (ref 13.0–17.0)
Lymphocytes Relative: 20 % (ref 12.0–46.0)
Lymphs Abs: 1.1 10*3/uL (ref 0.7–4.0)
MCHC: 33.2 g/dL (ref 30.0–36.0)
MCV: 86.3 fl (ref 78.0–100.0)
Monocytes Absolute: 0.5 10*3/uL (ref 0.1–1.0)
Monocytes Relative: 8.7 % (ref 3.0–12.0)
Neutro Abs: 3.6 10*3/uL (ref 1.4–7.7)
Neutrophils Relative %: 67.4 % (ref 43.0–77.0)
Platelets: 247 10*3/uL (ref 150.0–400.0)
RBC: 5.88 Mil/uL — ABNORMAL HIGH (ref 4.22–5.81)
RDW: 13.1 % (ref 11.5–15.5)
WBC: 5.3 10*3/uL (ref 4.0–10.5)

## 2019-05-09 LAB — TSH: TSH: 2.59 u[IU]/mL (ref 0.35–4.50)

## 2019-05-09 LAB — PSA: PSA: 1.38 ng/mL (ref 0.10–4.00)

## 2019-05-09 NOTE — Progress Notes (Signed)
Subjective:    Patient ID: Alan Mcguire, male    DOB: 05-Oct-1955, 64 y.o.   MRN: [id number]  HPI Patient presents for yearly preventative medicine examination. 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  Mitral Valve Prolapse s/p MV repair in November 2018 -doing well.  He has had no issues since surgery.  Denies shortness of breath, chest pain, or poor exercise tolerance.  Does take 81 mg aspirin daily  Hyperlipidemia - takes Simvastatin 10 mg.  He was started on this in August 2019.  He denies fatigue but does endorse mild myalgia at times in his calfs Lab Results  Component Value Date   CHOL 213 (H) 10/18/2017   HDL 59.50 10/18/2017   LDLCALC 134 (H) 10/18/2017   LDLDIRECT 123.4 06/20/2011   TRIG 95.0 10/18/2017   CHOLHDL 4 10/18/2017    All immunizations and health maintenance protocols were reviewed with the patient and needed orders were placed. Due for flu shot   Appropriate screening laboratory values were ordered for the patient including screening of hyperlipidemia, renal function and hepatic function. If indicated by BPH, a PSA was ordered.  Medication reconciliation,  past medical history, social history, problem list and allergies were reviewed in detail with the patient  Goals were established with regard to weight loss, exercise, and  diet in compliance with medications  Wt Readings from Last 3 Encounters:  05/09/19 187 lb (84.8 kg)  03/30/18 186 lb (84.4 kg)  02/05/18 184 lb (83.5 kg)   He is overdue for his colonoscopy - he is on the 5 year plan; last was in 2015    Review of Systems  Constitutional: Negative.   HENT: Negative.   Eyes: Negative.   Respiratory: Negative.   Cardiovascular: Negative.   Gastrointestinal:  Negative.   Endocrine: Negative.   Genitourinary: Negative.   Musculoskeletal: Negative.   Skin: Negative.   Allergic/Immunologic: Negative.   Neurological: Negative.   Hematological: Negative.   Psychiatric/Behavioral: Negative.   All other systems reviewed and are negative.  Past Medical History:  Diagnosis Date  . Chronic lower back pain   . Lumbar disc disease    L4-5  . Murmur 9/92011   echo-MVP posterior leaflet with moderate mitral insufficiency and the jet directed anteriorly,left atrium mildly enlarged  . MVP (mitral valve prolapse)   . Non-rheumatic mitral regurgitation   . Pneumonia 1990s   hx walking pneumonia  . 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   Complex valvuloplasty including triangular resection of flail segment of posterior leaflet, artificial Gore-tex neochord placement x4 and 34 mm Sorin Memo 3D ring annuloplasty via right mini thoracotomy approach    Social History   Socioeconomic History  . Marital status: Married    Spouse name: Not on file  . Number of children: Not on file  . Years of education: Not on file  . Highest education level: Not on file  Occupational History  . Not on file  Tobacco Use  . Smoking status: Never Smoker  . Smokeless tobacco: Never Used  Substance and Sexual Activity  . Alcohol use: No  . Drug use: No  . Sexual activity: Not on file  Other Topics Concern  . Not on file  Social History Narrative   Materials engineer company  Married    4 kids ( 2 in college and 2 married)    He likes to play golf and work in the yard.    He coaches swimming   Social Determinants of Radio broadcast assistant Strain:   . Difficulty of Paying Living Expenses: Not on file  Food Insecurity:   . Worried About Charity fundraiser in the Last Year: Not on file  . Ran Out of Food in the Last Year: Not on file  Transportation Needs:   . Lack of Transportation (Medical): Not on file  . Lack of Transportation  (Non-Medical): Not on file  Physical Activity:   . Days of Exercise per Week: Not on file  . Minutes of Exercise per Session: Not on file  Stress:   . Feeling of Stress : Not on file  Social Connections:   . Frequency of Communication with Friends and Family: Not on file  . Frequency of Social Gatherings with Friends and Family: Not on file  . Attends Religious Services: Not on file  . Active Member of Clubs or Organizations: Not on file  . Attends Archivist Meetings: Not on file  . Marital Status: Not on file  Intimate Partner Violence:   . Fear of Current or Ex-Partner: Not on file  . Emotionally Abused: Not on file  . Physically Abused: Not on file  . Sexually Abused: Not on file    Past Surgical History:  Procedure Laterality Date  . CARDIAC CATHETERIZATION    . COLONOSCOPY    . MITRAL VALVE REPAIR Right 01/24/2017   Procedure: MINIMALLY INVASIVE MITRAL VALVE REPAIR (MVR);  Surgeon: Rexene Alberts, MD;  Location: [place];  Service: Open Heart Surgery;  Laterality: Right;  . RIGHT/LEFT HEART CATH AND CORONARY ANGIOGRAPHY N/A 10/28/2016   Procedure: Right/Left Heart Cath and Coronary Angiography;  Surgeon: [name], [name], MD;  Location: [place] [facility];  Service: Cardiovascular;  Laterality: N/A;  . TEE WITHOUT CARDIOVERSION N/A 09/21/2016   Procedure: TRANSESOPHAGEAL ECHOCARDIOGRAM (TEE);  Surgeon: Skeet Latch, MD;  Location: [place];  Service: Cardiovascular;  Laterality: N/A;  . TEE WITHOUT CARDIOVERSION N/A 01/24/2017   Procedure: TRANSESOPHAGEAL ECHOCARDIOGRAM (TEE);  Surgeon: Rexene Alberts, MD;  Location: [place];  Service: Open Heart Surgery;  Laterality: N/A;  . VASECTOMY    . WISDOM TOOTH EXTRACTION      Family History  Problem Relation Age of Onset  . Kidney failure Father        died  . Kidney disease Father   . Hypertension Mother   . Lung cancer Mother   . Colon cancer Neg Hx     Allergies  Allergen Reactions  . Bee Venom  Anaphylaxis    Current Outpatient Medications on File Prior to Visit  Medication Sig Dispense Refill  . aspirin 81 MG tablet Take 81 mg by mouth daily.    . Multiple Vitamin (MULTIVITAMIN) capsule Take 1 capsule by mouth 3 (three) times a week.     . simvastatin (ZOCOR) 10 MG tablet TAKE 1 TABLET BY MOUTH AT BEDTIME 90 tablet 0   No current facility-administered medications on file prior to visit.    BP 118/82   Temp 97.8 F (36.6 C)   Ht 5' 10.5" (1.791 m)   Wt 187 lb (84.8 kg)   BMI 26.45 kg/m       Objective:   Physical Exam Vitals and nursing note reviewed.  Constitutional:  General: He is not in acute distress.    Appearance: Normal appearance. He is well-developed and normal weight.  HENT:     Head: Normocephalic and atraumatic.     Right Ear: Tympanic membrane, ear canal and external ear normal. There is no impacted cerumen.     Left Ear: Tympanic membrane, ear canal and external ear normal. There is no impacted cerumen.     Nose: Nose normal. No congestion or rhinorrhea.     Mouth/Throat:     Mouth: Mucous membranes are moist.     Pharynx: Oropharynx is clear. No oropharyngeal exudate or posterior oropharyngeal erythema.  Eyes:     General:        Right eye: No discharge.        Left eye: No discharge.     Extraocular Movements: Extraocular movements intact.     Conjunctiva/sclera: Conjunctivae normal.     Pupils: Pupils are equal, round, and reactive to light.  Neck:     Vascular: No carotid bruit.     Trachea: No tracheal deviation.  Cardiovascular:     Rate and Rhythm: Normal rate and regular rhythm.     Pulses: Normal pulses.     Heart sounds: Normal heart sounds. No murmur. No friction rub. No gallop.   Pulmonary:     Effort: Pulmonary effort is normal. No respiratory distress.     Breath sounds: Normal breath sounds. No stridor. No wheezing, rhonchi or rales.  Chest:     Chest wall: No tenderness.  Abdominal:     General: Bowel sounds are  normal. There is no distension.     Palpations: Abdomen is soft. There is no mass.     Tenderness: There is no abdominal tenderness. There is no right CVA tenderness, left CVA tenderness, guarding or rebound.     Hernia: No hernia is present.  Musculoskeletal:        General: No swelling, tenderness, deformity or signs of injury. Normal range of motion.     Right lower leg: No edema.     Left lower leg: No edema.  Lymphadenopathy:     Cervical: No cervical adenopathy.  Skin:    General: Skin is warm and dry.     Capillary Refill: Capillary refill takes less than 2 seconds.     Coloration: Skin is not jaundiced or pale.     Findings: No bruising, erythema, lesion or rash.  Neurological:     General: No focal deficit present.     Mental Status: He is alert and oriented to person, place, and time.     Cranial Nerves: No cranial nerve deficit.     Sensory: No sensory deficit.     Motor: No weakness.     Coordination: Coordination normal.     Gait: Gait normal.     Deep Tendon Reflexes: Reflexes normal.  Psychiatric:        Mood and Affect: Mood normal.        Behavior: Behavior normal.        Thought Content: Thought content normal.        Judgment: Judgment normal.       Assessment & Plan:  1. Routine general medical examination at a health care facility  - CBC with Differential/Platelet - Comprehensive metabolic panel - Lipid panel - TSH  2. MVP (mitral valve prolapse) - CBC with Differential/Platelet - Comprehensive metabolic panel - Lipid panel - TSH  3. Prostate cancer screening  - PSA  4.  Mixed hyperlipidemia - Consider increase in statin - CBC with Differential/Platelet - Comprehensive metabolic panel - Lipid panel - TSH  5. Colon cancer screening  - Ambulatory referral to Gastroenterology  Dorothyann Peng, NP

## 2019-05-10 ENCOUNTER — Telehealth: Payer: Self-pay | Admitting: *Deleted

## 2019-05-10 ENCOUNTER — Other Ambulatory Visit: Payer: Self-pay | Admitting: Adult Health

## 2019-05-10 MED ORDER — PENICILLIN V POTASSIUM 500 MG PO TABS: 500.0000 mg | ORAL_TABLET | Freq: Three times a day (TID) | ORAL | 0 refills | Status: AC

## 2019-05-10 NOTE — Telephone Encounter (Signed)
Penicillin has been sent to the pharmacy, I would advise him to wait until early next week to start taking it and that is only if he has a toothache.  Follow-up with his dentist

## 2019-05-10 NOTE — Telephone Encounter (Signed)
Patient called office stating that he had a physical yesterday with Tommi Rumps and last night he developed a toothache and is worried about infection. Patient wanted to know if Tommi Rumps could send him in an antibiotic. Please advise

## 2019-05-10 NOTE — Telephone Encounter (Signed)
Patient returned call to office to check on question about medication. Note read to patient with Cory's recommendations. Patient verbalized understanding.

## 2019-05-13 MED ORDER — ATORVASTATIN CALCIUM 20 MG PO TABS: 20.0000 mg | ORAL_TABLET | Freq: Every day | ORAL | 0 refills | Status: DC

## 2019-05-13 NOTE — Addendum Note (Signed)
Addended by: Agnes Lawrence on: 05/13/2019 11:32 AM   Modules accepted: Orders

## 2019-07-24 IMAGING — DX DG CHEST 1V PORT
1 series · 2 of 2 positions shown · non-contrast
Comparison: 01/23/2017

CLINICAL DATA: Post op mini invasive valve replacement, ETT< OGT<
IJ

EXAM:
PORTABLE CHEST 1 VIEW

[Series 1: chest · 0.14mm/px · 2 of 2 slices shown]
[im 1/2]
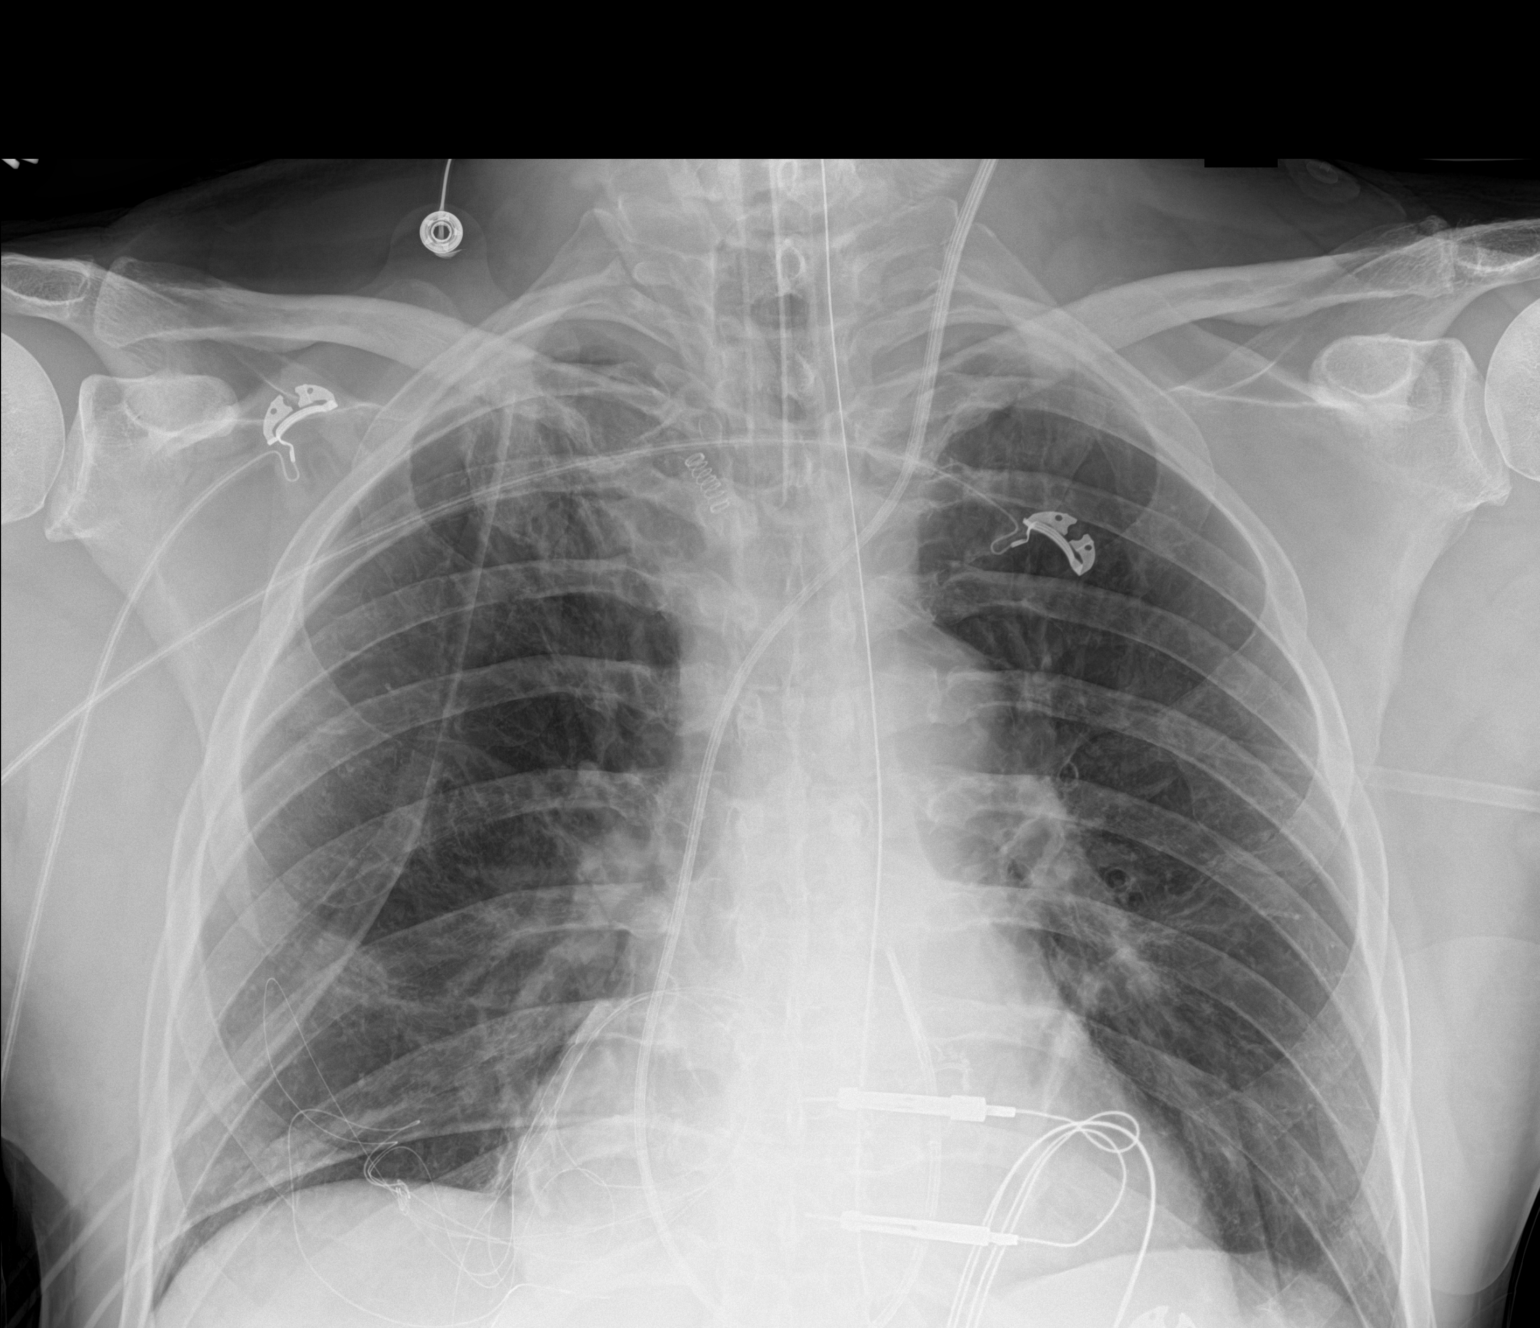
[im 2/2]
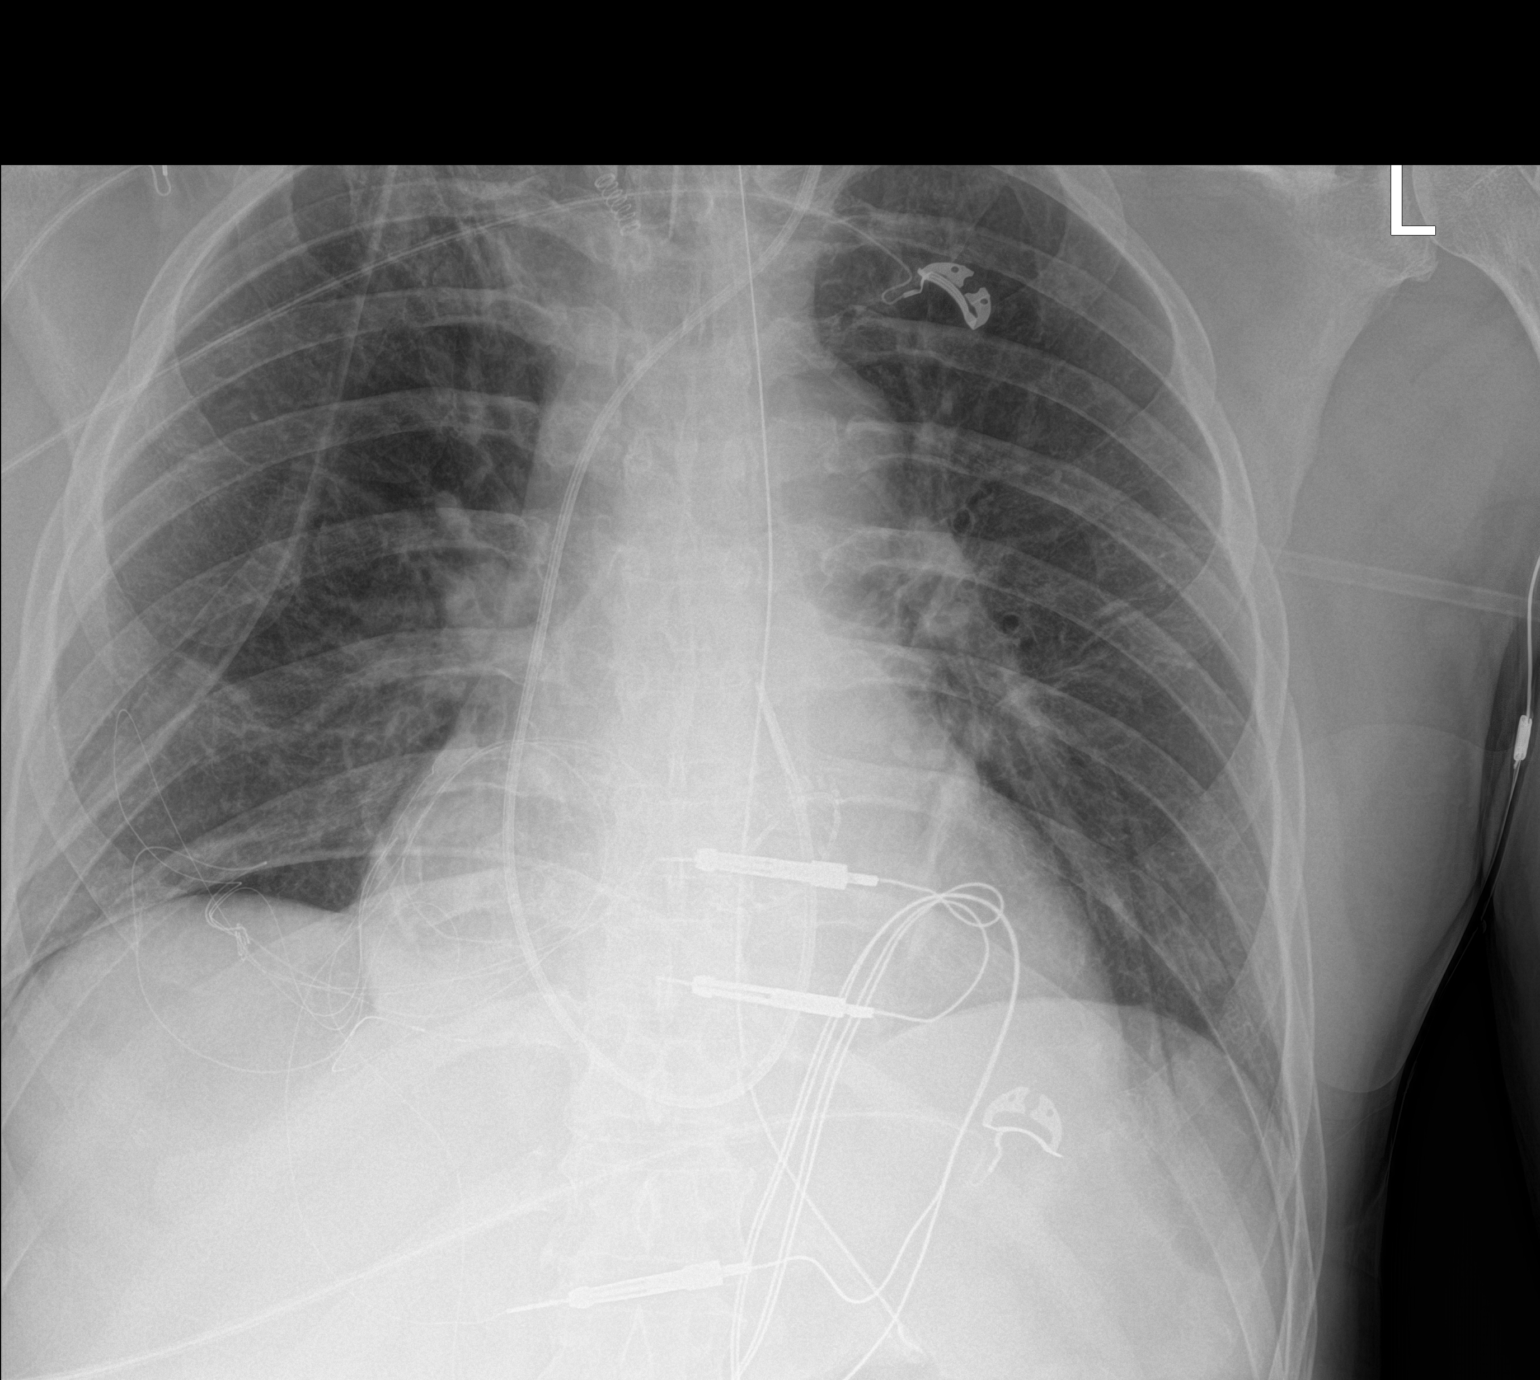

[2 of 2 positions shown; findings below may reference images not displayed]

FINDINGS: Endotracheal tube in 6.5 cm from carina. NG tube in stomach.
Swan-Ganz catheter tip in the main pulmonary artery. Two RIGHT chest
tubes in place.

No pneumothorax.  No pulmonary edema.
IMPRESSION: 1. Support apparatus in good position.
2. RIGHT chest tubes in place without pneumothorax.
3. No pulmonary edema.

## 2019-09-17 ENCOUNTER — Encounter: Payer: Self-pay | Admitting: Adult Health

## 2019-09-17 ENCOUNTER — Other Ambulatory Visit: Payer: Self-pay

## 2019-09-17 ENCOUNTER — Ambulatory Visit: Payer: Managed Care, Other (non HMO) | Admitting: Adult Health

## 2019-09-17 VITALS — BP 126/94 | Temp 98.2°F | Wt 177.0 lb

## 2019-09-17 DIAGNOSIS — E782 Mixed hyperlipidemia: Secondary | ICD-10-CM | POA: Diagnosis not present

## 2019-09-17 DIAGNOSIS — M791 Myalgia, unspecified site: Secondary | ICD-10-CM | POA: Diagnosis not present

## 2019-09-17 LAB — HEPATIC FUNCTION PANEL
ALT: 22 U/L (ref 0–53)
AST: 18 U/L (ref 0–37)
Albumin: 4.2 g/dL (ref 3.5–5.2)
Alkaline Phosphatase: 74 U/L (ref 39–117)
Bilirubin, Direct: 0.1 mg/dL (ref 0.0–0.3)
Total Bilirubin: 0.8 mg/dL (ref 0.2–1.2)
Total Protein: 6.5 g/dL (ref 6.0–8.3)

## 2019-09-17 LAB — LIPID PANEL
Cholesterol: 167 mg/dL (ref 0–200)
HDL: 58.3 mg/dL (ref >39.00)
LDL Cholesterol: 96 mg/dL (ref 0–99)
NonHDL: 108.87
Total CHOL/HDL Ratio: 3
Triglycerides: 63 mg/dL (ref 0.0–149.0)
VLDL: 12.6 mg/dL (ref 0.0–40.0)

## 2019-09-17 MED ORDER — ATORVASTATIN CALCIUM 20 MG PO TABS: 20.0000 mg | ORAL_TABLET | ORAL | 3 refills | Status: DC

## 2019-09-17 NOTE — Progress Notes (Signed)
Subjective:    Patient ID: Alan Mcguire, male    DOB: [birth year], 64 y.o.   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  He presents to the office today for follow-up regarding hyperlipidemia.  When he was last seen in February 2020 at his CPE he was switched from simvastatin 10 mg to Lipitor 20 mg as there was a question about myalgia intermittently in his calves.  Since starting Lipitor he reports that when he took it daily it caused myalgia. He switched to taking it every other day and has noticed improvement in his symptoms.    Review of Systems See HPI   Past Medical History:  Diagnosis Date  . Chronic lower back pain   . Lumbar disc disease    L4-5  . Murmur 9/92011   echo-MVP posterior leaflet with moderate mitral insufficiency and the jet directed anteriorly,left atrium mildly enlarged  . MVP (mitral valve prolapse)   . Non-rheumatic mitral regurgitation   . Pneumonia 1990s   hx walking pneumonia  . 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   Complex valvuloplasty including triangular resection of flail segment of posterior leaflet, artificial Gore-tex neochord placement x4 and 34 mm Sorin Memo 3D ring annuloplasty via right mini thoracotomy approach    Social History   Socioeconomic History  . Marital status: Married    Spouse name: Not on file  . Number of children: Not on file  . Years of education: Not on file  . Highest education level: Not on file  Occupational History  . Not on file  Tobacco Use  . Smoking status: Never Smoker  . Smokeless tobacco: Never Used  Vaping Use  . Vaping Use: Never used  Substance and Sexual Activity  . Alcohol use: No  . Drug use: No  . Sexual activity: Not on file  Other  Topics Concern  . Not on file  Social History Narrative   Materials engineer company    Married    4 kids ( 2 in college and 2 married)    He likes to play golf and work in the yard.    He coaches swimming   Social Determinants of Radio broadcast assistant Strain:   . Difficulty of Paying Living Expenses:   Food Insecurity:   . Worried About Charity fundraiser in the Last Year:   . Arboriculturist in the Last Year:   Transportation Needs:   . Film/video editor (Medical):   Marland Kitchen Lack of Transportation (Non-Medical):   Physical Activity:   . Days of Exercise per Week:   . Minutes of Exercise per Session:   Stress:   . Feeling of Stress :   Social Connections:   . Frequency of Communication with Friends and Family:   . Frequency of Social Gatherings with Friends and Family:   . Attends Religious Services:   . Active Member of Clubs or Organizations:   . Attends Archivist Meetings:   Marland Kitchen Marital Status:   Intimate Partner Violence:   . Fear of Current or Ex-Partner:   . Emotionally Abused:   Marland Kitchen Physically Abused:   . Sexually Abused:     Past Surgical History:  Procedure Laterality Date  . CARDIAC CATHETERIZATION    .  COLONOSCOPY    . MITRAL VALVE REPAIR Right 01/24/2017   Procedure: MINIMALLY INVASIVE MITRAL VALVE REPAIR (MVR);  Surgeon: Rexene Alberts, MD;  Location: [facility];  Service: Open Heart Surgery;  Laterality: Right;  . RIGHT/LEFT HEART CATH AND CORONARY ANGIOGRAPHY N/A 10/28/2016   Procedure: Right/Left Heart Cath and Coronary Angiography;  Surgeon: [name], [name], MD;  Location: [facility];  Service: Cardiovascular;  Laterality: N/A;  . TEE WITHOUT CARDIOVERSION N/A 09/21/2016   Procedure: TRANSESOPHAGEAL ECHOCARDIOGRAM (TEE);  Surgeon: Skeet Latch, MD;  Location: [facility];  Service: Cardiovascular;  Laterality: N/A;  . TEE WITHOUT CARDIOVERSION N/A 01/24/2017   Procedure: TRANSESOPHAGEAL ECHOCARDIOGRAM (TEE);  Surgeon:  Rexene Alberts, MD;  Location: [facility];  Service: Open Heart Surgery;  Laterality: N/A;  . VASECTOMY    . WISDOM TOOTH EXTRACTION      Family History  Problem Relation Age of Onset  . Kidney failure Father        died  . Kidney disease Father   . Hypertension Mother   . Lung cancer Mother   . Colon cancer Neg Hx     Allergies  Allergen Reactions  . Bee Venom Anaphylaxis    Current Outpatient Medications on File Prior to Visit  Medication Sig Dispense Refill  . aspirin 81 MG tablet Take 81 mg by mouth daily.    Marland Kitchen atorvastatin (LIPITOR) 20 MG tablet Take 1 tablet (20 mg total) by mouth daily. 90 tablet 0  . Multiple Vitamin (MULTIVITAMIN) capsule Take 1 capsule by mouth 3 (three) times a week.     . simvastatin (ZOCOR) 10 MG tablet TAKE 1 TABLET BY MOUTH AT BEDTIME 90 tablet 0   No current facility-administered medications on file prior to visit.    There were no vitals taken for this visit.      Objective:   Physical Exam Vitals and nursing note reviewed.  Constitutional:      Appearance: Normal appearance.  Cardiovascular:     Rate and Rhythm: Normal rate and regular rhythm.     Pulses: Normal pulses.     Heart sounds: Normal heart sounds.  Pulmonary:     Effort: Pulmonary effort is normal.     Breath sounds: Normal breath sounds.  Musculoskeletal:        General: Normal range of motion.  Skin:    General: Skin is warm and dry.     Capillary Refill: Capillary refill takes less than 2 seconds.  Neurological:     General: No focal deficit present.     Mental Status: He is alert and oriented to person, place, and time.  Psychiatric:        Mood and Affect: Mood normal.        Behavior: Behavior normal.        Thought Content: Thought content normal.       Assessment & Plan:  1. Mixed hyperlipidemia  - Lipid panel - Hepatic function panel - atorvastatin (LIPITOR) 20 MG tablet; Take 1 tablet (20 mg total) by mouth every other day.  Dispense: 45 tablet;  Refill: 3  2. Myalgia - He is ok with continuing Lipitor 20 mg every other day  - Lipid panel - Hepatic function panel   Dorothyann Peng, NP

## 2019-10-01 ENCOUNTER — Other Ambulatory Visit: Payer: Self-pay | Admitting: Adult Health

## 2019-10-01 ENCOUNTER — Telehealth: Payer: Self-pay | Admitting: Adult Health

## 2019-10-01 MED ORDER — DICLOFENAC SODIUM 1 % EX GEL: 2.0000 g | Freq: Four times a day (QID) | CUTANEOUS | 1 refills | Status: DC | PRN

## 2019-10-01 NOTE — Telephone Encounter (Signed)
The patient called in reference to a topical cream for his muscle pain that Tommi Rumps was supposed to call in for him at his visit July 6.   I did not see any notation for a Rx for a muscle cream being sent in at that DOS.  [facility] - [place], [place] - [address] Phone:  [phone]  Fax:  [phone]

## 2019-10-01 NOTE — Telephone Encounter (Signed)
Pt notified that rx has been sent to the pharmacy. 

## 2019-10-01 NOTE — Telephone Encounter (Signed)
I must have  misunderstood him, Voltaren can be bought over-the-counter as well but I will go ahead and send it in right now

## 2019-10-28 ENCOUNTER — Telehealth: Payer: Self-pay | Admitting: Adult Health

## 2019-10-28 NOTE — Telephone Encounter (Signed)
Pt called in to see if he should have something called in for his sore throat and stuffy nose. Has been lingering for a while.  Please advise

## 2019-10-29 ENCOUNTER — Encounter: Payer: Self-pay | Admitting: Adult Health

## 2019-10-29 ENCOUNTER — Telehealth (INDEPENDENT_AMBULATORY_CARE_PROVIDER_SITE_OTHER): Payer: Managed Care, Other (non HMO) | Admitting: Adult Health

## 2019-10-29 ENCOUNTER — Other Ambulatory Visit: Payer: Self-pay

## 2019-10-29 VITALS — Wt 175.0 lb

## 2019-10-29 DIAGNOSIS — J069 Acute upper respiratory infection, unspecified: Secondary | ICD-10-CM

## 2019-10-29 MED ORDER — DOXYCYCLINE HYCLATE 100 MG PO CAPS: 100.0000 mg | ORAL_CAPSULE | Freq: Two times a day (BID) | ORAL | 0 refills | Status: DC

## 2019-10-29 NOTE — Progress Notes (Signed)
Virtual Visit via Video Note  I connected with Alan Mcguire  on 10/29/19 at  5:00 PM EDT by a video enabled telemedicine application and verified that I am speaking with the correct person using two identifiers.  Location patient: home Location provider:work or home office Persons participating in the virtual visit: patient, provider  I discussed the limitations of evaluation and management by telemedicine and the availability of in person appointments. The patient expressed understanding and agreed to proceed.   HPI: 64 year old male who is being evaluated today for an acute issue.  His symptoms started 4 to 5 days ago.  Symptoms include congestion, and a scratchy irritated throat.  His grandson whom he was around was sick with an upper respiratory infection and his wife had developed symptoms prior to [place].  His wife was treated with doxycycline and improved.  He does report some mild improvement in his symptoms but has not resolved.  He denies nausea, vomiting, diarrhea, fevers, chills, shortness of breath, or chest pain.  He has been vaccinated against COVID-19   ROS: See pertinent positives and negatives per HPI.  Past Medical History:  Diagnosis Date  . Chronic lower back pain   . Lumbar disc disease    L4-5  . Murmur 9/92011   echo-MVP posterior leaflet with moderate mitral insufficiency and the jet directed anteriorly,left atrium mildly enlarged  . MVP (mitral valve prolapse)   . Non-rheumatic mitral regurgitation   . Pneumonia 1990s   hx walking pneumonia  . 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   Complex valvuloplasty including triangular resection of flail segment of posterior leaflet, artificial Gore-tex neochord placement x4 and 34 mm Sorin Memo 3D ring annuloplasty via right mini thoracotomy approach    Past Surgical History:  Procedure Laterality Date  . CARDIAC CATHETERIZATION    . COLONOSCOPY    . MITRAL VALVE REPAIR Right 01/24/2017    Procedure: MINIMALLY INVASIVE MITRAL VALVE REPAIR (MVR);  Surgeon: Rexene Alberts, MD;  Location: [facility];  Service: Open Heart Surgery;  Laterality: Right;  . RIGHT/LEFT HEART CATH AND CORONARY ANGIOGRAPHY N/A 10/28/2016   Procedure: Right/Left Heart Cath and Coronary Angiography;  Surgeon: [name], [name], MD;  Location: [facility];  Service: Cardiovascular;  Laterality: N/A;  . TEE WITHOUT CARDIOVERSION N/A 09/21/2016   Procedure: TRANSESOPHAGEAL ECHOCARDIOGRAM (TEE);  Surgeon: Skeet Latch, MD;  Location: [facility];  Service: Cardiovascular;  Laterality: N/A;  . TEE WITHOUT CARDIOVERSION N/A 01/24/2017   Procedure: TRANSESOPHAGEAL ECHOCARDIOGRAM (TEE);  Surgeon: Rexene Alberts, MD;  Location: [facility];  Service: Open Heart Surgery;  Laterality: N/A;  . VASECTOMY    . WISDOM TOOTH EXTRACTION      Family History  Problem Relation Age of Onset  . Kidney failure Father        died  . Kidney disease Father   . Hypertension Mother   . Lung cancer Mother   . Colon cancer Neg Hx        Current Outpatient Medications:  .  aspirin 81 MG tablet, Take 81 mg by mouth daily., Disp: , Rfl:  .  atorvastatin (LIPITOR) 20 MG tablet, Take 1 tablet (20 mg total) by mouth every other day., Disp: 45 tablet, Rfl: 3 .  diclofenac Sodium (VOLTAREN) 1 % GEL, Apply 2 g topically 4 (four) times daily as needed., Disp: 100 g, Rfl: 1 .  Multiple Vitamin (MULTIVITAMIN) capsule, Take 1 capsule by mouth 3 (three) times a week. , Disp: , Rfl:  EXAM:  VITALS per patient if applicable:  GENERAL: alert, oriented, appears well and in no acute distress  HEENT: atraumatic, conjunttiva clear, no obvious abnormalities on inspection of external nose and ears  NECK: normal movements of the head and neck  LUNGS: on inspection no signs of respiratory distress, breathing rate appears normal, no obvious gross SOB, gasping or wheezing  CV: no obvious cyanosis  MS: moves all visible extremities without  noticeable abnormality  PSYCH/NEURO: pleasant and cooperative, no obvious depression or anxiety, speech and thought processing grossly intact  ASSESSMENT AND PLAN:  Discussed the following assessment and plan:  1. Upper respiratory tract infection, unspecified type -Send in doxycycline.  He was advised to see how he is feeling tomorrow and if he continues to improve and does not need the doxycycline.  Continue to stay hydrated and rest.  Follow-up if needed - doxycycline (VIBRAMYCIN) 100 MG capsule; Take 1 capsule (100 mg total) by mouth 2 (two) times daily.  Dispense: 14 capsule; Refill: 0     I discussed the assessment and treatment plan with the patient. The patient was provided an opportunity to ask questions and all were answered. The patient agreed with the plan and demonstrated an understanding of the instructions.   The patient was advised to call back or seek an in-person evaluation if the symptoms worsen or if the condition fails to improve as anticipated.   Dorothyann Peng, NP

## 2019-10-29 NOTE — Telephone Encounter (Signed)
Pt scheduled for virtual appointment to see [facility].

## 2019-11-14 NOTE — Progress Notes (Signed)
Alan Mcguire Date of Birth: 05-17-1955 Medical Record #[id number]  History of Present Illness: Clearnce is seen  for followup of mitral valve prolapse s/p MV repair. Last seen March 2019.  He has a history of mitral valve prolapse with moderate mitral insufficiency. He was seen in 2018 with  worsening murmur. Echo was done suggesting severe MR. TEE was then performed and showed MVP with severe MR. He subsequently underwent cardiac cath showing normal coronary anatomy and LV function. Normal right heart pressures. He underwent minimally invasive mitral valve repair on 01/24/2017 with complex valvuloplasty including triangular resection of the flail segment of the posterior leaflet. His surgical course progressed appropriately and he was diuresed following the procedure. He maintained NSR throughout admission. Hgb was stable at 13.3 at the time of discharge. On follow up he did complain of some fatigue and metoprolol dose was reduced. Follow up Echo showed a good repair. He has been switched from Coumadin to ASA.   On follow up today he states he is doing very well. He denies any dyspnea, palpitations, or chest pain. 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   Current Outpatient Medications on File Prior to Visit  Medication Sig Dispense Refill  . aspirin 81 MG tablet Take 81 mg by mouth daily.    Marland Kitchen atorvastatin (LIPITOR) 20 MG tablet Take 1 tablet (20 mg total) by mouth every other day. 45 tablet 3  . diclofenac Sodium (VOLTAREN) 1 % GEL Apply 2 g topically 4 (four) times daily as needed. 100 g 1  . Multiple Vitamin (MULTIVITAMIN) capsule Take 1 capsule by mouth 3 (three) times a week.     . doxycycline (VIBRAMYCIN) 100 MG capsule Take 1 capsule (100 mg total) by mouth 2 (two) times daily. (Patient not taking: Reported on 11/15/2019) 14 capsule 0   No current facility-administered medications on file prior to visit.    Allergies  Allergen  Reactions  . Bee Venom Anaphylaxis    Past Medical History:  Diagnosis Date  . Chronic lower back pain   . Lumbar disc disease    L4-5  . Murmur 9/92011   echo-MVP posterior leaflet with moderate mitral insufficiency and the jet directed anteriorly,left atrium mildly enlarged  . MVP (mitral valve prolapse)   . Non-rheumatic mitral regurgitation   . Pneumonia 1990s   hx walking pneumonia  . 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   Complex valvuloplasty including triangular resection of flail segment of posterior leaflet, artificial Gore-tex neochord placement x4 and 34 mm Sorin Memo 3D ring annuloplasty via right mini thoracotomy approach    Past Surgical History:  Procedure Laterality Date  . CARDIAC CATHETERIZATION    . COLONOSCOPY    . MITRAL VALVE REPAIR Right 01/24/2017   Procedure: MINIMALLY INVASIVE MITRAL VALVE REPAIR (MVR);  Surgeon: Rexene Alberts, MD;  Location: [facility];  Service: Open Heart Surgery;  Laterality: Right;  . RIGHT/LEFT HEART CATH AND CORONARY ANGIOGRAPHY N/A 10/28/2016   Procedure: Right/Left Heart Cath and Coronary Angiography;  Surgeon: [name], Cabrina Shiroma M, MD;  Location: [facility];  Service: Cardiovascular;  Laterality: N/A;  . TEE WITHOUT CARDIOVERSION N/A 09/21/2016   Procedure: TRANSESOPHAGEAL ECHOCARDIOGRAM (TEE);  Surgeon: Skeet Latch, MD;  Location: [facility];  Service: Cardiovascular;  Laterality: N/A;  . TEE WITHOUT CARDIOVERSION N/A 01/24/2017   Procedure: TRANSESOPHAGEAL ECHOCARDIOGRAM (TEE);  Surgeon: Rexene Alberts, MD;  Location: [facility];  Service: Open Heart  Surgery;  Laterality: N/A;  . VASECTOMY    . WISDOM TOOTH EXTRACTION      Social History   Tobacco Use  Smoking Status Never Smoker  Smokeless Tobacco Never Used    Social History   Substance and Sexual Activity  Alcohol Use No    Family History  Problem Relation Age of Onset  . Kidney failure Father        died  . Kidney disease Father   .  Hypertension Mother   . Lung cancer Mother   . Colon cancer Neg Hx     Review of Systems: As noted in history of present illness .  All other systems were reviewed and are negative.  Physical Exam: BP 140/80 (BP Location: Left Arm, Patient Position: Sitting, Cuff Size: Normal)   Pulse 76   Ht 5\' 11"  (1.803 m)   Wt 183 lb (83 kg)   BMI 25.52 kg/m  GENERAL:  Well appearing, WM in NAD HEENT:  PERRL, EOMI, sclera are clear. Oropharynx is clear. NECK:  No jugular venous distention, carotid upstroke brisk and symmetric, no bruits, no thyromegaly or adenopathy LUNGS:  Clear to auscultation bilaterally CHEST:  Surgical incisions have healed very well. HEART:  RRR,  PMI not displaced or sustained,S1 and S2 within normal limits, no S3, no S4: no clicks, no rubs, no murmurs ABD:  Soft, nontender. BS +, no masses or bruits. No hepatomegaly, no splenomegaly EXT:  2 + pulses throughout, no edema, no cyanosis no clubbing SKIN:  Warm and dry.  No rashes NEURO:  Alert and oriented x 3. Cranial nerves II through XII intact. PSYCH:  Cognitively intact    LABORATORY DATA:  Lab Results  Component Value Date   WBC 5.3 05/09/2019   HGB 16.8 05/09/2019   HCT 50.8 05/09/2019   PLT 247.0 05/09/2019   GLUCOSE 92 05/09/2019   CHOL 167 09/17/2019   TRIG 63.0 09/17/2019   HDL 58.30 09/17/2019   LDLDIRECT 123.4 06/20/2011   LDLCALC 96 09/17/2019   ALT 22 09/17/2019   AST 18 09/17/2019   NA 140 05/09/2019   K 4.4 05/09/2019   CL 102 05/09/2019   CREATININE 1.10 05/09/2019   BUN 16 05/09/2019   CO2 32 05/09/2019   TSH 2.59 05/09/2019   PSA 1.38 05/09/2019   INR 2.4 05/11/2017   HGBA1C 5.3 01/23/2017   Ecg today shows NSR with normal Ecg. I have personally reviewed and interpreted this study.   Echo: 09/08/16: Study Conclusions  - Left ventricle: The cavity size was mildly dilated. Systolic   function was normal. The estimated ejection fraction was in the   range of 60% to 65%. Wall  motion was normal; there were no   regional wall motion abnormalities. Features are consistent with   a pseudonormal left ventricular filling pattern, with concomitant   abnormal relaxation and increased filling pressure (grade 2   diastolic dysfunction). Doppler parameters are consistent with   elevated ventricular end-diastolic filling pressure. - Aortic valve: There was mild regurgitation. - Mitral valve: There was moderate to severe regurgitation. - Left atrium: The atrium was moderately dilated. - Right ventricle: Systolic function was normal. - Tricuspid valve: There was moderate regurgitation. - Pulmonary arteries: Systolic pressure was mildly increased. PA   peak pressure: 43 mm Hg (S).  Impressions:  - Myxomatous, thickened mitral valve leaflets with bileaflet   prolapse posterior >> anterior. There is anteriorly directed jet,   mitral regurgitation is at least moderate to severe,  most   probably severe, there is systolic anterior flow reversal in the   pulmonary vein, moderate pulmonary hypertension.   A TEE is recommended for further evaluation.   TEE 09/21/16:Study Conclusions  - Left ventricle: Systolic function was normal. The estimated   ejection fraction was in the range of 60% to 65%. Wall motion was   normal; there were no regional wall motion abnormalities. - Aortic valve: There was mild regurgitation. - Mitral valve: Severe thickening of the anterior leaflet and   posterior leaflet, consistent with myxomatous proliferation.   Severe, late systolic prolapse, involving the posterior leaflet   and mild prolapse of the anterior leaflet. There was severe,   mild-late systolic regurgitation directed eccentrically and   toward the septum. Severe regurgitation is suggested by an   effective regurgitant orifice >= 0.40 cm^2, a regurgitant volume   >= 60 ml, pulmonary vein systolic flow reversal, and Coanda   effect. Late systolic flow reversal was noted in the  right upper   pulmonary vein. Valve area by continuity equation (using LVOT   flow): 1.78 cm^2. - Left atrium: No evidence of thrombus in the atrial cavity or   appendage. No evidence of thrombus in the atrial cavity or   appendage. - Right atrium: No evidence of thrombus in the atrial cavity or   appendage. - Atrial septum: No defect or patent foramen ovale was identified   by color flow Doppler.  Cardiac cath 10/28/16: Procedures   Right/Left Heart Cath and Coronary Angiography  Conclusion     The left ventricular systolic function is normal.  The left ventricular ejection fraction is 55-65% by visual estimate.  There is no aortic valve stenosis.  There is severe (4+) mitral regurgitation and moderate mitral valve prolapse.  Normal coronary anatomy   1. Normal coronary anatomy 2. Normal LV function 3. Normal right heart pressures. 4. Normal PCWP with mildly elevated LVEDP 5. Normal cardiac output.   Plan: consider for MV repair.     Echo 05/15/17: Study Conclusions  - Left ventricle: The cavity size was normal. Wall thickness was   increased in a pattern of mild LVH. Systolic function was normal.   The estimated ejection fraction was in the range of 50% to 55%.   Wall motion was normal; there were no regional wall motion   abnormalities. Doppler parameters are consistent with abnormal   left ventricular relaxation (grade 1 diastolic dysfunction).   Doppler parameters are consistent with high ventricular filling   pressure. - Aortic valve: There was trivial regurgitation. - Mitral valve: Prior procedures included surgical repair.  Impressions:  - Normal LV systolic function; mild diastolic dysfunction; mild   LVH; trace AI; s/p MV repair with mean gradient of 3 mmHg and no   MR.   Assessment / Plan: 1. Mitral valve prolapse with severe mitral insufficiency by Echo and TEE. Evidence of pulmonary HTN and LAE by Echo.  Now s/p MV repair with excellent result  clinically and by Echo.Exam is normal today.  Continue ASA.

## 2019-11-15 ENCOUNTER — Ambulatory Visit: Payer: Managed Care, Other (non HMO) | Admitting: Cardiology

## 2019-11-15 ENCOUNTER — Encounter: Payer: Self-pay | Admitting: Cardiology

## 2019-11-15 ENCOUNTER — Other Ambulatory Visit: Payer: Self-pay

## 2019-11-15 VITALS — BP 140/80 | HR 76 | Ht 71.0 in | Wt 183.0 lb

## 2019-11-15 DIAGNOSIS — I341 Nonrheumatic mitral (valve) prolapse: Secondary | ICD-10-CM

## 2019-11-15 DIAGNOSIS — Z9889 Other specified postprocedural states: Secondary | ICD-10-CM | POA: Diagnosis not present

## 2020-05-08 ENCOUNTER — Ambulatory Visit (INDEPENDENT_AMBULATORY_CARE_PROVIDER_SITE_OTHER): Payer: Managed Care, Other (non HMO) | Admitting: Adult Health

## 2020-05-08 ENCOUNTER — Encounter: Payer: Self-pay | Admitting: Adult Health

## 2020-05-08 ENCOUNTER — Other Ambulatory Visit: Payer: Self-pay

## 2020-05-08 VITALS — BP 126/80 | Temp 98.4°F | Ht 71.5 in | Wt 191.0 lb

## 2020-05-08 DIAGNOSIS — E782 Mixed hyperlipidemia: Secondary | ICD-10-CM | POA: Diagnosis not present

## 2020-05-08 DIAGNOSIS — M549 Dorsalgia, unspecified: Secondary | ICD-10-CM | POA: Diagnosis not present

## 2020-05-08 DIAGNOSIS — I341 Nonrheumatic mitral (valve) prolapse: Secondary | ICD-10-CM | POA: Diagnosis not present

## 2020-05-08 DIAGNOSIS — Z23 Encounter for immunization: Secondary | ICD-10-CM

## 2020-05-08 DIAGNOSIS — Z0000 Encounter for general adult medical examination without abnormal findings: Secondary | ICD-10-CM

## 2020-05-08 DIAGNOSIS — Z125 Encounter for screening for malignant neoplasm of prostate: Secondary | ICD-10-CM

## 2020-05-08 LAB — COMPREHENSIVE METABOLIC PANEL
ALT: 21 U/L (ref 0–53)
AST: 17 U/L (ref 0–37)
Albumin: 4.1 g/dL (ref 3.5–5.2)
Alkaline Phosphatase: 84 U/L (ref 39–117)
BUN: 15 mg/dL (ref 6–23)
CO2: 30 mEq/L (ref 19–32)
Calcium: 9.2 mg/dL (ref 8.4–10.5)
Chloride: 104 mEq/L (ref 96–112)
Creatinine, Ser: 1.08 mg/dL (ref 0.40–1.50)
GFR: 72.82 mL/min (ref >60.00)
Glucose, Bld: 93 mg/dL (ref 70–99)
Potassium: 4 mEq/L (ref 3.5–5.1)
Sodium: 141 mEq/L (ref 135–145)
Total Bilirubin: 1 mg/dL (ref 0.2–1.2)
Total Protein: 7 g/dL (ref 6.0–8.3)

## 2020-05-08 LAB — LIPID PANEL
Cholesterol: 185 mg/dL (ref 0–200)
HDL: 57.5 mg/dL (ref >39.00)
LDL Cholesterol: 111 mg/dL — ABNORMAL HIGH (ref 0–99)
NonHDL: 127.65
Total CHOL/HDL Ratio: 3
Triglycerides: 84 mg/dL (ref 0.0–149.0)
VLDL: 16.8 mg/dL (ref 0.0–40.0)

## 2020-05-08 LAB — CBC WITH DIFFERENTIAL/PLATELET
Basophils Absolute: 0.1 10*3/uL (ref 0.0–0.1)
Basophils Relative: 1.8 % (ref 0.0–3.0)
Eosinophils Absolute: 0.1 10*3/uL (ref 0.0–0.7)
Eosinophils Relative: 2.6 % (ref 0.0–5.0)
HCT: 46.5 % (ref 39.0–52.0)
Hemoglobin: 15.5 g/dL (ref 13.0–17.0)
Lymphocytes Relative: 20.1 % (ref 12.0–46.0)
Lymphs Abs: 0.9 10*3/uL (ref 0.7–4.0)
MCHC: 33.2 g/dL (ref 30.0–36.0)
MCV: 85.1 fl (ref 78.0–100.0)
Monocytes Absolute: 0.4 10*3/uL (ref 0.1–1.0)
Monocytes Relative: 8.7 % (ref 3.0–12.0)
Neutro Abs: 3 10*3/uL (ref 1.4–7.7)
Neutrophils Relative %: 66.8 % (ref 43.0–77.0)
Platelets: 230 10*3/uL (ref 150.0–400.0)
RBC: 5.46 Mil/uL (ref 4.22–5.81)
RDW: 13.3 % (ref 11.5–15.5)
WBC: 4.5 10*3/uL (ref 4.0–10.5)

## 2020-05-08 LAB — PSA: PSA: 2.12 ng/mL (ref 0.10–4.00)

## 2020-05-08 LAB — TSH: TSH: 2.78 u[IU]/mL (ref 0.35–4.50)

## 2020-05-08 NOTE — Progress Notes (Signed)
Subjective:    Patient ID: Alan Mcguire, male    DOB: [birth year], 65 y.o.   MRN: [id number]  HPI Patient presents for yearly preventative medicine examination. He is a very pleasant 65 year old male who  has a past medical history of Chronic lower back pain, Lumbar disc disease, Murmur (9/92011), MVP (mitral valve prolapse), Non-rheumatic mitral regurgitation, Pneumonia (1990s), and S/P minimally invasive mitral valve repair (01/24/2017).  Mitral valve prolapse- s/p repair in November 2018.  He has done well since his surgery.  Denies shortness of breath, DOE, chest pain, or or exercise tolerance.  Is seen by cardiology yearly.  Takes an 81 mg aspirin  Hyperlipidemia-takes Lipitor 20mg  every other day. With this regimen he does not have any myalgias or fatigue  Lab Results  Component Value Date   CHOL 167 09/17/2019   HDL 58.30 09/17/2019   LDLCALC 96 09/17/2019   LDLDIRECT 123.4 06/20/2011   TRIG 63.0 09/17/2019   CHOLHDL 3 09/17/2019   Mid back pain -been present since roughly 2020.  He was seen in January 2020, his symptoms were present for about a month.  Noticed pain around his left  shoulder blade when he moved his arm in certain directions.  This was felt as a stretching sensation.  At this time it is thought to be a muscle strain, was prescribed Flexeril and a Medrol Dosepak.  He reports that he does not believe he had any improvement from this.  Continues to have discomfort in the same area.  Still with certain range of motion as well as sitting certain ways.  Does not impede activity   All immunizations and health maintenance protocols were reviewed with the patient and needed orders were placed.  Appropriate screening laboratory values were ordered for the patient including screening of hyperlipidemia, renal function and hepatic function. If indicated by BPH, a PSA was ordered.  Medication reconciliation,  past medical history, social history, problem list and allergies  were reviewed in detail with the patient  Goals were established with regard to weight loss, exercise, and  diet in compliance with medications. He has been doing weight training.  Wt Readings from Last 3 Encounters:  05/08/20 191 lb (86.6 kg)  11/15/19 183 lb (83 kg)  10/29/19 175 lb (79.4 kg)   Due for colon cancer screening in July 2022.  Review of Systems  Constitutional: Negative.   HENT: Negative.   Eyes: Negative.   Respiratory: Negative.   Cardiovascular: Negative.   Gastrointestinal: Negative.   Endocrine: Negative.   Genitourinary: Negative.   Musculoskeletal: Negative.   Skin: Negative.   Allergic/Immunologic: Negative.   Neurological: Negative.   Hematological: Negative.   Psychiatric/Behavioral: Negative.   All other systems reviewed and are negative.  Past Medical History:  Diagnosis Date  . Chronic lower back pain   . Lumbar disc disease    L4-5  . Murmur 9/92011   echo-MVP posterior leaflet with moderate mitral insufficiency and the jet directed anteriorly,left atrium mildly enlarged  . MVP (mitral valve prolapse)   . Non-rheumatic mitral regurgitation   . Pneumonia 1990s   hx walking pneumonia  . 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   Complex valvuloplasty including triangular resection of flail segment of posterior leaflet, artificial Gore-tex neochord placement x4 and 34 mm Sorin Memo 3D ring annuloplasty via right mini thoracotomy approach    Social History   Socioeconomic History  . Marital status: Married    Spouse name: Not on file  .  Number of children: Not on file  . Years of education: Not on file  . Highest education level: Not on file  Occupational History  . Not on file  Tobacco Use  . Smoking status: Never Smoker  . Smokeless tobacco: Never Used  Vaping Use  . Vaping Use: Never used  Substance and Sexual Activity  . Alcohol use: No  . Drug use: No  . Sexual activity: Not on file  Other Topics Concern  . Not  on file  Social History Narrative   Materials engineer company    Married    4 kids ( 2 in college and 2 married)    He likes to play golf and work in the yard.    He coaches swimming   Social Determinants of Radio broadcast assistant Strain: Not on file  Food Insecurity: Not on file  Transportation Needs: Not on file  Physical Activity: Not on file  Stress: Not on file  Social Connections: Not on file  Intimate Partner Violence: Not on file    Past Surgical History:  Procedure Laterality Date  . CARDIAC CATHETERIZATION    . COLONOSCOPY    . MITRAL VALVE REPAIR Right 01/24/2017   Procedure: MINIMALLY INVASIVE MITRAL VALVE REPAIR (MVR);  Surgeon: Rexene Alberts, MD;  Location: [facility];  Service: Open Heart Surgery;  Laterality: Right;  . RIGHT/LEFT HEART CATH AND CORONARY ANGIOGRAPHY N/A 10/28/2016   Procedure: Right/Left Heart Cath and Coronary Angiography;  Surgeon: [name], [name], MD;  Location: [facility];  Service: Cardiovascular;  Laterality: N/A;  . TEE WITHOUT CARDIOVERSION N/A 09/21/2016   Procedure: TRANSESOPHAGEAL ECHOCARDIOGRAM (TEE);  Surgeon: Skeet Latch, MD;  Location: [facility];  Service: Cardiovascular;  Laterality: N/A;  . TEE WITHOUT CARDIOVERSION N/A 01/24/2017   Procedure: TRANSESOPHAGEAL ECHOCARDIOGRAM (TEE);  Surgeon: Rexene Alberts, MD;  Location: [facility];  Service: Open Heart Surgery;  Laterality: N/A;  . VASECTOMY    . WISDOM TOOTH EXTRACTION      Family History  Problem Relation Age of Onset  . Kidney failure Father        died  . Kidney disease Father   . Hypertension Mother   . Lung cancer Mother   . Colon cancer Neg Hx     Allergies  Allergen Reactions  . Bee Venom Anaphylaxis    Current Outpatient Medications on File Prior to Visit  Medication Sig Dispense Refill  . aspirin 81 MG tablet Take 81 mg by mouth daily.    . Multiple Vitamin (MULTIVITAMIN) capsule Take 1 capsule by mouth 3 (three) times a week.     Marland Kitchen  atorvastatin (LIPITOR) 20 MG tablet Take 1 tablet (20 mg total) by mouth every other day. 45 tablet 3   No current facility-administered medications on file prior to visit.    BP 126/80   Temp 98.4 F (36.9 C)   Ht 5' 11.5" (1.816 m) Comment: WITH SHOES  Wt 191 lb (86.6 kg)   BMI 26.27 kg/m       Objective:   Physical Exam Vitals and nursing note reviewed.  Constitutional:      General: He is not in acute distress.    Appearance: Normal appearance. He is well-developed and normal weight.  HENT:     Head: Normocephalic and atraumatic.     Right Ear: Tympanic membrane, ear canal and external ear normal. There is no impacted cerumen.     Left Ear: Tympanic membrane, ear canal  and external ear normal. There is no impacted cerumen.     Nose: Nose normal. No congestion or rhinorrhea.     Mouth/Throat:     Mouth: Mucous membranes are moist.     Pharynx: Oropharynx is clear. No oropharyngeal exudate or posterior oropharyngeal erythema.  Eyes:     General:        Right eye: No discharge.        Left eye: No discharge.     Extraocular Movements: Extraocular movements intact.     Conjunctiva/sclera: Conjunctivae normal.     Pupils: Pupils are equal, round, and reactive to light.  Neck:     Vascular: No carotid bruit.     Trachea: No tracheal deviation.  Cardiovascular:     Rate and Rhythm: Normal rate and regular rhythm.     Pulses: Normal pulses.     Heart sounds: Normal heart sounds. No murmur heard. No friction rub. No gallop.   Pulmonary:     Effort: Pulmonary effort is normal. No respiratory distress.     Breath sounds: Normal breath sounds. No stridor. No wheezing, rhonchi or rales.  Chest:     Chest wall: No tenderness.  Abdominal:     General: Bowel sounds are normal. There is no distension.     Palpations: Abdomen is soft. There is no mass.     Tenderness: There is no abdominal tenderness. There is no right CVA tenderness, left CVA tenderness, guarding or rebound.      Hernia: No hernia is present.  Musculoskeletal:        General: Swelling and tenderness present. No deformity or signs of injury. Normal range of motion.     Thoracic back: Swelling and tenderness present. No bony tenderness.       Back:     Right lower leg: No edema.     Left lower leg: No edema.     Comments: Does have some discomfort with palpation as well as soft tissue swelling below his left shoulder blade.  No mass or lipoma felt  Lymphadenopathy:     Cervical: No cervical adenopathy.  Skin:    General: Skin is warm and dry.     Capillary Refill: Capillary refill takes less than 2 seconds.     Coloration: Skin is not jaundiced or pale.     Findings: No bruising, erythema, lesion or rash.  Neurological:     General: No focal deficit present.     Mental Status: He is alert and oriented to person, place, and time.     Cranial Nerves: No cranial nerve deficit.     Sensory: No sensory deficit.     Motor: No weakness.     Coordination: Coordination normal.     Gait: Gait normal.     Deep Tendon Reflexes: Reflexes normal.  Psychiatric:        Mood and Affect: Mood normal.        Behavior: Behavior normal.        Thought Content: Thought content normal.        Judgment: Judgment normal.       Assessment & Plan:  1. Routine general medical examination at a health care facility - Continue to exercise and eat healthy  - Follow up in one year or sooner if needed - CBC with Differential/Platelet; Future - Comprehensive metabolic panel; Future - Lipid panel; Future - TSH; Future - TSH - Lipid panel - Comprehensive metabolic panel - CBC with Differential/Platelet  2.  MVP (mitral valve prolapse) - Doing very well. Follow up with Cardiology in September 2022 - CBC with Differential/Platelet; Future - Comprehensive metabolic panel; Future - Lipid panel; Future - TSH; Future - TSH - Lipid panel - Comprehensive metabolic panel - CBC with Differential/Platelet  3.  Prostate cancer screening  - PSA; Future - PSA  4. Mixed hyperlipidemia - Continue with Lipitor 20 mg every other day  - CBC with Differential/Platelet; Future - Comprehensive metabolic panel; Future - Lipid panel; Future - TSH; Future - TSH - Lipid panel - Comprehensive metabolic panel - CBC with Differential/Platelet  5. Mid back pain on left side -Continues to feel muscular in origin.  Will refer to sports medicine for further evaluation - Ambulatory referral to [place], NP

## 2020-05-08 NOTE — Patient Instructions (Signed)
It was great seeing you today!   We will follow up with you regarding your blood work   Someone from sports medicine will call you to schedule an appointment   I will see you back in one year or sooner if needed

## 2020-05-21 NOTE — Progress Notes (Signed)
   Subjective:   I, [name], LAT, ATC acting as a scribe for Alan Leader, MD.  I'm seeing this patient as a consultation for Alan Peng, NP. Note will be routed back to referring provider/PCP.  CC: Left-side thoracic back pain  HPI: Pt is a 65 y/o male c/o L-sided thoracic back pain ongoing since approximately 2020 w/ no known MOI. Pt notes that he plays golf and works out. Pt locates pain to L scapular region along the medial border of the scapula.   Pt notes he doesn't like the way the atorvastatin makes his joints feel.   Neck pain: no Radiates: no UE Numbness/tingling: no UE Weakness: no Aggravates: trunk rotation, overhead press Treatments tried: heating pad  Past medical history, Surgical history, Family history, Social history, Allergies, and medications have been entered into the medical record, reviewed.   Review of Systems: No new headache, visual changes, nausea, vomiting, diarrhea, constipation, dizziness, abdominal pain, skin rash, fevers, chills, night sweats, weight loss, swollen lymph nodes, body aches, joint swelling, muscle aches, chest pain, shortness of breath, mood changes, visual or auditory hallucinations.   Objective:    Vitals:   05/22/20 1036  BP: (!) 142/90  Pulse: 84  SpO2: 95%   General: Well Developed, well nourished, and in no acute distress.  Neuro/Psych: Alert and oriented x3, extra-ocular muscles intact, able to move all 4 extremities, sensation grossly intact. Skin: Warm and dry, no rashes noted.  Respiratory: Not using accessory muscles, speaking in full sentences, trachea midline.  Cardiovascular: Pulses palpable, no extremity edema. Abdomen: Does not appear distended. MSK: C-spine normal-appearing Thoracic back nontender midline. Tender palpation left thoracic paraspinal musculature. Subcutaneous mass overlying left scapula nontender mobile and consistent in appearance to palpation with lipoma. Normal scapular motion  bilaterally. Upper extremity strength reflexes and sensation are equal normal throughout. Lumbar spine normal.  Nontender normal lumbar motion.    Impression and Recommendations:    Assessment and Plan: 65 y.o. male with left thoracic periscapular perispinal muscle pain.  Plan for physical therapy.  Patient is a wonderful physical therapy candidate.  Discussed medications both patient and myself think the muscle relaxers are very likely not going to be very helpful.  Recommend also heating pad and TENS unit.  Patient does have a mass overlying his left scapula which is consistent with a lipoma.  Watchful waiting for now we will proceed with imaging if needed.  Recheck in about 6 to 8 weeks.Marland Kitchen  PDMP not reviewed this encounter. Orders Placed This Encounter  Procedures  . Ambulatory referral to Physical Therapy    Referral Priority:   Routine    Referral Type:   Physical Medicine    Referral Reason:   Specialty Services Required    Requested Specialty:   Physical Therapy    Number of Visits Requested:   1   No orders of the defined types were placed in this encounter.   Discussed warning signs or symptoms. Please see discharge instructions. Patient expresses understanding.   The above documentation has been reviewed and is accurate and complete Alan Mcguire, M.D.

## 2020-05-22 ENCOUNTER — Other Ambulatory Visit: Payer: Self-pay

## 2020-05-22 ENCOUNTER — Ambulatory Visit: Payer: Managed Care, Other (non HMO) | Admitting: Family Medicine

## 2020-05-22 VITALS — BP 142/90 | HR 84 | Ht 71.5 in | Wt 191.2 lb

## 2020-05-22 DIAGNOSIS — S29012A Strain of muscle and tendon of back wall of thorax, initial encounter: Secondary | ICD-10-CM | POA: Diagnosis not present

## 2020-05-22 NOTE — Patient Instructions (Addendum)
Thank you for coming in today.  I've referred you to Physical Therapy.  Let us know if you don't hear from them in one week.  Use heating pad and TENS unit.   Recheck as needed.   Ask Tommi Rumps about Pitivastatin or Crestor  TENS UNIT: This is helpful for muscle pain and spasm.   Search and Purchase a TENS 7000 2nd edition at  www.tenspros.com or www.[place].com It should be less than $30.     TENS unit instructions: Do not shower or bathe with the unit on . Turn the unit off before removing electrodes or batteries . If the electrodes lose stickiness add a drop of water to the electrodes after they are disconnected from the unit and place on plastic sheet. If you continued to have difficulty, call the TENS unit company to purchase more electrodes. . Do not apply lotion on the skin area prior to use. Make sure the skin is clean and dry as this will help prolong the life of the electrodes. . After use, always check skin for unusual red areas, rash or other skin difficulties. If there are any skin problems, does not apply electrodes to the same area. . Never remove the electrodes from the unit by pulling the wires. . Do not use the TENS unit or electrodes other than as directed. . Do not change electrode placement without consultating your therapist or physician. Marland Kitchen Keep 2 fingers with between each electrode. . Wear time ratio is 2:1, on to off times.    For example on for 30 minutes off for 15 minutes and then on for 30 minutes off for 15 minutes

## 2020-07-14 ENCOUNTER — Ambulatory Visit: Payer: Managed Care, Other (non HMO) | Admitting: Family Medicine

## 2020-08-07 ENCOUNTER — Other Ambulatory Visit: Payer: Self-pay | Admitting: Adult Health

## 2020-08-07 DIAGNOSIS — E782 Mixed hyperlipidemia: Secondary | ICD-10-CM

## 2020-12-13 ENCOUNTER — Encounter: Payer: Self-pay | Admitting: Gastroenterology

## 2021-02-10 ENCOUNTER — Other Ambulatory Visit: Payer: Self-pay | Admitting: Family

## 2021-02-10 ENCOUNTER — Telehealth: Payer: Self-pay

## 2021-02-10 ENCOUNTER — Other Ambulatory Visit (HOSPITAL_COMMUNITY): Payer: Self-pay

## 2021-02-10 ENCOUNTER — Telehealth: Payer: Managed Care, Other (non HMO) | Admitting: Family

## 2021-02-10 DIAGNOSIS — R6889 Other general symptoms and signs: Secondary | ICD-10-CM | POA: Diagnosis not present

## 2021-02-10 MED ORDER — MOLNUPIRAVIR EUA 200MG CAPSULE
4.0000 | ORAL_CAPSULE | Freq: Two times a day (BID) | ORAL | 0 refills | Status: AC
  Filled 2021-02-10: qty 40, 5d supply, fill #0

## 2021-02-10 MED ORDER — OSELTAMIVIR PHOSPHATE 75 MG PO CAPS: 75.0000 mg | ORAL_CAPSULE | Freq: Two times a day (BID) | ORAL | 0 refills | Status: DC

## 2021-02-10 NOTE — Patient Instructions (Signed)
Influenza, Adult °Influenza, also called "the flu," is a viral infection that mainly affects the respiratory tract. This includes the lungs, nose, and throat. The flu spreads easily from person to person (is contagious). It causes common cold symptoms, along with high fever and body aches. °What are the causes? °This condition is caused by the influenza virus. You can get the virus by: °Breathing in droplets that are in the air from an infected person's cough or sneeze. °Touching something that has the virus on it (has been contaminated) and then touching your mouth, nose, or eyes. °What increases the risk? °The following factors may make you more likely to get the flu: °Not washing or sanitizing your hands often. °Having close contact with many people during cold and flu season. °Touching your mouth, eyes, or nose without first washing or sanitizing your hands. °Not getting an annual flu shot. °You may have a higher risk for the flu, including serious problems, such as a lung infection (pneumonia), if you: °Are older than 65. °Are pregnant. °Have a weakened disease-fighting system (immune system). This includes people who have HIV or AIDS, are on chemotherapy, or are taking medicines that reduce (suppress) the immune system. °Have a long-term (chronic) illness, such as heart disease, kidney disease, diabetes, or lung disease. °Have a liver disorder. °Are severely overweight (morbidly obese). °Have anemia. °Have asthma. °What are the signs or symptoms? °Symptoms of this condition usually begin suddenly and last 4-14 days. These may include: °Fever and chills. °Headaches, body aches, or muscle aches. °Sore throat. °Cough. °Runny or stuffy (congested) nose. °Chest discomfort. °Poor appetite. °Weakness or fatigue. °Dizziness. °Nausea or vomiting. °How is this diagnosed? °This condition may be diagnosed based on: °Your symptoms and medical history. °A physical exam. °Swabbing your nose or throat and testing the fluid  for the influenza virus. °How is this treated? °If the flu is diagnosed early, you can be treated with antiviral medicine that is given by mouth (orally) or through an IV. This can help reduce how severe the illness is and how long it lasts. °Taking care of yourself at home can help relieve symptoms. Your health care provider may recommend: °Taking over-the-counter medicines. °Drinking plenty of fluids. °In many cases, the flu goes away on its own. If you have severe symptoms or complications, you may be treated in a hospital. °Follow these instructions at home: °Activity °Rest as needed and get plenty of sleep. °Stay home from work or school as told by your health care provider. Unless you are visiting your health care provider, avoid leaving home until your fever has been gone for 24 hours without taking medicine. °Eating and drinking °Take an oral rehydration solution (ORS). This is a drink that is sold at pharmacies and retail stores. °Drink enough fluid to keep your urine pale yellow. °Drink clear fluids in small amounts as you are able. Clear fluids include water, ice chips, fruit juice mixed with water, and low-calorie sports drinks. °Eat bland, easy-to-digest foods in small amounts as you are able. These foods include bananas, applesauce, rice, lean meats, toast, and crackers. °Avoid drinking fluids that contain a lot of sugar or caffeine, such as energy drinks, regular sports drinks, and soda. °Avoid alcohol. °Avoid spicy or fatty foods. °General instructions °  °Take over-the-counter and prescription medicines only as told by your health care provider. °Use a cool mist humidifier to add humidity to the air in your home. This can make it easier to breathe. °When using a cool mist humidifier,   clean it daily. Empty the water and replace it with clean water. °Cover your mouth and nose when you cough or sneeze. °Wash your hands with soap and water often and for at least 20 seconds, especially after you cough or  sneeze. If soap and water are not available, use alcohol-based hand sanitizer. °Keep all follow-up visits. This is important. °How is this prevented? ° °Get an annual flu shot. This is usually available in late summer, fall, or winter. Ask your health care provider when you should get your flu shot. °Avoid contact with people who are sick during cold and flu season. This is generally fall and winter. °Contact a health care provider if: °You develop new symptoms. °You have: °Chest pain. °Diarrhea. °A fever. °Your cough gets worse. °You produce more mucus. °You feel nauseous or you vomit. °Get help right away if you: °Develop shortness of breath or have difficulty breathing. °Have skin or nails that turn a bluish color. °Have severe pain or stiffness in your neck. °Develop a sudden headache or sudden pain in your face or ear. °Cannot eat or drink without vomiting. °These symptoms may represent a serious problem that is an emergency. Do not wait to see if the symptoms will go away. Get medical help right away. Call your local emergency services (911 in the [place] Do not drive yourself to the hospital. °Summary °Influenza, also called "the flu," is a viral infection that primarily affects your respiratory tract. °Symptoms of the flu usually begin suddenly and last 4-14 days. °Getting an annual flu shot is the best way to prevent getting the flu. °Stay home from work or school as told by your health care provider. Unless you are visiting your health care provider, avoid leaving home until your fever has been gone for 24 hours without taking medicine. °Keep all follow-up visits. This is important. °This information is not intended to replace advice given to you by your health care provider. Make sure you discuss any questions you have with your health care provider. °Document Revised: 10/18/2019 Document Reviewed: 10/18/2019 °Elsevier Patient Education © 2022 [employer] ° °

## 2021-02-10 NOTE — Telephone Encounter (Signed)
Patient called and stated home Covid test results were positive and he did not pick up Rx because he didn't know if a different Rx needed to be sent to the pharmacy

## 2021-02-10 NOTE — Progress Notes (Signed)
Virtual Visit Consent   Alan Mcguire, you are scheduled for a virtual visit with a [facility] provider today.     Just as with appointments in the office, your consent must be obtained to participate.  Your consent will be active for this visit and any virtual visit you may have with one of our providers in the next 365 days.     If you have a MyChart account, a copy of this consent can be sent to you electronically.  All virtual visits are billed to your insurance company just like a traditional visit in the office.    As this is a virtual visit, video technology does not allow for your provider to perform a traditional examination.  This may limit your provider's ability to fully assess your condition.  If your provider identifies any concerns that need to be evaluated in person or the need to arrange testing (such as labs, EKG, etc.), we will make arrangements to do so.     Although advances in technology are sophisticated, we cannot ensure that it will always work on either your end or our end.  If the connection with a video visit is poor, the visit may have to be switched to a telephone visit.  With either a video or telephone visit, we are not always able to ensure that we have a secure connection.     I need to obtain your verbal consent now.   Are you willing to proceed with your visit today?    Alan Mcguire has provided verbal consent on 02/10/2021 for a virtual visit (video or telephone).   Evelina Dun, FNP   Date: 02/10/2021 9:12 AM   Virtual Visit via Video Note   I, Evelina Dun, connected with  Alan Mcguire  ([id number], April 06, 1955) on 02/10/21 at  9:15 AM EST by a video-enabled telemedicine application and verified that I am speaking with the correct person using two identifiers.  Location: Patient: Virtual Visit Location Patient: Home Provider: Virtual Visit Location Provider: Home Office   I discussed the limitations of evaluation and management by  telemedicine and the availability of in person appointments. The patient expressed understanding and agreed to proceed.    History of Present Illness: Alan Mcguire is a 65 y.o. who identifies as a male who was assigned male at birth, and is being seen today for flu like symptoms. He reports over the last two days he has been having fever, chills, cough, and sore throat.   HPI: Influenza This is a new problem. The current episode started in the past 7 days. Associated symptoms include chills, congestion, coughing, fatigue, a fever (102), headaches, myalgias and a sore throat. He has tried acetaminophen, sleep and rest for the symptoms. The treatment provided mild relief.   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 , Disp: , Rfl:   Observations/Objective: Patient is well-developed, well-nourished in no acute distress.  Resting comfortably  at home in bed. Head is normocephalic, atraumatic.  No labored breathing.  Speech is clear and coherent with logical content.  Patient is alert  and oriented at baseline.  Nasal congestion, hoarse voice  Assessment and Plan: 1. Flu-like symptoms - oseltamivir (TAMIFLU) 75 MG capsule; Take 1 capsule (75 mg total) by mouth 2 (two) times daily.  Dispense: 10 capsule; Refill: 0 PT will take home COVID test to rule out. If negative he will start Tamiflu Rest Force fluids Alternate tylenol and motrin Mucinex as needed   Follow Up Instructions: I discussed the assessment and treatment plan with the patient. The patient was provided an opportunity to ask questions and all were answered. The patient agreed with the plan and demonstrated an understanding of the instructions.  A copy of instructions were sent to the patient via MyChart unless otherwise noted below.     The patient was advised to call back or seek an in-person evaluation if the symptoms worsen or if the condition fails to improve as anticipated.  Time:  I spent 8 minutes with the patient via telehealth technology discussing the above problems/concerns.    Evelina Dun, FNP

## 2021-02-11 NOTE — Telephone Encounter (Signed)
Called patient and told him to start [place].   Evelina Dun, FNP

## 2021-02-17 ENCOUNTER — Encounter: Payer: Self-pay | Admitting: Adult Health

## 2021-02-18 ENCOUNTER — Other Ambulatory Visit (HOSPITAL_COMMUNITY): Payer: Self-pay

## 2021-02-25 NOTE — Progress Notes (Signed)
[name] Date of Birth: 1956/01/06 Medical Record #[id number]  History of Present Illness: Alan Mcguire is seen  for followup of mitral valve prolapse s/p MV repair. Last seen March 2019.  He has a history of mitral valve prolapse with moderate mitral insufficiency. He was seen in 2018 with  worsening murmur. Echo was done suggesting severe MR. TEE was then performed and showed MVP with severe MR. He subsequently underwent cardiac cath showing normal coronary anatomy and LV function. Normal right heart pressures. He underwent minimally invasive mitral valve repair on 01/24/2017 with complex valvuloplasty including triangular resection of the flail segment of the posterior leaflet. His surgical course progressed appropriately and he was diuresed following the procedure. He maintained NSR throughout admission. Hgb was stable at 13.3 at the time of discharge. On follow up he did complain of some fatigue and metoprolol dose was reduced. Follow up Echo showed a good repair. He has been switched from Coumadin to ASA.   On follow up today he states he is doing very well. He denies any dyspnea, palpitations, or chest pain. He remains active.  Current Outpatient Medications on File Prior to Visit  Medication Sig Dispense Refill   aspirin 81 MG tablet Take 81 mg by mouth daily.     atorvastatin (LIPITOR) 20 MG tablet TAKE 1 TABLET BY MOUTH EVERY OTHER DAY 45 tablet 3   Multiple Vitamin (MULTIVITAMIN) capsule Take 1 capsule by mouth 3 (three) times a week.      No current facility-administered medications on file prior to visit.    Allergies  Allergen Reactions   Bee Venom Anaphylaxis    Past Medical History:  Diagnosis Date   Chronic lower back pain    Lumbar disc disease    L4-5   Murmur 9/92011   echo-MVP posterior leaflet with moderate mitral insufficiency and the jet directed anteriorly,left atrium mildly enlarged   MVP (mitral valve prolapse)    Non-rheumatic mitral regurgitation     Pneumonia 1990s   hx walking pneumonia   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   Complex valvuloplasty including triangular resection of flail segment of posterior leaflet, artificial Gore-tex neochord placement x4 and 34 mm Sorin Memo 3D ring annuloplasty via right mini thoracotomy approach    Past Surgical History:  Procedure Laterality Date   CARDIAC CATHETERIZATION     COLONOSCOPY     MITRAL VALVE REPAIR Right 01/24/2017   Procedure: MINIMALLY INVASIVE MITRAL VALVE REPAIR (MVR);  Surgeon: Rexene Alberts, MD;  Location: [facility];  Service: Open Heart Surgery;  Laterality: Right;   RIGHT/LEFT HEART CATH AND CORONARY ANGIOGRAPHY N/A 10/28/2016   Procedure: Right/Left Heart Cath and Coronary Angiography;  Surgeon: [name], Anita Laguna M, MD;  Location: [facility];  Service: Cardiovascular;  Laterality: N/A;   TEE WITHOUT CARDIOVERSION N/A 09/21/2016   Procedure: TRANSESOPHAGEAL ECHOCARDIOGRAM (TEE);  Surgeon: Skeet Latch, MD;  Location: [facility];  Service: Cardiovascular;  Laterality: N/A;   TEE WITHOUT CARDIOVERSION N/A 01/24/2017   Procedure: TRANSESOPHAGEAL ECHOCARDIOGRAM (TEE);  Surgeon: Rexene Alberts, MD;  Location: [facility];  Service: Open Heart Surgery;  Laterality: N/A;   VASECTOMY     WISDOM TOOTH EXTRACTION      Social History   Tobacco Use  Smoking Status Never  Smokeless Tobacco Never    Social History   Substance and Sexual Activity  Alcohol Use No    Family History  Problem Relation Age of Onset   Kidney failure Father  died   Kidney disease Father    Hypertension Mother    Lung cancer Mother    Colon cancer Neg Hx     Review of Systems: As noted in history of present illness .  All other systems were reviewed and are negative.  Physical Exam: BP 120/86 (BP Location: Left Arm)    Pulse 92    Ht 5\' 11"  (1.803 m)    Wt 181 lb 9.6 oz (82.4 kg)    SpO2 94%    BMI 25.33 kg/m  GENERAL:  Well appearing, WM in NAD HEENT:  PERRL,  EOMI, sclera are clear. Oropharynx is clear. NECK:  No jugular venous distention, carotid upstroke brisk and symmetric, no bruits, no thyromegaly or adenopathy LUNGS:  Clear to auscultation bilaterally CHEST:  Surgical incisions have healed very well. HEART:  RRR,  PMI not displaced or sustained,S1 and S2 within normal limits, no S3, no S4: no clicks, no rubs, no murmurs ABD:  Soft, nontender. BS +, no masses or bruits. No hepatomegaly, no splenomegaly EXT:  2 + pulses throughout, no edema, no cyanosis no clubbing SKIN:  Warm and dry.  No rashes NEURO:  Alert and oriented x 3. Cranial nerves II through XII intact. PSYCH:  Cognitively intact    LABORATORY DATA:  Lab Results  Component Value Date   WBC 4.5 05/08/2020   HGB 15.5 05/08/2020   HCT 46.5 05/08/2020   PLT 230.0 05/08/2020   GLUCOSE 93 05/08/2020   CHOL 185 05/08/2020   TRIG 84.0 05/08/2020   HDL 57.50 05/08/2020   LDLDIRECT 123.4 06/20/2011   LDLCALC 111 (H) 05/08/2020   ALT 21 05/08/2020   AST 17 05/08/2020   NA 141 05/08/2020   K 4.0 05/08/2020   CL 104 05/08/2020   CREATININE 1.08 05/08/2020   BUN 15 05/08/2020   CO2 30 05/08/2020   TSH 2.78 05/08/2020   PSA 2.12 05/08/2020   INR 2.4 05/11/2017   HGBA1C 5.3 01/23/2017   Ecg today shows NSR with occ PVC. Otherwise normal. I have personally reviewed and interpreted this study.   Echo: 09/08/16: Study Conclusions   - Left ventricle: The cavity size was mildly dilated. Systolic   function was normal. The estimated ejection fraction was in the   range of 60% to 65%. Wall motion was normal; there were no   regional wall motion abnormalities. Features are consistent with   a pseudonormal left ventricular filling pattern, with concomitant   abnormal relaxation and increased filling pressure (grade 2   diastolic dysfunction). Doppler parameters are consistent with   elevated ventricular end-diastolic filling pressure. - Aortic valve: There was mild  regurgitation. - Mitral valve: There was moderate to severe regurgitation. - Left atrium: The atrium was moderately dilated. - Right ventricle: Systolic function was normal. - Tricuspid valve: There was moderate regurgitation. - Pulmonary arteries: Systolic pressure was mildly increased. PA   peak pressure: 43 mm Hg (S).   Impressions:   - Myxomatous, thickened mitral valve leaflets with bileaflet   prolapse posterior >> anterior. There is anteriorly directed jet,   mitral regurgitation is at least moderate to severe, most   probably severe, there is systolic anterior flow reversal in the   pulmonary vein, moderate pulmonary hypertension.   A TEE is recommended for further evaluation.    TEE 09/21/16:Study Conclusions   - Left ventricle: Systolic function was normal. The estimated   ejection fraction was in the range of 60% to 65%. Wall motion was  normal; there were no regional wall motion abnormalities. - Aortic valve: There was mild regurgitation. - Mitral valve: Severe thickening of the anterior leaflet and   posterior leaflet, consistent with myxomatous proliferation.   Severe, late systolic prolapse, involving the posterior leaflet   and mild prolapse of the anterior leaflet. There was severe,   mild-late systolic regurgitation directed eccentrically and   toward the septum. Severe regurgitation is suggested by an   effective regurgitant orifice >= 0.40 cm^2, a regurgitant volume   >= 60 ml, pulmonary vein systolic flow reversal, and Coanda   effect. Late systolic flow reversal was noted in the right upper   pulmonary vein. Valve area by continuity equation (using LVOT   flow): 1.78 cm^2. - Left atrium: No evidence of thrombus in the atrial cavity or   appendage. No evidence of thrombus in the atrial cavity or   appendage. - Right atrium: No evidence of thrombus in the atrial cavity or   appendage. - Atrial septum: No defect or patent foramen ovale was identified   by  color flow Doppler.  Cardiac cath 10/28/16: Procedures   Right/Left Heart Cath and Coronary Angiography  Conclusion     The left ventricular systolic function is normal. The left ventricular ejection fraction is 55-65% by visual estimate. There is no aortic valve stenosis. There is severe (4+) mitral regurgitation and moderate mitral valve prolapse. Normal coronary anatomy   1. Normal coronary anatomy 2. Normal LV function 3. Normal right heart pressures. 4. Normal PCWP with mildly elevated LVEDP 5. Normal cardiac output.    Plan: consider for MV repair.      Echo 05/15/17: Study Conclusions   - Left ventricle: The cavity size was normal. Wall thickness was   increased in a pattern of mild LVH. Systolic function was normal.   The estimated ejection fraction was in the range of 50% to 55%.   Wall motion was normal; there were no regional wall motion   abnormalities. Doppler parameters are consistent with abnormal   left ventricular relaxation (grade 1 diastolic dysfunction).   Doppler parameters are consistent with high ventricular filling   pressure. - Aortic valve: There was trivial regurgitation. - Mitral valve: Prior procedures included surgical repair.   Impressions:   - Normal LV systolic function; mild diastolic dysfunction; mild   LVH; trace AI; s/p MV repair with mean gradient of 3 mmHg and no   MR.   Assessment / Plan: 1. Mitral valve prolapse with severe mitral insufficiency by Echo and TEE. Evidence of pulmonary HTN and LAE by Echo.  Now s/p MV repair with excellent result clinically and by Echo. Exam is normal today.  Continue ASA. Follow up in one  year.

## 2021-03-01 ENCOUNTER — Ambulatory Visit: Payer: Managed Care, Other (non HMO) | Admitting: Cardiology

## 2021-03-01 ENCOUNTER — Other Ambulatory Visit: Payer: Self-pay

## 2021-03-01 ENCOUNTER — Encounter: Payer: Self-pay | Admitting: Cardiology

## 2021-03-01 VITALS — BP 120/86 | HR 92 | Ht 71.0 in | Wt 181.6 lb

## 2021-03-01 DIAGNOSIS — I341 Nonrheumatic mitral (valve) prolapse: Secondary | ICD-10-CM | POA: Diagnosis not present

## 2021-04-20 ENCOUNTER — Encounter: Payer: Self-pay | Admitting: Gastroenterology

## 2021-05-11 ENCOUNTER — Encounter: Payer: Managed Care, Other (non HMO) | Admitting: Adult Health

## 2021-05-11 NOTE — Progress Notes (Unsigned)
Subjective:    Patient ID: Alan Mcguire, male    DOB: [birth year], 66 y.o.   MRN: [id number]  HPI Patient presents for yearly preventative medicine examination. He is a pleasant 66 year old male who  has a past medical history of Chronic lower back pain, Lumbar disc disease, Murmur (9/92011), MVP (mitral valve prolapse), Non-rheumatic mitral regurgitation, Pneumonia (1990s), and S/P minimally invasive mitral valve repair (01/24/2017).   Mitral Valve Prolapse -repair in November 2018.  Denies shortness of breath, DOE, chest pain, or exercise intolerance.  He is seen by cardiology yearly.  Takes aspirin 81 mg daily  Hyperlipidemia -takes Lipitor 20 mg every other day.  Denies myalgia or fatigue with this regimen.   All immunizations and health maintenance protocols were reviewed with the patient and needed orders were placed.  Appropriate screening laboratory values were ordered for the patient including screening of hyperlipidemia, renal function and hepatic function. If indicated by BPH, a PSA was ordered.  Medication reconciliation,  past medical history, social history, problem list and allergies were reviewed in detail with the patient  Goals were established with regard to weight loss, exercise, and  diet in compliance with medications  He is scheduled for his colonoscopy at the end of March 2023  Review of Systems  Constitutional: Negative.   HENT: Negative.    Eyes: Negative.   Respiratory: Negative.    Cardiovascular: Negative.   Gastrointestinal: Negative.   Endocrine: Negative.   Genitourinary: Negative.   Musculoskeletal: Negative.   Skin: Negative.   Allergic/Immunologic: Negative.   Neurological: Negative.   Hematological: Negative.   Psychiatric/Behavioral: Negative.    All other systems reviewed and are negative.   Past Medical History:  Diagnosis Date   Chronic lower back pain    Lumbar disc disease    L4-5   Murmur 9/92011   echo-MVP posterior  leaflet with moderate mitral insufficiency and the jet directed anteriorly,left atrium mildly enlarged   MVP (mitral valve prolapse)    Non-rheumatic mitral regurgitation    Pneumonia 1990s   hx walking pneumonia   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   Complex valvuloplasty including triangular resection of flail segment of posterior leaflet, artificial Gore-tex neochord placement x4 and 34 mm Sorin Memo 3D ring annuloplasty via right mini thoracotomy approach    Social History   Socioeconomic History   Marital status: Married    Spouse name: Not on file   Number of children: Not on file   Years of education: Not on file   Highest education level: Not on file  Occupational History   Not on file  Tobacco Use   Smoking status: Never   Smokeless tobacco: Never  Vaping Use   Vaping Use: Never used  Substance and Sexual Activity   Alcohol use: No   Drug use: No   Sexual activity: Not on file  Other Topics Concern   Not on file  Social History Narrative   [employer] engineer company    Married    4 kids ( 2 in college and 2 married)    He likes to play golf and work in the yard.    He coaches swimming   Social Determinants of Radio broadcast assistant Strain: Not on file  Food Insecurity: Not on file  Transportation Needs: Not on file  Physical Activity: Not on file  Stress: Not on file  Social Connections: Not on file  Intimate Partner Violence: Not on file  Past Surgical History:  Procedure Laterality Date   CARDIAC CATHETERIZATION     COLONOSCOPY     MITRAL VALVE REPAIR Right 01/24/2017   Procedure: MINIMALLY INVASIVE MITRAL VALVE REPAIR (MVR);  Surgeon: Rexene Alberts, MD;  Location: [place];  Service: Open Heart Surgery;  Laterality: Right;   RIGHT/LEFT HEART CATH AND CORONARY ANGIOGRAPHY N/A 10/28/2016   Procedure: Right/Left Heart Cath and Coronary Angiography;  Surgeon: [name], [name], MD;  Location: [facility];  Service:  Cardiovascular;  Laterality: N/A;   TEE WITHOUT CARDIOVERSION N/A 09/21/2016   Procedure: TRANSESOPHAGEAL ECHOCARDIOGRAM (TEE);  Surgeon: Skeet Latch, MD;  Location: [place];  Service: Cardiovascular;  Laterality: N/A;   TEE WITHOUT CARDIOVERSION N/A 01/24/2017   Procedure: TRANSESOPHAGEAL ECHOCARDIOGRAM (TEE);  Surgeon: Rexene Alberts, MD;  Location: [place];  Service: Open Heart Surgery;  Laterality: N/A;   VASECTOMY     WISDOM TOOTH EXTRACTION      Family History  Problem Relation Age of Onset   Kidney failure Father        died   Kidney disease Father    Hypertension Mother    Lung cancer Mother    Colon cancer Neg Hx     Allergies  Allergen Reactions   Bee Venom Anaphylaxis    Current Outpatient Medications on File Prior to Visit  Medication Sig Dispense Refill   aspirin 81 MG tablet Take 81 mg by mouth daily.     atorvastatin (LIPITOR) 20 MG tablet TAKE 1 TABLET BY MOUTH EVERY OTHER DAY 45 tablet 3   Multiple Vitamin (MULTIVITAMIN) capsule Take 1 capsule by mouth 3 (three) times a week.      No current facility-administered medications on file prior to visit.    There were no vitals taken for this visit.      Objective:   Physical Exam Vitals and nursing note reviewed.  Constitutional:      General: He is not in acute distress.    Appearance: Normal appearance. He is well-developed and normal weight.  HENT:     Head: Normocephalic and atraumatic.     Right Ear: Tympanic membrane, ear canal and external ear normal. There is no impacted cerumen.     Left Ear: Tympanic membrane, ear canal and external ear normal. There is no impacted cerumen.     Nose: Nose normal. No congestion or rhinorrhea.     Mouth/Throat:     Mouth: Mucous membranes are moist.     Pharynx: Oropharynx is clear. No oropharyngeal exudate or posterior oropharyngeal erythema.  Eyes:     General:        Right eye: No discharge.        Left eye: No discharge.     Extraocular  Movements: Extraocular movements intact.     Conjunctiva/sclera: Conjunctivae normal.     Pupils: Pupils are equal, round, and reactive to light.  Neck:     Vascular: No carotid bruit.     Trachea: No tracheal deviation.  Cardiovascular:     Rate and Rhythm: Normal rate and regular rhythm.     Pulses: Normal pulses.     Heart sounds: Normal heart sounds. No murmur heard.   No friction rub. No gallop.  Pulmonary:     Effort: Pulmonary effort is normal. No respiratory distress.     Breath sounds: Normal breath sounds. No stridor. No wheezing, rhonchi or rales.  Chest:     Chest wall: No tenderness.  Abdominal:  General: Bowel sounds are normal. There is no distension.     Palpations: Abdomen is soft. There is no mass.     Tenderness: There is no abdominal tenderness. There is no right CVA tenderness, left CVA tenderness, guarding or rebound.     Hernia: No hernia is present.  Musculoskeletal:        General: No swelling, tenderness, deformity or signs of injury. Normal range of motion.     Right lower leg: No edema.     Left lower leg: No edema.  Lymphadenopathy:     Cervical: No cervical adenopathy.  Skin:    General: Skin is warm and dry.     Capillary Refill: Capillary refill takes less than 2 seconds.     Coloration: Skin is not jaundiced or pale.     Findings: No bruising, erythema, lesion or rash.  Neurological:     General: No focal deficit present.     Mental Status: He is alert and oriented to person, place, and time.     Cranial Nerves: No cranial nerve deficit.     Sensory: No sensory deficit.     Motor: No weakness.     Coordination: Coordination normal.     Gait: Gait normal.     Deep Tendon Reflexes: Reflexes normal.  Psychiatric:        Mood and Affect: Mood normal.        Behavior: Behavior normal.        Thought Content: Thought content normal.        Judgment: Judgment normal.      Assessment & Plan:

## 2021-05-24 ENCOUNTER — Ambulatory Visit (AMBULATORY_SURGERY_CENTER): Payer: Managed Care, Other (non HMO) | Admitting: *Deleted

## 2021-05-24 ENCOUNTER — Other Ambulatory Visit: Payer: Self-pay

## 2021-05-24 VITALS — Ht 71.0 in | Wt 180.0 lb

## 2021-05-24 DIAGNOSIS — Z86010 Personal history of colonic polyps: Secondary | ICD-10-CM

## 2021-05-24 MED ORDER — NA SULFATE-K SULFATE-MG SULF 17.5-3.13-1.6 GM/177ML PO SOLN: 1.0000 | Freq: Once | ORAL | 0 refills | Status: AC

## 2021-05-24 NOTE — Progress Notes (Signed)
No egg or soy allergy known to patient  ?No issues known to pt with past sedation with any surgeries or procedures ?Patient denies ever being told they had issues or difficulty with intubation  ?No FH of Malignant Hyperthermia ?Pt is not on diet pills ?Pt is not on  home 02  ?Pt is not on blood thinners  ?Pt denies issues with constipation  ?No A fib or A flutter ? ?Pt is fully vaccinated  for Covid  ? ? NO PA's for preps discussed with pt In PV today  ?Discussed with pt there will be an out-of-pocket cost for prep and that varies from $0 to 70 +  dollars - pt verbalized understanding  ? ?Due to the COVID-19 pandemic we are asking patients to follow certain guidelines in PV and the [place]   ?Pt aware of COVID protocols and [facility] guidelines  ? ?PV completed over the phone. Pt verified name, DOB, address and insurance during PV today.  ?Pt mailed instruction packet with copy of consent form to read and not return, and instructions.  ?Pt encouraged to call with questions or issues.  ?If pt has My chart, procedure instructions sent via My Chart  ? ?

## 2021-05-26 ENCOUNTER — Encounter: Payer: Managed Care, Other (non HMO) | Admitting: Adult Health

## 2021-05-26 NOTE — Progress Notes (Deleted)
? ?Subjective:  ? ? Patient ID: Alan Mcguire, male    DOB: February 26, 1956, 66 y.o.   MRN: [id number] ? ?HPI ?Patient presents for yearly preventative medicine examination. 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 ? ?H/o Mitral Valve Prolapse - s/p repair in November 2018 -denies shortness of breath, DOE, chest pain, or exercise intolerance.  Seen by cardiology yearly.  Takes aspirin 81 mg daily ? ?Hyperlipidemia-takes Lipitor 20 mg every other day.  Denies myalgia or fatigue with this regimen. ?Lab Results  ?Component Value Date  ? CHOL 185 05/08/2020  ? HDL 57.50 05/08/2020  ? LDLCALC 111 (H) 05/08/2020  ? LDLDIRECT 123.4 06/20/2011  ? TRIG 84.0 05/08/2020  ? CHOLHDL 3 05/08/2020  ? ? ?All immunizations and health maintenance protocols were reviewed with the patient and needed orders were placed. ? ?Appropriate screening laboratory values were ordered for the patient including screening of hyperlipidemia, renal function and hepatic function. ?If indicated by BPH, a PSA was ordered. ? ?Medication reconciliation,  past medical history, social history, problem list and allergies were reviewed in detail with the patient ? ?Goals were established with regard to weight loss, exercise, and  diet in compliance with medications ? ?He is scheduled for screening colonoscopy at the end of this month  ? ?Review of Systems  ?Constitutional: Negative.   ?HENT: Negative.    ?Eyes: Negative.   ?Respiratory: Negative.    ?Cardiovascular: Negative.   ?Gastrointestinal: Negative.   ?Endocrine: Negative.   ?Genitourinary: Negative.   ?Musculoskeletal: Negative.   ?Skin: Negative.   ?Allergic/Immunologic: Negative.   ?Neurological: Negative.   ?Hematological: Negative.    ?Psychiatric/Behavioral: Negative.    ?All other systems reviewed and are negative. ? ?Past Medical History:  ?Diagnosis Date  ? Chronic lower back pain   ? Hyperlipidemia   ? borderline  ? Lumbar disc disease   ? L4-5  ? Murmur 9/92011  ? echo-MVP posterior leaflet with moderate mitral insufficiency and the jet directed anteriorly,left atrium mildly enlarged  ? MVP (mitral valve prolapse)   ? Non-rheumatic mitral regurgitation   ? Pneumonia 1990s  ? hx walking pneumonia  ? 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  ? Complex valvuloplasty including triangular resection of flail segment of posterior leaflet, artificial Gore-tex neochord placement x4 and 34 mm Sorin Memo 3D ring annuloplasty via right mini thoracotomy approach  ? ? ?Social History  ? ?Socioeconomic History  ? Marital status: Married  ?  Spouse name: Not on file  ? Number of children: Not on file  ? Years of education: Not on file  ? Highest education level: Not on file  ?Occupational History  ? Not on file  ?Tobacco Use  ? Smoking status: Never  ? Smokeless tobacco: Never  ?Vaping Use  ? Vaping Use: Never used  ?Substance and Sexual Activity  ? Alcohol use: No  ? Drug use: No  ? Sexual activity: Not on file  ?Other Topics Concern  ? Not on file  ?Social History Narrative  ? [employer] company   ? Married   ? 4 kids ( 2 in college and 2 married)   ? He likes to play golf and work in the yard.   ? He coaches swimming  ? ?Social Determinants of Health  ? ?  Financial Resource Strain: Not on file  ?Food Insecurity: Not on file  ?Transportation Needs: Not on file  ?Physical Activity: Not on file  ?Stress: Not on file  ?Social Connections: Not on file  ?Intimate Partner Violence: Not on file  ? ? ?Past Surgical History:  ?Procedure Laterality Date  ? CARDIAC CATHETERIZATION    ? COLONOSCOPY    ? MITRAL VALVE REPAIR Right 01/24/2017  ? Procedure: MINIMALLY INVASIVE MITRAL VALVE REPAIR (MVR);  Surgeon: Rexene Alberts, MD;   Location: [place];  Service: Open Heart Surgery;  Laterality: Right;  ? POLYPECTOMY    ? RIGHT/LEFT HEART CATH AND CORONARY ANGIOGRAPHY N/A 10/28/2016  ? Procedure: Right/Left Heart Cath and Coronary Angiography;  Surgeon: [name], [name], MD;  Location: [facility];  Service: Cardiovascular;  Laterality: N/A;  ? TEE WITHOUT CARDIOVERSION N/A 09/21/2016  ? Procedure: TRANSESOPHAGEAL ECHOCARDIOGRAM (TEE);  Surgeon: Skeet Latch, MD;  Location: [place];  Service: Cardiovascular;  Laterality: N/A;  ? TEE WITHOUT CARDIOVERSION N/A 01/24/2017  ? Procedure: TRANSESOPHAGEAL ECHOCARDIOGRAM (TEE);  Surgeon: Rexene Alberts, MD;  Location: [place];  Service: Open Heart Surgery;  Laterality: N/A;  ? VASECTOMY    ? WISDOM TOOTH EXTRACTION    ? ? ?Family History  ?Problem Relation Age of Onset  ? Hypertension Mother   ? Lung cancer Mother   ? Kidney failure Father   ?     died  ? Kidney disease Father   ? Colon cancer Neg Hx   ? Colon polyps Neg Hx   ? Esophageal cancer Neg Hx   ? Rectal cancer Neg Hx   ? Stomach cancer Neg Hx   ? ? ?Allergies  ?Allergen Reactions  ? Bee Venom Anaphylaxis  ? ? ?Current Outpatient Medications on File Prior to Visit  ?Medication Sig Dispense Refill  ? aspirin 81 MG tablet Take 81 mg by mouth daily.    ? atorvastatin (LIPITOR) 20 MG tablet TAKE 1 TABLET BY MOUTH EVERY OTHER DAY (Patient taking differently: Three x a week) 45 tablet 3  ? Multiple Vitamin (MULTIVITAMIN) capsule Take 1 capsule by mouth 3 (three) times a week.     ? TURMERIC PO Take by mouth. 3 x a week    ? ?No current facility-administered medications on file prior to visit.  ? ? ?There were no vitals taken for this visit. ? ? ?   ?Objective:  ? Physical Exam ?Vitals and nursing note reviewed.  ?Constitutional:   ?   General: He is not in acute distress. ?   Appearance: Normal appearance. He is well-developed and normal weight.  ?HENT:  ?   Head: Normocephalic and atraumatic.  ?   Right Ear: Tympanic membrane, ear canal  and external ear normal. There is no impacted cerumen.  ?   Left Ear: Tympanic membrane, ear canal and external ear normal. There is no impacted cerumen.  ?   Nose: Nose normal. No congestion or rhinorrhea.  ?   Mouth/Throat:  ?   Mouth: Mucous membranes are moist.  ?   Pharynx: Oropharynx is clear. No oropharyngeal exudate or posterior oropharyngeal erythema.  ?Eyes:  ?   General:     ?   Right eye: No discharge.     ?   Left eye: No discharge.  ?   Extraocular Movements: Extraocular movements intact.  ?   Conjunctiva/sclera: Conjunctivae normal.  ?   Pupils: Pupils are equal, round, and reactive to light.  ?Neck:  ?  Vascular: No carotid bruit.  ?   Trachea: No tracheal deviation.  ?Cardiovascular:  ?   Rate and Rhythm: Normal rate and regular rhythm.  ?   Pulses: Normal pulses.  ?   Heart sounds: Normal heart sounds. No murmur heard. ?  No friction rub. No gallop.  ?Pulmonary:  ?   Effort: Pulmonary effort is normal. No respiratory distress.  ?   Breath sounds: Normal breath sounds. No stridor. No wheezing, rhonchi or rales.  ?Chest:  ?   Chest wall: No tenderness.  ?Abdominal:  ?   General: Bowel sounds are normal. There is no distension.  ?   Palpations: Abdomen is soft. There is no mass.  ?   Tenderness: There is no abdominal tenderness. There is no right CVA tenderness, left CVA tenderness, guarding or rebound.  ?   Hernia: No hernia is present.  ?Musculoskeletal:     ?   General: No swelling, tenderness, deformity or signs of injury. Normal range of motion.  ?   Right lower leg: No edema.  ?   Left lower leg: No edema.  ?Lymphadenopathy:  ?   Cervical: No cervical adenopathy.  ?Skin: ?   General: Skin is warm and dry.  ?   Capillary Refill: Capillary refill takes less than 2 seconds.  ?   Coloration: Skin is not jaundiced or pale.  ?   Findings: No bruising, erythema, lesion or rash.  ?Neurological:  ?   General: No focal deficit present.  ?   Mental Status: He is alert and oriented to person, place, and  time.  ?   Cranial Nerves: No cranial nerve deficit.  ?   Sensory: No sensory deficit.  ?   Motor: No weakness.  ?   Coordination: Coordination normal.  ?   Gait: Gait normal.  ?   Deep Tendon Reflexes: Reflexes normal.  ?Psyc

## 2021-05-27 ENCOUNTER — Encounter: Payer: Self-pay | Admitting: Gastroenterology

## 2021-06-07 ENCOUNTER — Encounter: Payer: Self-pay | Admitting: Gastroenterology

## 2021-06-07 ENCOUNTER — Ambulatory Visit (AMBULATORY_SURGERY_CENTER): Payer: Managed Care, Other (non HMO) | Admitting: Gastroenterology

## 2021-06-07 VITALS — BP 100/61 | HR 74 | Temp 96.6°F | Resp 17 | Ht 71.0 in | Wt 180.0 lb

## 2021-06-07 DIAGNOSIS — D123 Benign neoplasm of transverse colon: Secondary | ICD-10-CM

## 2021-06-07 DIAGNOSIS — Z86010 Personal history of colonic polyps: Secondary | ICD-10-CM | POA: Diagnosis not present

## 2021-06-07 DIAGNOSIS — Z860101 Personal history of adenomatous and serrated colon polyps: Secondary | ICD-10-CM

## 2021-06-07 DIAGNOSIS — D124 Benign neoplasm of descending colon: Secondary | ICD-10-CM

## 2021-06-07 DIAGNOSIS — K635 Polyp of colon: Secondary | ICD-10-CM

## 2021-06-07 MED ORDER — SODIUM CHLORIDE 0.9 % IV SOLN: 500.0000 mL | Freq: Once | INTRAVENOUS | Status: DC

## 2021-06-07 NOTE — Progress Notes (Signed)
Called to room to assist during endoscopic procedure.  Patient ID and intended procedure confirmed with present staff. Received instructions for my participation in the procedure from the performing physician.  

## 2021-06-07 NOTE — Progress Notes (Signed)
? ?History & Physical ? ?Primary Care Physician:  Dorothyann Peng, NP ?Primary Gastroenterologist: Lucio Edward, MD ? ?CHIEF COMPLAINT:  Personal history of colon polyps  ? ?HPI: Alan Mcguire is a 66 y.o. male personal history of adenomatous colon polyps for colonoscopy.  ? ? ?Past Medical History:  ?Diagnosis Date  ? Chronic lower back pain   ? Hyperlipidemia   ? borderline  ? Lumbar disc disease   ? L4-5  ? Murmur 9/92011  ? echo-MVP posterior leaflet with moderate mitral insufficiency and the jet directed anteriorly,left atrium mildly enlarged  ? MVP (mitral valve prolapse)   ? Non-rheumatic mitral regurgitation   ? Pneumonia 1990s  ? hx walking pneumonia  ? 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  ? Complex valvuloplasty including triangular resection of flail segment of posterior leaflet, artificial Gore-tex neochord placement x4 and 34 mm Sorin Memo 3D ring annuloplasty via right mini thoracotomy approach  ? ? ?Past Surgical History:  ?Procedure Laterality Date  ? CARDIAC CATHETERIZATION    ? COLONOSCOPY    ? MITRAL VALVE REPAIR Right 01/24/2017  ? Procedure: MINIMALLY INVASIVE MITRAL VALVE REPAIR (MVR);  Surgeon: Rexene Alberts, MD;  Location: [place];  Service: Open Heart Surgery;  Laterality: Right;  ? POLYPECTOMY    ? RIGHT/LEFT HEART CATH AND CORONARY ANGIOGRAPHY N/A 10/28/2016  ? Procedure: Right/Left Heart Cath and Coronary Angiography;  Surgeon: [name], [name], MD;  Location: [facility];  Service: Cardiovascular;  Laterality: N/A;  ? TEE WITHOUT CARDIOVERSION N/A 09/21/2016  ? Procedure: TRANSESOPHAGEAL ECHOCARDIOGRAM (TEE);  Surgeon: Skeet Latch, MD;  Location: [place];  Service: Cardiovascular;  Laterality: N/A;  ? TEE WITHOUT CARDIOVERSION N/A 01/24/2017  ? Procedure: TRANSESOPHAGEAL ECHOCARDIOGRAM (TEE);  Surgeon: Rexene Alberts, MD;  Location: [place];  Service: Open Heart Surgery;  Laterality: N/A;  ? VASECTOMY    ? WISDOM TOOTH EXTRACTION    ? ? ?Prior to  Admission medications   ?Medication Sig Start Date End Date Taking? Authorizing Provider  ?aspirin 81 MG tablet Take 81 mg by mouth daily.   Yes [provider]  ?atorvastatin (LIPITOR) 20 MG tablet TAKE 1 TABLET BY MOUTH EVERY OTHER DAY ?Patient taking differently: Three x a week 08/07/20  Yes Nafziger, Tommi Rumps, NP  ?Multiple Vitamin (MULTIVITAMIN) capsule Take 1 capsule by mouth 3 (three) times a week.    Yes [provider]  ?TURMERIC PO Take by mouth. 3 x a week   Yes [provider]  ? ? ?Current Outpatient Medications  ?Medication Sig Dispense Refill  ? aspirin 81 MG tablet Take 81 mg by mouth daily.    ? atorvastatin (LIPITOR) 20 MG tablet TAKE 1 TABLET BY MOUTH EVERY OTHER DAY (Patient taking differently: Three x a week) 45 tablet 3  ? Multiple Vitamin (MULTIVITAMIN) capsule Take 1 capsule by mouth 3 (three) times a week.     ? TURMERIC PO Take by mouth. 3 x a week    ? ?Current Facility-Administered Medications  ?Medication Dose Route Frequency Provider Last Rate Last Admin  ? 0.9 %  sodium chloride infusion  500 mL Intravenous Once Ladene Artist, MD      ? ? ?Allergies as of 06/07/2021 - Review Complete 06/07/2021  ?Allergen Reaction Noted  ? Bee venom Anaphylaxis 01/23/2017  ? ? ?Family History  ?Problem Relation Age of Onset  ? Hypertension Mother   ? Lung cancer Mother   ? Kidney failure Father   ?  died  ? Kidney disease Father   ? Colon cancer Neg Hx   ? Colon polyps Neg Hx   ? Esophageal cancer Neg Hx   ? Rectal cancer Neg Hx   ? Stomach cancer Neg Hx   ? ? ?Social History  ? ?Socioeconomic History  ? Marital status: Married  ?  Spouse name: Not on file  ? Number of children: Not on file  ? Years of education: Not on file  ? Highest education level: Not on file  ?Occupational History  ? Not on file  ?Tobacco Use  ? Smoking status: Never  ? Smokeless tobacco: Never  ?Vaping Use  ? Vaping Use: Never used  ?Substance and Sexual Activity  ? Alcohol use: No  ? Drug use: No  ?  Sexual activity: Not on file  ?Other Topics Concern  ? Not on file  ?Social History Narrative  ? [employer] company   ? Married   ? 4 kids ( 2 in college and 2 married)   ? He likes to play golf and work in the yard.   ? He coaches swimming  ? ?Social Determinants of Health  ? ?Financial Resource Strain: Not on file  ?Food Insecurity: Not on file  ?Transportation Needs: Not on file  ?Physical Activity: Not on file  ?Stress: Not on file  ?Social Connections: Not on file  ?Intimate Partner Violence: Not on file  ? ? ?Review of Systems: ? ?All systems reviewed an negative except where noted in HPI. ? ?Gen: Denies any fever, chills, sweats, anorexia, fatigue, weakness, malaise, weight loss, and sleep disorder ?CV: Denies chest pain, angina, palpitations, syncope, orthopnea, PND, peripheral edema, and claudication. ?Resp: Denies dyspnea at rest, dyspnea with exercise, cough, sputum, wheezing, coughing up blood, and pleurisy. ?GI: Denies vomiting blood, jaundice, and fecal incontinence.   Denies dysphagia or odynophagia. ?GU : Denies urinary burning, blood in urine, urinary frequency, urinary hesitancy, nocturnal urination, and urinary incontinence. ?MS: Denies joint pain, limitation of movement, and swelling, stiffness, low back pain, extremity pain. Denies muscle weakness, cramps, atrophy.  ?Derm: Denies rash, itching, dry skin, hives, moles, warts, or unhealing ulcers.  ?Psych: Denies depression, anxiety, memory loss, suicidal ideation, hallucinations, paranoia, and confusion. ?Heme: Denies bruising, bleeding, and enlarged lymph nodes. ?Neuro:  Denies any headaches, dizziness, paresthesias. ?Endo:  Denies any problems with DM, thyroid, adrenal function. ? ? ?Physical Exam: ?General:  Alert, well-developed, in NAD ?Head:  Normocephalic and atraumatic. ?Eyes:  Sclera clear, no icterus.   Conjunctiva pink. ?Ears:  Normal auditory acuity. ?Mouth:  No deformity or lesions.  ?Neck:  Supple; no masses . ?Lungs:   Clear throughout to auscultation.   No wheezes, crackles, or rhonchi. No acute distress. ?Heart:  Regular rate and rhythm; no murmurs. ?Abdomen:  Soft, nondistended, nontender. No masses, hepatomegaly. No obvious masses.  Normal bowel .    ?Rectal:  Deferred   ?Msk:  Symmetrical without gross deformities.Marland Kitchen ?Pulses:  Normal pulses noted. ?Extremities:  Without edema. ?Neurologic:  Alert and  oriented x4;  grossly normal neurologically. ?Skin:  Intact without significant lesions or rashes. ?Cervical Nodes:  No significant cervical adenopathy. ?Psych:  Alert and cooperative. Normal mood and affect. ? ? ?Impression / Plan:  ? ?Personal history of adenomatous colon polyps for colonoscopy.  ? ?Yarely Bebee T. Fuller Plan  06/07/2021, 10:01 AM ?See Shea Evans, [facility], to contact our on call provider ? ? ?  ?

## 2021-06-07 NOTE — Op Note (Signed)
[facility] ?Patient Name: Alan Mcguire ?Procedure Date: 06/07/2021 9:55 AM ?MRN: [id number] ?Endoscopist: Ladene Artist , MD ?Age: 66 ?Referring MD:  ?Date of Birth: Dec 21, 1955 ?Gender: Male ?Account #: [id number] ?Procedure:                Colonoscopy ?Indications:              Surveillance: Personal history of adenomatous  ?                          polyps on last colonoscopy > 5 years ago ?Medicines:                Monitored Anesthesia Care ?Procedure:                Pre-Anesthesia Assessment: ?                          - Prior to the procedure, a History and Physical  ?                          was performed, and patient medications and  ?                          allergies were reviewed. The patient's tolerance of  ?                          previous anesthesia was also reviewed. The risks  ?                          and benefits of the procedure and the sedation  ?                          options and risks were discussed with the patient.  ?                          All questions were answered, and informed consent  ?                          was obtained. Prior Anticoagulants: The patient has  ?                          taken no previous anticoagulant or antiplatelet  ?                          agents. ASA Grade Assessment: II - A patient with  ?                          mild systemic disease. After reviewing the risks  ?                          and benefits, the patient was deemed in  ?                          satisfactory condition to undergo the procedure. ?  After obtaining informed consent, the colonoscope  ?                          was passed under direct vision. Throughout the  ?                          procedure, the patient's blood pressure, pulse, and  ?                          oxygen saturations were monitored continuously. The  ?                          Olympus PCF-H190DL (#[id number]) Colonoscope was  ?                          introduced through the  anus and advanced to the the  ?                          cecum, identified by appendiceal orifice and  ?                          ileocecal valve. The ileocecal valve, appendiceal  ?                          orifice, and rectum were photographed. The quality  ?                          of the bowel preparation was excellent. The  ?                          colonoscopy was performed without difficulty. The  ?                          patient tolerated the procedure well. ?Scope In: 10:06:45 AM ?Scope Out: 10:20:47 AM ?Scope Withdrawal Time: 0 hours 11 minutes 53 seconds  ?Total Procedure Duration: 0 hours 14 minutes 2 seconds  ?Findings:                 The perianal and digital rectal examinations were  ?                          normal. ?                          Two sessile polyps were found in the descending  ?                          colon and transverse colon. The polyps were 5 to 7  ?                          mm in size. These polyps were removed with a cold  ?                          snare. Resection and retrieval were complete. ?  A few small-mouthed diverticula were found in the  ?                          left colon. There was no evidence of diverticular  ?                          bleeding. ?                          Internal hemorrhoids were found during  ?                          retroflexion. The hemorrhoids were small and Grade  ?                          I (internal hemorrhoids that do not prolapse). ?                          The exam was otherwise without abnormality on  ?                          direct and retroflexion views. ?Complications:            No immediate complications. Estimated blood loss:  ?                          None. ?Estimated Blood Loss:     Estimated blood loss: none. ?Impression:               - Two 5 to 7 mm polyps in the descending colon and  ?                          in the transverse colon, removed with a cold snare.  ?                           Resected and retrieved. ?                          - Mild diverticulosis in the left colon. ?                          - Internal hemorrhoids. ?                          - The examination was otherwise normal on direct  ?                          and retroflexion views. ?Recommendation:           - Repeat colonoscopy after studies are complete for  ?                          surveillance based on pathology results. ?                          - Patient has a contact number available for  ?  emergencies. The signs and symptoms of potential  ?                          delayed complications were discussed with the  ?                          patient. Return to normal activities tomorrow.  ?                          Written discharge instructions were provided to the  ?                          patient. ?                          - High fiber diet. ?                          - Continue present medications. ?                          - Await pathology results. ?Ladene Artist, MD ?06/07/2021 10:25:29 AM ?This report has been signed electronically. ?

## 2021-06-07 NOTE — Progress Notes (Signed)
Pt's states no medical or surgical changes since previsit or office visit. 

## 2021-06-07 NOTE — Patient Instructions (Signed)
YOU HAD AN ENDOSCOPIC PROCEDURE TODAY AT [facility]:   Refer to the procedure report that was given to you for any specific questions about what was found during the examination.  If the procedure report does not answer your questions, please call your gastroenterologist to clarify.  If you requested that your care partner not be given the details of your procedure findings, then the procedure report has been included in a sealed envelope for you to review at your convenience later. ? ?**Handout given on polyps, hemorrhoids, diverticulosis and high fiber diet** ? ?YOU SHOULD EXPECT: Some feelings of bloating in the abdomen. Passage of more gas than usual.  Walking can help get rid of the air that was put into your GI tract during the procedure and reduce the bloating. If you had a lower endoscopy (such as a colonoscopy or flexible sigmoidoscopy) you may notice spotting of blood in your stool or on the toilet paper. If you underwent a bowel prep for your procedure, you may not have a normal bowel movement for a few days. ? ?Please Note:  You might notice some irritation and congestion in your nose or some drainage.  This is from the oxygen used during your procedure.  There is no need for concern and it should clear up in a day or so. ? ?SYMPTOMS TO REPORT IMMEDIATELY: ? ?Following lower endoscopy (colonoscopy or flexible sigmoidoscopy): ? Excessive amounts of blood in the stool ? Significant tenderness or worsening of abdominal pains ? Swelling of the abdomen that is new, acute ? Fever of 100?F or higher ? ? ?For urgent or emergent issues, a gastroenterologist can be reached at any hour by calling [phone]. ?Do not use MyChart messaging for urgent concerns.  ? ? ?DIET:  We do recommend a small meal at first, but then you may proceed to your regular diet.  Drink plenty of fluids but you should avoid alcoholic beverages for 24 hours. ? ?ACTIVITY:  You should plan to take it easy for the rest of  today and you should NOT DRIVE or use heavy machinery until tomorrow (because of the sedation medicines used during the test).   ? ?FOLLOW UP: ?Our staff will call the number listed on your records 48-72 hours following your procedure to check on you and address any questions or concerns that you may have regarding the information given to you following your procedure. If we do not reach you, we will leave a message.  We will attempt to reach you two times.  During this call, we will ask if you have developed any symptoms of COVID 19. If you develop any symptoms (ie: fever, flu-like symptoms, shortness of breath, cough etc.) before then, please call [phone].  If you test positive for Covid 19 in the 2 weeks post procedure, please call and report this information to [place].   ? ?If any biopsies were taken you will be contacted by phone or by letter within the next 1-3 weeks.  Please call us at [phone] if you have not heard about the biopsies in 3 weeks.  ? ? ?SIGNATURES/CONFIDENTIALITY: ?You and/or your care partner have signed paperwork which will be entered into your electronic medical record.  These signatures attest to the fact that that the information above on your After Visit Summary has been reviewed and is understood.  Full responsibility of the confidentiality of this discharge information lies with you and/or your care-partner.  ?

## 2021-06-07 NOTE — Progress Notes (Signed)
D.T. vital signs. °

## 2021-06-07 NOTE — Progress Notes (Signed)
Report to PACU, RN, vss, BBS= Clear.  

## 2021-06-09 ENCOUNTER — Telehealth: Payer: Self-pay

## 2021-06-09 NOTE — Telephone Encounter (Signed)
Called [phone] and left a message we tried to reach pt for a follow up call. maw  ?

## 2021-06-28 ENCOUNTER — Encounter: Payer: Self-pay | Admitting: Gastroenterology

## 2021-07-08 ENCOUNTER — Ambulatory Visit (INDEPENDENT_AMBULATORY_CARE_PROVIDER_SITE_OTHER): Payer: Managed Care, Other (non HMO) | Admitting: Adult Health

## 2021-07-08 ENCOUNTER — Encounter: Payer: Self-pay | Admitting: Adult Health

## 2021-07-08 VITALS — BP 120/88 | HR 80 | Temp 98.0°F | Ht 71.0 in | Wt 182.0 lb

## 2021-07-08 DIAGNOSIS — Z114 Encounter for screening for human immunodeficiency virus [HIV]: Secondary | ICD-10-CM

## 2021-07-08 DIAGNOSIS — Z23 Encounter for immunization: Secondary | ICD-10-CM | POA: Diagnosis not present

## 2021-07-08 DIAGNOSIS — Z125 Encounter for screening for malignant neoplasm of prostate: Secondary | ICD-10-CM | POA: Diagnosis not present

## 2021-07-08 DIAGNOSIS — R350 Frequency of micturition: Secondary | ICD-10-CM

## 2021-07-08 DIAGNOSIS — Z0000 Encounter for general adult medical examination without abnormal findings: Secondary | ICD-10-CM | POA: Diagnosis not present

## 2021-07-08 DIAGNOSIS — I341 Nonrheumatic mitral (valve) prolapse: Secondary | ICD-10-CM

## 2021-07-08 DIAGNOSIS — E782 Mixed hyperlipidemia: Secondary | ICD-10-CM

## 2021-07-08 LAB — CBC WITH DIFFERENTIAL/PLATELET
Basophils Absolute: 0.1 10*3/uL (ref 0.0–0.1)
Basophils Relative: 1.2 % (ref 0.0–3.0)
Eosinophils Absolute: 0.1 10*3/uL (ref 0.0–0.7)
Eosinophils Relative: 2.1 % (ref 0.0–5.0)
HCT: 48.8 % (ref 39.0–52.0)
Hemoglobin: 16.2 g/dL (ref 13.0–17.0)
Lymphocytes Relative: 15.7 % (ref 12.0–46.0)
Lymphs Abs: 0.8 10*3/uL (ref 0.7–4.0)
MCHC: 33.3 g/dL (ref 30.0–36.0)
MCV: 86.6 fl (ref 78.0–100.0)
Monocytes Absolute: 0.5 10*3/uL (ref 0.1–1.0)
Monocytes Relative: 10.1 % (ref 3.0–12.0)
Neutro Abs: 3.8 10*3/uL (ref 1.4–7.7)
Neutrophils Relative %: 70.9 % (ref 43.0–77.0)
Platelets: 221 10*3/uL (ref 150.0–400.0)
RBC: 5.63 Mil/uL (ref 4.22–5.81)
RDW: 13.3 % (ref 11.5–15.5)
WBC: 5.3 10*3/uL (ref 4.0–10.5)

## 2021-07-08 LAB — URINALYSIS
Bilirubin Urine: NEGATIVE
Hgb urine dipstick: NEGATIVE
Ketones, ur: NEGATIVE
Leukocytes,Ua: NEGATIVE
Nitrite: NEGATIVE
Specific Gravity, Urine: 1.02 (ref 1.000–1.030)
Total Protein, Urine: NEGATIVE
Urine Glucose: NEGATIVE
Urobilinogen, UA: 0.2 (ref 0.0–1.0)
pH: 6.5 (ref 5.0–8.0)

## 2021-07-08 LAB — COMPREHENSIVE METABOLIC PANEL
ALT: 30 U/L (ref 0–53)
AST: 21 U/L (ref 0–37)
Albumin: 4.3 g/dL (ref 3.5–5.2)
Alkaline Phosphatase: 84 U/L (ref 39–117)
BUN: 17 mg/dL (ref 6–23)
CO2: 29 mEq/L (ref 19–32)
Calcium: 9.3 mg/dL (ref 8.4–10.5)
Chloride: 103 mEq/L (ref 96–112)
Creatinine, Ser: 1 mg/dL (ref 0.40–1.50)
GFR: 79.21 mL/min (ref >60.00)
Glucose, Bld: 94 mg/dL (ref 70–99)
Potassium: 4.3 mEq/L (ref 3.5–5.1)
Sodium: 139 mEq/L (ref 135–145)
Total Bilirubin: 0.8 mg/dL (ref 0.2–1.2)
Total Protein: 7 g/dL (ref 6.0–8.3)

## 2021-07-08 LAB — LIPID PANEL
Cholesterol: 198 mg/dL (ref 0–200)
HDL: 66 mg/dL (ref >39.00)
LDL Cholesterol: 114 mg/dL — ABNORMAL HIGH (ref 0–99)
NonHDL: 132.28
Total CHOL/HDL Ratio: 3
Triglycerides: 93 mg/dL (ref 0.0–149.0)
VLDL: 18.6 mg/dL (ref 0.0–40.0)

## 2021-07-08 LAB — TSH: TSH: 2.58 u[IU]/mL (ref 0.35–5.50)

## 2021-07-08 LAB — PSA: PSA: 1.59 ng/mL (ref 0.10–4.00)

## 2021-07-08 NOTE — Progress Notes (Signed)
? ?Subjective:  ? ? Patient ID: Alan Mcguire, male    DOB: [birth year], 66 y.o.   MRN: [id number] ? ?HPI ?Patient presents for yearly preventative medicine examination. 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 ? ?History of mitral valve prolapse-s/p repair in 2018.  Continues to do well after surgery.  Denies shortness of breath, DOE, chest pain, or exercise tolerance.  He is seen by cardiology yearly.  Takes 81 mg aspirin ? ?Hyperlipidemia-takes Lipitor 20 mg every other day.  With this regimen he does not have any myalgia or fatigue ?Lab Results  ?Component Value Date  ? CHOL 185 05/08/2020  ? HDL 57.50 05/08/2020  ? LDLCALC 111 (H) 05/08/2020  ? LDLDIRECT 123.4 06/20/2011  ? TRIG 84.0 05/08/2020  ? CHOLHDL 3 05/08/2020  ? ? ?Acute Issues  ? - over the last few weeks he has been experiencing more frequent urination throughout the day and night. He denies burning, discolation, blood in urine or low back pain. He has noticed that his stream has decreased and it is harder for him to urinate. No painful bowel movements  ? ? ?All immunizations and health maintenance protocols were reviewed with the patient and needed orders were placed. ? ?Appropriate screening laboratory values were ordered for the patient including screening of hyperlipidemia, renal function and hepatic function. ?If indicated by BPH, a PSA was ordered. ? ?Medication reconciliation,  past medical history, social history, problem list and allergies were reviewed in detail with the patient ? ?Goals were established with regard to weight loss, exercise, and  diet in compliance with medications. He does stay active and eat healthy.  ?Wt Readings from Last 3 Encounters:  ?06/07/21 180 lb (81.6 kg)   ?05/24/21 180 lb (81.6 kg)  ?03/01/21 181 lb 9.6 oz (82.4 kg)  ? ?He is up to date on routine colon cancer screening  ? ? ?Review of Systems  ?Constitutional: Negative.   ?HENT: Negative.    ?Eyes: Negative.   ?Respiratory: Negative.    ?Cardiovascular: Negative.   ?Gastrointestinal: Negative.   ?Endocrine: Negative.   ?Genitourinary:  Positive for difficulty urinating, frequency and urgency.  ?Musculoskeletal:  Positive for back pain (upper back - chornic).  ?Skin: Negative.   ?Allergic/Immunologic: Negative.   ?Neurological: Negative.   ?Hematological: Negative.   ?Psychiatric/Behavioral: Negative.    ?All other systems reviewed and are negative. ? ?Past Medical History:  ?Diagnosis Date  ? Chronic lower back pain   ? Hyperlipidemia   ? borderline  ? Lumbar disc disease   ? L4-5  ? Murmur 9/92011  ? echo-MVP posterior leaflet with moderate mitral insufficiency and the jet directed anteriorly,left atrium mildly enlarged  ? MVP (mitral valve prolapse)   ? Non-rheumatic mitral regurgitation   ? Pneumonia 1990s  ? hx walking pneumonia  ? 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  ? Complex valvuloplasty including triangular resection of flail segment of posterior leaflet, artificial Gore-tex neochord placement x4 and 34 mm Sorin Memo 3D ring annuloplasty via right mini thoracotomy approach  ? ? ?Social History  ? ?Socioeconomic History  ? Marital status: Married  ?  Spouse name: Not on file  ? Number of children: Not on file  ? Years of education: Not on file  ? Highest education level: Not on  file  ?Occupational History  ? Not on file  ?Tobacco Use  ? Smoking status: Never  ? Smokeless tobacco: Never  ?Vaping Use  ? Vaping Use: Never used  ?Substance and Sexual Activity  ? Alcohol use: No  ? Drug use: No  ? Sexual activity: Not on file  ?Other Topics Concern  ? Not on file  ?Social History Narrative  ? [employer]   ? Married   ? 4 kids ( 2 in college and 2 married)   ? He likes to  play golf and work in the yard.   ? He coaches swimming  ? ?Social Determinants of Health  ? ?Financial Resource Strain: Not on file  ?Food Insecurity: Not on file  ?Transportation Needs: Not on file  ?Physical Activity: Not on file  ?Stress: Not on file  ?Social Connections: Not on file  ?Intimate Partner Violence: Not on file  ? ? ?Past Surgical History:  ?Procedure Laterality Date  ? CARDIAC CATHETERIZATION    ? COLONOSCOPY    ? MITRAL VALVE REPAIR Right 01/24/2017  ? Procedure: MINIMALLY INVASIVE MITRAL VALVE REPAIR (MVR);  Surgeon: Rexene Alberts, MD;  Location: [place];  Service: Open Heart Surgery;  Laterality: Right;  ? POLYPECTOMY    ? RIGHT/LEFT HEART CATH AND CORONARY ANGIOGRAPHY N/A 10/28/2016  ? Procedure: Right/Left Heart Cath and Coronary Angiography;  Surgeon: [name], [name], MD;  Location: [place] [facility];  Service: Cardiovascular;  Laterality: N/A;  ? TEE WITHOUT CARDIOVERSION N/A 09/21/2016  ? Procedure: TRANSESOPHAGEAL ECHOCARDIOGRAM (TEE);  Surgeon: Skeet Latch, MD;  Location: [facility];  Service: Cardiovascular;  Laterality: N/A;  ? TEE WITHOUT CARDIOVERSION N/A 01/24/2017  ? Procedure: TRANSESOPHAGEAL ECHOCARDIOGRAM (TEE);  Surgeon: Rexene Alberts, MD;  Location: [place];  Service: Open Heart Surgery;  Laterality: N/A;  ? VASECTOMY    ? WISDOM TOOTH EXTRACTION    ? ? ?Family History  ?Problem Relation Age of Onset  ? Hypertension Mother   ? Lung cancer Mother   ? Kidney failure Father   ?     died  ? Kidney disease Father   ? Colon cancer Neg Hx   ? Colon polyps Neg Hx   ? Esophageal cancer Neg Hx   ? Rectal cancer Neg Hx   ? Stomach cancer Neg Hx   ? ? ?Allergies  ?Allergen Reactions  ? Bee Venom Anaphylaxis  ? ? ?Current Outpatient Medications on File Prior to Visit  ?Medication Sig Dispense Refill  ? aspirin 81 MG tablet Take 81 mg by mouth daily.    ? atorvastatin (LIPITOR) 20 MG tablet TAKE 1 TABLET BY MOUTH EVERY OTHER DAY (Patient taking differently: Three x a week) 45  tablet 3  ? Multiple Vitamin (MULTIVITAMIN) capsule Take 1 capsule by mouth 3 (three) times a week.     ? TURMERIC PO Take by mouth. 3 x a week    ? ?No current facility-administered medications on file prior to visit.  ? ? ?BP 120/88   Pulse 80   Temp 98 ?F (36.7 ?C) (Oral)   Ht '5\' 11"'$  (1.803 m)   Wt 182 lb (82.6 kg)   SpO2 99%   BMI 25.38 kg/m?  ? ? ?   ?Objective:  ? Physical Exam ?Vitals and nursing note reviewed.  ?Constitutional:   ?   General: He is not in acute distress. ?   Appearance: Normal appearance. He is well-developed and normal weight.  ?HENT:  ?  Head: Normocephalic and atraumatic.  ?   Right Ear: Tympanic membrane, ear canal and external ear normal. There is no impacted cerumen.  ?   Left Ear: Tympanic membrane, ear canal and external ear normal. There is no impacted cerumen.  ?   Nose: Nose normal. No congestion or rhinorrhea.  ?   Mouth/Throat:  ?   Mouth: Mucous membranes are moist.  ?   Pharynx: Oropharynx is clear. No oropharyngeal exudate or posterior oropharyngeal erythema.  ?Eyes:  ?   General:     ?   Right eye: No discharge.     ?   Left eye: No discharge.  ?   Extraocular Movements: Extraocular movements intact.  ?   Conjunctiva/sclera: Conjunctivae normal.  ?   Pupils: Pupils are equal, round, and reactive to light.  ?Neck:  ?   Vascular: No carotid bruit.  ?   Trachea: No tracheal deviation.  ?Cardiovascular:  ?   Rate and Rhythm: Normal rate and regular rhythm.  ?   Pulses: Normal pulses.  ?   Heart sounds: Normal heart sounds. No murmur heard. ?  No friction rub. No gallop.  ?Pulmonary:  ?   Effort: Pulmonary effort is normal. No respiratory distress.  ?   Breath sounds: Normal breath sounds. No stridor. No wheezing, rhonchi or rales.  ?Chest:  ?   Chest wall: No tenderness.  ?Abdominal:  ?   General: Bowel sounds are normal. There is no distension.  ?   Palpations: Abdomen is soft. There is no mass.  ?   Tenderness: There is no abdominal tenderness. There is no right CVA  tenderness, left CVA tenderness, guarding or rebound.  ?   Hernia: No hernia is present.  ?Musculoskeletal:     ?   General: No swelling, tenderness, deformity or signs of injury. Normal range of motion.  ?   Right lowe

## 2021-07-09 LAB — URINE CULTURE
MICRO NUMBER:: 13320863
Result:: NO GROWTH
SPECIMEN QUALITY:: ADEQUATE

## 2021-07-09 LAB — HIV ANTIBODY (ROUTINE TESTING W REFLEX): HIV 1&2 Ab, 4th Generation: NONREACTIVE

## 2021-07-13 ENCOUNTER — Other Ambulatory Visit: Payer: Self-pay

## 2021-07-13 MED ORDER — TAMSULOSIN HCL 0.4 MG PO CAPS: 0.4000 mg | ORAL_CAPSULE | Freq: Every day | ORAL | 0 refills | Status: DC

## 2021-08-17 ENCOUNTER — Other Ambulatory Visit: Payer: Self-pay | Admitting: Adult Health

## 2021-08-17 DIAGNOSIS — E782 Mixed hyperlipidemia: Secondary | ICD-10-CM

## 2021-09-21 ENCOUNTER — Other Ambulatory Visit: Payer: Self-pay | Admitting: Adult Health

## 2021-11-01 ENCOUNTER — Other Ambulatory Visit: Payer: Self-pay | Admitting: Adult Health

## 2021-12-21 ENCOUNTER — Other Ambulatory Visit: Payer: Self-pay | Admitting: Adult Health

## 2022-02-18 ENCOUNTER — Other Ambulatory Visit: Payer: Self-pay | Admitting: Adult Health

## 2022-03-24 ENCOUNTER — Other Ambulatory Visit: Payer: Self-pay | Admitting: Adult Health

## 2022-04-13 NOTE — Progress Notes (Signed)
[name] Date of Birth: February 20, 1956 Medical Record [id number]  History of Present Illness: Alan Mcguire is seen  for followup of mitral valve prolapse s/p MV repair. Last seen March 2019.  He has a history of mitral valve prolapse with moderate mitral insufficiency. He was seen in 2018 with  worsening murmur. Echo was done suggesting severe MR. TEE was then performed and showed MVP with severe MR. He subsequently underwent cardiac cath showing normal coronary anatomy and LV function. Normal right heart pressures. He underwent minimally invasive mitral valve repair on 01/24/2017 with complex valvuloplasty including triangular resection of the flail segment of the posterior leaflet. His surgical course progressed appropriately and he was diuresed following the procedure. He maintained NSR throughout admission. Hgb was stable at 13.3 at the time of discharge. On follow up he did complain of some fatigue and metoprolol dose was reduced. Follow up Echo showed a good repair. He has been switched from Coumadin to ASA.   On follow up today he states he is doing very well. He denies any dyspnea, palpitations, or chest pain. 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   Current Outpatient Medications on File Prior to Visit  Medication Sig Dispense Refill   amoxicillin (AMOXIL) 500 MG capsule Take 500 mg by mouth 3 (three) times daily.     aspirin 81 MG tablet Take 81 mg by mouth daily.     atorvastatin (LIPITOR) 20 MG tablet TAKE 1 TABLET BY MOUTH EVERY OTHER DAY 45 tablet 3   Multiple Vitamin (MULTIVITAMIN) capsule Take 1 capsule by mouth 3 (three) times a week.      tamsulosin (FLOMAX) 0.4 MG CAPS capsule TAKE 1 CAPSULE BY MOUTH EVERY DAY 30 capsule 0   No current facility-administered medications on file prior to visit.    Allergies  Allergen Reactions   Bee Venom Anaphylaxis    Past Medical History:  Diagnosis Date   Chronic lower back pain    Hyperlipidemia    borderline    Lumbar disc disease    L4-5   Murmur 9/92011   echo-MVP posterior leaflet with moderate mitral insufficiency and the jet directed anteriorly,left atrium mildly enlarged   MVP (mitral valve prolapse)    Non-rheumatic mitral regurgitation    Pneumonia 1990s   hx walking pneumonia   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   Complex valvuloplasty including triangular resection of flail segment of posterior leaflet, artificial Gore-tex neochord placement x4 and 34 mm Sorin Memo 3D ring annuloplasty via right mini thoracotomy approach    Past Surgical History:  Procedure Laterality Date   CARDIAC CATHETERIZATION     COLONOSCOPY     MITRAL VALVE REPAIR Right 01/24/2017   Procedure: MINIMALLY INVASIVE MITRAL VALVE REPAIR (MVR);  Surgeon: Rexene Alberts, MD;  Location: [place];  Service: Open Heart Surgery;  Laterality: Right;   POLYPECTOMY     RIGHT/LEFT HEART CATH AND CORONARY ANGIOGRAPHY N/A 10/28/2016   Procedure: Right/Left Heart Cath and Coronary Angiography;  Surgeon: [name], Salem Mastrogiovanni M, MD;  Location: [place] [facility];  Service: Cardiovascular;  Laterality: N/A;   TEE WITHOUT CARDIOVERSION N/A 09/21/2016   Procedure: TRANSESOPHAGEAL ECHOCARDIOGRAM (TEE);  Surgeon: Skeet Latch, MD;  Location: [place];  Service: Cardiovascular;  Laterality: N/A;   TEE WITHOUT CARDIOVERSION N/A 01/24/2017   Procedure: TRANSESOPHAGEAL ECHOCARDIOGRAM (TEE);  Surgeon: Rexene Alberts, MD;  Location: [place];  Service: Open Heart Surgery;  Laterality: N/A;   VASECTOMY     WISDOM  TOOTH EXTRACTION      Social History   Tobacco Use  Smoking Status Never  Smokeless Tobacco Never    Social History   Substance and Sexual Activity  Alcohol Use No    Family History  Problem Relation Age of Onset   Hypertension Mother    Lung cancer Mother    Kidney failure Father        died   Kidney disease Father    Colon cancer Neg Hx    Colon polyps Neg Hx    Esophageal cancer Neg Hx     Rectal cancer Neg Hx    Stomach cancer Neg Hx     Review of Systems: As noted in history of present illness .  All other systems were reviewed and are negative.  Physical Exam: BP 120/70   Pulse 80   Ht 5' 11"$  (1.803 m)   Wt 187 lb (84.8 kg)   SpO2 96%   BMI 26.08 kg/m  GENERAL:  Well appearing, WM in NAD HEENT:  PERRL, EOMI, sclera are clear. Oropharynx is clear. NECK:  No jugular venous distention, carotid upstroke brisk and symmetric, no bruits, no thyromegaly or adenopathy LUNGS:  Clear to auscultation bilaterally CHEST:  Surgical incisions have healed very well. HEART:  RRR,  PMI not displaced or sustained,S1 and S2 within normal limits, no S3, no S4: no clicks, no rubs, no murmurs ABD:  Soft, nontender. BS +, no masses or bruits. No hepatomegaly, no splenomegaly EXT:  2 + pulses throughout, no edema, no cyanosis no clubbing SKIN:  Warm and dry.  No rashes NEURO:  Alert and oriented x 3. Cranial nerves II through XII intact. PSYCH:  Cognitively intact    LABORATORY DATA:  Lab Results  Component Value Date   WBC 5.3 07/08/2021   HGB 16.2 07/08/2021   HCT 48.8 07/08/2021   PLT 221.0 07/08/2021   GLUCOSE 94 07/08/2021   CHOL 198 07/08/2021   TRIG 93.0 07/08/2021   HDL 66.00 07/08/2021   LDLDIRECT 123.4 06/20/2011   LDLCALC 114 (H) 07/08/2021   ALT 30 07/08/2021   AST 21 07/08/2021   NA 139 07/08/2021   K 4.3 07/08/2021   CL 103 07/08/2021   CREATININE 1.00 07/08/2021   BUN 17 07/08/2021   CO2 29 07/08/2021   TSH 2.58 07/08/2021   PSA 1.59 07/08/2021   INR 2.4 05/11/2017   HGBA1C 5.3 01/23/2017   Ecg today shows NSR with occ PVC. Rate 80. Otherwise normal. I have personally reviewed and interpreted this study.   Echo: 09/08/16: Study Conclusions   - Left ventricle: The cavity size was mildly dilated. Systolic   function was normal. The estimated ejection fraction was in the   range of 60% to 65%. Wall motion was normal; there were no   regional wall  motion abnormalities. Features are consistent with   a pseudonormal left ventricular filling pattern, with concomitant   abnormal relaxation and increased filling pressure (grade 2   diastolic dysfunction). Doppler parameters are consistent with   elevated ventricular end-diastolic filling pressure. - Aortic valve: There was mild regurgitation. - Mitral valve: There was moderate to severe regurgitation. - Left atrium: The atrium was moderately dilated. - Right ventricle: Systolic function was normal. - Tricuspid valve: There was moderate regurgitation. - Pulmonary arteries: Systolic pressure was mildly increased. PA   peak pressure: 43 mm Hg (S).   Impressions:   - Myxomatous, thickened mitral valve leaflets with bileaflet   prolapse posterior >>  anterior. There is anteriorly directed jet,   mitral regurgitation is at least moderate to severe, most   probably severe, there is systolic anterior flow reversal in the   pulmonary vein, moderate pulmonary hypertension.   A TEE is recommended for further evaluation.    TEE 09/21/16:Study Conclusions   - Left ventricle: Systolic function was normal. The estimated   ejection fraction was in the range of 60% to 65%. Wall motion was   normal; there were no regional wall motion abnormalities. - Aortic valve: There was mild regurgitation. - Mitral valve: Severe thickening of the anterior leaflet and   posterior leaflet, consistent with myxomatous proliferation.   Severe, late systolic prolapse, involving the posterior leaflet   and mild prolapse of the anterior leaflet. There was severe,   mild-late systolic regurgitation directed eccentrically and   toward the septum. Severe regurgitation is suggested by an   effective regurgitant orifice >= 0.40 cm^2, a regurgitant volume   >= 60 ml, pulmonary vein systolic flow reversal, and Coanda   effect. Late systolic flow reversal was noted in the right upper   pulmonary vein. Valve area by  continuity equation (using LVOT   flow): 1.78 cm^2. - Left atrium: No evidence of thrombus in the atrial cavity or   appendage. No evidence of thrombus in the atrial cavity or   appendage. - Right atrium: No evidence of thrombus in the atrial cavity or   appendage. - Atrial septum: No defect or patent foramen ovale was identified   by color flow Doppler.  Cardiac cath 10/28/16: Procedures   Right/Left Heart Cath and Coronary Angiography  Conclusion     The left ventricular systolic function is normal. The left ventricular ejection fraction is 55-65% by visual estimate. There is no aortic valve stenosis. There is severe (4+) mitral regurgitation and moderate mitral valve prolapse. Normal coronary anatomy   1. Normal coronary anatomy 2. Normal LV function 3. Normal right heart pressures. 4. Normal PCWP with mildly elevated LVEDP 5. Normal cardiac output.    Plan: consider for MV repair.      Echo 05/15/17: Study Conclusions   - Left ventricle: The cavity size was normal. Wall thickness was   increased in a pattern of mild LVH. Systolic function was normal.   The estimated ejection fraction was in the range of 50% to 55%.   Wall motion was normal; there were no regional wall motion   abnormalities. Doppler parameters are consistent with abnormal   left ventricular relaxation (grade 1 diastolic dysfunction).   Doppler parameters are consistent with high ventricular filling   pressure. - Aortic valve: There was trivial regurgitation. - Mitral valve: Prior procedures included surgical repair.   Impressions:   - Normal LV systolic function; mild diastolic dysfunction; mild   LVH; trace AI; s/p MV repair with mean gradient of 3 mmHg and no   MR.   Assessment / Plan: 1. Mitral valve prolapse with severe mitral insufficiency by Echo and TEE. Evidence of pulmonary HTN and LAE by Echo.  Now s/p MV repair in NOv 2018 with excellent result clinically and by Echo. Exam is normal  today.  Continue ASA. Follow up in one  year.

## 2022-04-22 ENCOUNTER — Ambulatory Visit: Payer: Managed Care, Other (non HMO) | Attending: Cardiology | Admitting: Cardiology

## 2022-04-22 ENCOUNTER — Encounter: Payer: Self-pay | Admitting: Cardiology

## 2022-04-22 VITALS — BP 120/70 | HR 80 | Ht 71.0 in | Wt 187.0 lb

## 2022-04-22 DIAGNOSIS — Z9889 Other specified postprocedural states: Secondary | ICD-10-CM

## 2022-04-22 DIAGNOSIS — I341 Nonrheumatic mitral (valve) prolapse: Secondary | ICD-10-CM | POA: Diagnosis not present

## 2022-04-22 NOTE — Patient Instructions (Signed)
Medication Instructions:  Continue current medications  *If you need a refill on your cardiac medications before your next appointment, please call your pharmacy*   Lab Work: None Ordered    Testing/Procedures: None Ordered   Follow-Up: At [facility], you and your health needs are our priority.  As part of our continuing mission to provide you with exceptional heart care, we have created designated Provider Care Teams.  These Care Teams include your primary Cardiologist (physician) and Advanced Practice Providers (APPs -  Physician Assistants and Nurse Practitioners) who all work together to provide you with the care you need, when you need it.  We recommend signing up for the patient portal called "MyChart".  Sign up information is provided on this After Visit Summary.  MyChart is used to connect with patients for Virtual Visits (Telemedicine).  Patients are able to view lab/test results, encounter notes, upcoming appointments, etc.  Non-urgent messages can be sent to your provider as well.   To learn more about what you can do with MyChart, go to [email or web address].    Your next appointment:   1 year(s)  Provider:   Peter [name], MD   Other Instructions

## 2022-05-30 ENCOUNTER — Other Ambulatory Visit: Payer: Self-pay | Admitting: Adult Health

## 2022-06-01 NOTE — Telephone Encounter (Signed)
Patient need to schedule a CPE ov for more refills.

## 2022-06-07 ENCOUNTER — Other Ambulatory Visit: Payer: Self-pay | Admitting: Adult Health

## 2022-06-12 ENCOUNTER — Encounter: Payer: Self-pay | Admitting: Adult Health

## 2022-06-13 ENCOUNTER — Other Ambulatory Visit: Payer: Self-pay | Admitting: Adult Health

## 2022-06-14 MED ORDER — TAMSULOSIN HCL 0.4 MG PO CAPS: 0.4000 mg | ORAL_CAPSULE | Freq: Every day | ORAL | 0 refills | Status: DC

## 2022-08-16 ENCOUNTER — Encounter: Payer: Self-pay | Admitting: Adult Health

## 2022-08-16 ENCOUNTER — Ambulatory Visit (INDEPENDENT_AMBULATORY_CARE_PROVIDER_SITE_OTHER): Payer: Managed Care, Other (non HMO) | Admitting: Adult Health

## 2022-08-16 VITALS — BP 120/84 | HR 75 | Temp 98.2°F | Ht 70.0 in | Wt 175.0 lb

## 2022-08-16 DIAGNOSIS — I341 Nonrheumatic mitral (valve) prolapse: Secondary | ICD-10-CM

## 2022-08-16 DIAGNOSIS — E782 Mixed hyperlipidemia: Secondary | ICD-10-CM

## 2022-08-16 DIAGNOSIS — S29012A Strain of muscle and tendon of back wall of thorax, initial encounter: Secondary | ICD-10-CM | POA: Diagnosis not present

## 2022-08-16 DIAGNOSIS — Z0000 Encounter for general adult medical examination without abnormal findings: Secondary | ICD-10-CM

## 2022-08-16 DIAGNOSIS — Z0001 Encounter for general adult medical examination with abnormal findings: Secondary | ICD-10-CM | POA: Diagnosis not present

## 2022-08-16 DIAGNOSIS — N401 Enlarged prostate with lower urinary tract symptoms: Secondary | ICD-10-CM | POA: Diagnosis not present

## 2022-08-16 DIAGNOSIS — Z91030 Bee allergy status: Secondary | ICD-10-CM

## 2022-08-16 DIAGNOSIS — R351 Nocturia: Secondary | ICD-10-CM | POA: Diagnosis not present

## 2022-08-16 LAB — LIPID PANEL
Cholesterol: 170 mg/dL (ref 0–200)
HDL: 61.6 mg/dL (ref >39.00)
LDL Cholesterol: 92 mg/dL (ref 0–99)
NonHDL: 108.79
Total CHOL/HDL Ratio: 3
Triglycerides: 82 mg/dL (ref 0.0–149.0)
VLDL: 16.4 mg/dL (ref 0.0–40.0)

## 2022-08-16 LAB — COMPREHENSIVE METABOLIC PANEL
ALT: 23 U/L (ref 0–53)
AST: 20 U/L (ref 0–37)
Albumin: 4.3 g/dL (ref 3.5–5.2)
Alkaline Phosphatase: 68 U/L (ref 39–117)
BUN: 14 mg/dL (ref 6–23)
CO2: 29 mEq/L (ref 19–32)
Calcium: 9.6 mg/dL (ref 8.4–10.5)
Chloride: 105 mEq/L (ref 96–112)
Creatinine, Ser: 0.96 mg/dL (ref 0.40–1.50)
GFR: 82.55 mL/min (ref >60.00)
Glucose, Bld: 95 mg/dL (ref 70–99)
Potassium: 4.3 mEq/L (ref 3.5–5.1)
Sodium: 141 mEq/L (ref 135–145)
Total Bilirubin: 1 mg/dL (ref 0.2–1.2)
Total Protein: 7.1 g/dL (ref 6.0–8.3)

## 2022-08-16 LAB — TSH: TSH: 2.26 u[IU]/mL (ref 0.35–5.50)

## 2022-08-16 LAB — CBC
HCT: 46.3 % (ref 39.0–52.0)
Hemoglobin: 15.1 g/dL (ref 13.0–17.0)
MCHC: 32.6 g/dL (ref 30.0–36.0)
MCV: 86.8 fl (ref 78.0–100.0)
Platelets: 242 10*3/uL (ref 150.0–400.0)
RBC: 5.34 Mil/uL (ref 4.22–5.81)
RDW: 12.9 % (ref 11.5–15.5)
WBC: 5.3 10*3/uL (ref 4.0–10.5)

## 2022-08-16 LAB — PSA: PSA: 1.56 ng/mL (ref 0.10–4.00)

## 2022-08-16 MED ORDER — TAMSULOSIN HCL 0.4 MG PO CAPS
0.4000 mg | ORAL_CAPSULE | Freq: Every day | ORAL | 3 refills | Status: DC
Start: 2022-08-16 — End: 2023-09-26

## 2022-08-16 MED ORDER — EPINEPHRINE 0.3 MG/0.3ML IJ SOAJ
0.3000 mg | INTRAMUSCULAR | 1 refills | Status: AC | PRN
Start: 2022-08-16 — End: ?

## 2022-08-16 MED ORDER — PREDNISONE 50 MG PO TABS
ORAL_TABLET | ORAL | 1 refills | Status: DC
Start: 2022-08-16 — End: 2023-10-17

## 2022-08-16 MED ORDER — ATORVASTATIN CALCIUM 20 MG PO TABS
20.0000 mg | ORAL_TABLET | ORAL | 3 refills | Status: DC
Start: 2022-08-16 — End: 2023-11-21

## 2022-08-16 NOTE — Progress Notes (Signed)
Subjective:    Patient ID: Alan Mcguire, male    DOB: 1955/09/14, 66 y.o.   MRN: [id number]  HPI Patient presents for yearly preventative medicine examination. He is a pleasant 67 year old male who  has a past medical history of Chronic lower back pain, Hyperlipidemia, Lumbar disc disease, Murmur (9/92011), MVP (mitral valve prolapse), Non-rheumatic mitral regurgitation, Pneumonia (1990s), and S/P minimally invasive mitral valve repair (01/24/2017).  History of mitral valve prolapse-s/p repair in 2018.  Continues to do well after surgery.  Denies shortness of breath, DOE, chest pain, or exercise tolerance.  He is seen by cardiology yearly.  Takes 81 mg aspirin  Hyperlipidemia-takes Lipitor 20 mg every other day.  With this regimen he does not have any myalgia or fatigue Lab Results  Component Value Date   CHOL 198 07/08/2021   HDL 66.00 07/08/2021   LDLCALC 114 (H) 07/08/2021   LDLDIRECT 123.4 06/20/2011   TRIG 93.0 07/08/2021   CHOLHDL 3 07/08/2021   BPH - uses Flomax 0.4 mg due to symptoms of nocturia. He reports that he is now getting up once a night to urinate. He does not feel like his stream is as strong.   Left sided thoracic back pain -he has had for multiple years.  He does play golf and workout pain is located along the scapular region along the medial border of the scapula.  He does do routine stretching, has been seen by sports medicine.  Continues to feel muscle strain at times.  He is interested in physical therapy  All immunizations and health maintenance protocols were reviewed with the patient and needed orders were placed.  Appropriate screening laboratory values were ordered for the patient including screening of hyperlipidemia, renal function and hepatic function. If indicated by BPH, a PSA was ordered.  Medication reconciliation,  past medical history, social history, problem list and allergies were reviewed in detail with the patient  Goals were established  with regard to weight loss, exercise, and  diet in compliance with medications Wt Readings from Last 3 Encounters:  08/16/22 175 lb (79.4 kg)  04/22/22 187 lb (84.8 kg)  07/08/21 182 lb (82.6 kg)   He is up to date on routine colon cancer screening    Review of Systems  Constitutional: Negative.   HENT: Negative.    Eyes: Negative.   Respiratory: Negative.    Cardiovascular: Negative.   Gastrointestinal: Negative.   Endocrine: Negative.   Genitourinary: Negative.   Musculoskeletal: Negative.   Skin: Negative.   Allergic/Immunologic: Negative.   Neurological: Negative.   Hematological: Negative.   Psychiatric/Behavioral: Negative.    All other systems reviewed and are negative.  Past Medical History:  Diagnosis Date   Chronic lower back pain    Hyperlipidemia    borderline   Lumbar disc disease    L4-5   Murmur 9/92011   echo-MVP posterior leaflet with moderate mitral insufficiency and the jet directed anteriorly,left atrium mildly enlarged   MVP (mitral valve prolapse)    Non-rheumatic mitral regurgitation    Pneumonia 1990s   hx walking pneumonia   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   Complex valvuloplasty including triangular resection of flail segment of posterior leaflet, artificial Gore-tex neochord placement x4 and 34 mm Sorin Memo 3D ring annuloplasty via right mini thoracotomy approach    Social History   Socioeconomic History   Marital status: Married    Spouse name: Not on file   Number of children: Not on  file   Years of education: Not on file   Highest education level: Not on file  Occupational History   Not on file  Tobacco Use   Smoking status: Never   Smokeless tobacco: Never  Vaping Use   Vaping Use: Never used  Substance and Sexual Activity   Alcohol use: No   Drug use: No   Sexual activity: Not on file  Other Topics Concern   Not on file  Social History Narrative   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    He coaches swimming   Social Determinants of Corporate investment banker Strain: Not on file  Food Insecurity: Not on file  Transportation Needs: Not on file  Physical Activity: Not on file  Stress: Not on file  Social Connections: Not on file  Intimate Partner Violence: Not on file    Past Surgical History:  Procedure Laterality Date   CARDIAC CATHETERIZATION     COLONOSCOPY     MITRAL VALVE REPAIR Right 01/24/2017   Procedure: MINIMALLY INVASIVE MITRAL VALVE REPAIR (MVR);  Surgeon: Purcell Nails, MD;  Location: [facility] OR;  Service: Open Heart Surgery;  Laterality: Right;   POLYPECTOMY     RIGHT/LEFT HEART CATH AND CORONARY ANGIOGRAPHY N/A 10/28/2016   Procedure: Right/Left Heart Cath and Coronary Angiography;  Surgeon: Swaziland, [name], MD;  Location: [facility] INVASIVE CV LAB;  Service: Cardiovascular;  Laterality: N/A;   TEE WITHOUT CARDIOVERSION N/A 09/21/2016   Procedure: TRANSESOPHAGEAL ECHOCARDIOGRAM (TEE);  Surgeon: Chilton Si, MD;  Location: [facility] ENDOSCOPY;  Service: Cardiovascular;  Laterality: N/A;   TEE WITHOUT CARDIOVERSION N/A 01/24/2017   Procedure: TRANSESOPHAGEAL ECHOCARDIOGRAM (TEE);  Surgeon: Purcell Nails, MD;  Location: [facility] OR;  Service: Open Heart Surgery;  Laterality: N/A;   VASECTOMY     WISDOM TOOTH EXTRACTION      Family History  Problem Relation Age of Onset   Hypertension Mother    Lung cancer Mother    Kidney failure Father        died   Kidney disease Father    Colon cancer Neg Hx    Colon polyps Neg Hx    Esophageal cancer Neg Hx    Rectal cancer Neg Hx    Stomach cancer Neg Hx     Allergies  Allergen Reactions   Bee Venom Anaphylaxis    Current Outpatient Medications on File Prior to Visit  Medication Sig Dispense Refill   aspirin 81 MG tablet Take 81 mg by mouth daily.     atorvastatin (LIPITOR) 20 MG tablet TAKE 1 TABLET BY MOUTH EVERY OTHER DAY  45 tablet 3   Multiple Vitamin (MULTIVITAMIN) capsule Take 1 capsule by mouth 3 (three) times a week.      tamsulosin (FLOMAX) 0.4 MG CAPS capsule Take 1 capsule (0.4 mg total) by mouth daily. 30 capsule 0   amoxicillin (AMOXIL) 500 MG capsule Take 500 mg by mouth 3 (three) times daily. (Patient not taking: Reported on 08/16/2022)     No current facility-administered medications on file prior to visit.    BP 120/84   Pulse 75   Temp 98.2 F (36.8 C) (Oral)   Ht 5\' 10"  (1.778 m)   Wt 175 lb (79.4 kg)   SpO2 98%   BMI 25.11 kg/m       Objective:  Physical Exam Vitals and nursing note reviewed.  Constitutional:      General: He is not in acute distress.    Appearance: Normal appearance. He is not ill-appearing.  HENT:     Head: Normocephalic and atraumatic.     Right Ear: Tympanic membrane, ear canal and external ear normal. There is no impacted cerumen.     Left Ear: Tympanic membrane, ear canal and external ear normal. There is no impacted cerumen.     Nose: Nose normal. No congestion or rhinorrhea.     Mouth/Throat:     Mouth: Mucous membranes are moist.     Pharynx: Oropharynx is clear.  Eyes:     Extraocular Movements: Extraocular movements intact.     Conjunctiva/sclera: Conjunctivae normal.     Pupils: Pupils are equal, round, and reactive to light.  Neck:     Vascular: No carotid bruit.  Cardiovascular:     Rate and Rhythm: Normal rate and regular rhythm.     Pulses: Normal pulses.     Heart sounds: No murmur heard.    No friction rub. No gallop.  Pulmonary:     Effort: Pulmonary effort is normal.     Breath sounds: Normal breath sounds.  Abdominal:     General: Abdomen is flat. Bowel sounds are normal. There is no distension.     Palpations: Abdomen is soft. There is no mass.     Tenderness: There is no abdominal tenderness. There is no guarding or rebound.     Hernia: No hernia is present.  Musculoskeletal:        General: Tenderness present. No swelling.  Normal range of motion.     Cervical back: Normal range of motion and neck supple.     Comments: Tender palpation left thoracic paraspinal musculature. Subcutaneous mass overlying left scapula nontender mobile and consistent in appearance to palpation with lipoma.   Lymphadenopathy:     Cervical: No cervical adenopathy.  Skin:    General: Skin is warm and dry.     Capillary Refill: Capillary refill takes less than 2 seconds.  Neurological:     General: No focal deficit present.     Mental Status: He is alert and oriented to person, place, and time.  Psychiatric:        Mood and Affect: Mood normal.        Behavior: Behavior normal.        Thought Content: Thought content normal.        Judgment: Judgment normal.       Assessment & Plan:  1. Routine general medical examination at a health care facility Today patient counseled on age appropriate routine health concerns for screening and prevention, each reviewed and up to date or declined. Immunizations reviewed and up to date or declined. Labs ordered and reviewed. Risk factors for depression reviewed and negative. Hearing function and visual acuity are intact. ADLs screened and addressed as needed. Functional ability and level of safety reviewed and appropriate. Education, counseling and referrals performed based on assessed risks today. Patient provided with a copy of personalized plan for preventive services. - Follow up in one year or sooner if needed  2. MVP (mitral valve prolapse) - Per cardiology  - Lipid panel; Future - TSH; Future - CBC; Future - Comprehensive metabolic panel; Future - Comprehensive metabolic panel - CBC - TSH - Lipid panel  3. Benign prostatic hyperplasia with nocturia - Continue flomax  - PSA; Future - tamsulosin (FLOMAX) 0.4 MG  CAPS capsule; Take 1 capsule (0.4 mg total) by mouth daily.  Dispense: 90 capsule; Refill: 3 - PSA  4. Mixed hyperlipidemia - Continue lipitor 20 mg every other day  -  Lipid panel; Future - TSH; Future - CBC; Future - Comprehensive metabolic panel; Future - atorvastatin (LIPITOR) 20 MG tablet; Take 1 tablet (20 mg total) by mouth every other day.  Dispense: 45 tablet; Refill: 3 - Comprehensive metabolic panel - CBC - TSH - Lipid panel  5. History of bee sting allergy  - EPINEPHrine 0.3 mg/0.3 mL IJ SOAJ injection; Inject 0.3 mg into the muscle as needed for anaphylaxis.  Dispense: 2 each; Refill: 1 - predniSONE (DELTASONE) 50 MG tablet; Use as needed  Dispense: 10 tablet; Refill: 1  6. Strain of rhomboid muscle, initial encounter  - Ambulatory referral to Physical Therapy  Shirline Frees, NP

## 2022-08-16 NOTE — Patient Instructions (Addendum)
It was great seeing you today   We will follow up with you regarding your lab work   Please let me know if you need anything   Someone from PT will call you

## 2023-04-14 NOTE — Progress Notes (Deleted)
 Alan Mcguire Date of Birth: 01-26-1956 Medical Record #[id number]  History of Present Illness: Alan Mcguire is seen  for followup of mitral valve prolapse s/p MV repair. Last seen March 2019.  He has a history of mitral valve prolapse with moderate mitral insufficiency. He was seen in 2018 with  worsening murmur. Echo was done suggesting severe MR. TEE was then performed and showed MVP with severe MR. He subsequently underwent cardiac cath showing normal coronary anatomy and LV function. Normal right heart pressures. He underwent minimally invasive mitral valve repair on 01/24/2017 with complex valvuloplasty including triangular resection of the flail segment of the posterior leaflet. His surgical course progressed appropriately and he was diuresed following the procedure. He maintained NSR throughout admission. Hgb was stable at 13.3 at the time of discharge. On follow up he did complain of some fatigue and metoprolol  dose was reduced. Follow up Echo showed a good repair. He has been switched from Coumadin  to ASA.   On follow up today he states he is doing very well. He denies any dyspnea, palpitations, or chest pain. 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   Current Outpatient Medications on File Prior to Visit  Medication Sig Dispense Refill   amoxicillin  (AMOXIL ) 500 MG capsule Take 500 mg by mouth 3 (three) times daily. (Patient not taking: Reported on 08/16/2022)     aspirin  81 MG tablet Take 81 mg by mouth daily.     atorvastatin  (LIPITOR) 20 MG tablet Take 1 tablet (20 mg total) by mouth every other day. 45 tablet 3   EPINEPHrine  0.3 mg/0.3 mL IJ SOAJ injection Inject 0.3 mg into the muscle as needed for anaphylaxis. 2 each 1   Multiple Vitamin (MULTIVITAMIN) capsule Take 1 capsule by mouth 3 (three) times a week.      predniSONE  (DELTASONE ) 50 MG tablet Use as needed 10 tablet 1   No current facility-administered medications on file prior to visit.    Allergies  Allergen  Reactions   Bee Venom Anaphylaxis    Past Medical History:  Diagnosis Date   Chronic lower back pain    Hyperlipidemia    borderline   Lumbar disc disease    L4-5   Murmur 9/92011   echo-MVP posterior leaflet with moderate mitral insufficiency and the jet directed anteriorly,left atrium mildly enlarged   MVP (mitral valve prolapse)    Non-rheumatic mitral regurgitation    Pneumonia 1990s   hx walking pneumonia   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   Complex valvuloplasty including triangular resection of flail segment of posterior leaflet, artificial Gore-tex neochord placement x4 and 34 mm Sorin Memo 3D ring annuloplasty via right mini thoracotomy approach    Past Surgical History:  Procedure Laterality Date   CARDIAC CATHETERIZATION     COLONOSCOPY     MITRAL VALVE REPAIR Right 01/24/2017   Procedure: MINIMALLY INVASIVE MITRAL VALVE REPAIR (MVR);  Surgeon: Dusty Sudie DEL, MD;  Location: [facility] OR;  Service: Open Heart Surgery;  Laterality: Right;   POLYPECTOMY     RIGHT/LEFT HEART CATH AND CORONARY ANGIOGRAPHY N/A 10/28/2016   Procedure: Right/Left Heart Cath and Coronary Angiography;  Surgeon: Deserai Cansler M, MD;  Location: [facility] INVASIVE CV LAB;  Service: Cardiovascular;  Laterality: N/A;   TEE WITHOUT CARDIOVERSION N/A 09/21/2016   Procedure: TRANSESOPHAGEAL ECHOCARDIOGRAM (TEE);  Surgeon: Raford Riggs, MD;  Location: [facility] ENDOSCOPY;  Service: Cardiovascular;  Laterality: N/A;   TEE WITHOUT CARDIOVERSION N/A 01/24/2017   Procedure: TRANSESOPHAGEAL ECHOCARDIOGRAM (TEE);  Surgeon: Dusty Sudie DEL, MD;  Location: [facility] OR;  Service: Open Heart Surgery;  Laterality: N/A;   VASECTOMY     WISDOM TOOTH EXTRACTION      Social History   Tobacco Use  Smoking Status Never  Smokeless Tobacco Never    Social History   Substance and Sexual Activity  Alcohol Use No    Family History  Problem Relation Age of Onset   Hypertension Mother    Lung cancer Mother     Kidney failure Father        died   Kidney disease Father    Colon cancer Neg Hx    Colon polyps Neg Hx    Esophageal cancer Neg Hx    Rectal cancer Neg Hx    Stomach cancer Neg Hx     Review of Systems: As noted in history of present illness .  All other systems were reviewed and are negative.  Physical Exam: There were no vitals taken for this visit. GENERAL:  Well appearing, WM in NAD HEENT:  PERRL, EOMI, sclera are clear. Oropharynx is clear. NECK:  No jugular venous distention, carotid upstroke brisk and symmetric, no bruits, no thyromegaly or adenopathy LUNGS:  Clear to auscultation bilaterally CHEST:  Surgical incisions have healed very well. HEART:  RRR,  PMI not displaced or sustained,S1 and S2 within normal limits, no S3, no S4: no clicks, no rubs, no murmurs ABD:  Soft, nontender. BS +, no masses or bruits. No hepatomegaly, no splenomegaly EXT:  2 + pulses throughout, no edema, no cyanosis no clubbing SKIN:  Warm and dry.  No rashes NEURO:  Alert and oriented x 3. Cranial nerves II through XII intact. PSYCH:  Cognitively intact    LABORATORY DATA:  Lab Results  Component Value Date   WBC 5.3 08/16/2022   HGB 15.1 08/16/2022   HCT 46.3 08/16/2022   PLT 242.0 08/16/2022   GLUCOSE 95 08/16/2022   CHOL 170 08/16/2022   TRIG 82.0 08/16/2022   HDL 61.60 08/16/2022   LDLDIRECT 123.4 06/20/2011   LDLCALC 92 08/16/2022   ALT 23 08/16/2022   AST 20 08/16/2022   NA 141 08/16/2022   K 4.3 08/16/2022   CL 105 08/16/2022   CREATININE 0.96 08/16/2022   BUN 14 08/16/2022   CO2 29 08/16/2022   TSH 2.26 08/16/2022   PSA 1.56 08/16/2022   INR 2.4 05/11/2017   HGBA1C 5.3 01/23/2017   Ecg today shows NSR with occ PVC. Rate 80. Otherwise normal. I have personally reviewed and interpreted this study.   Echo: 09/08/16: Study Conclusions   - Left ventricle: The cavity size was mildly dilated. Systolic   function was normal. The estimated ejection fraction was in  the   range of 60% to 65%. Wall motion was normal; there were no   regional wall motion abnormalities. Features are consistent with   a pseudonormal left ventricular filling pattern, with concomitant   abnormal relaxation and increased filling pressure (grade 2   diastolic dysfunction). Doppler parameters are consistent with   elevated ventricular end-diastolic filling pressure. - Aortic valve: There was mild regurgitation. - Mitral valve: There was moderate to severe regurgitation. - Left atrium: The atrium was moderately dilated. - Right ventricle: Systolic function was normal. - Tricuspid valve: There was moderate regurgitation. - Pulmonary arteries: Systolic pressure was mildly increased. PA   peak pressure: 43 mm Hg (S).   Impressions:   - Myxomatous, thickened mitral valve leaflets with bileaflet   prolapse  posterior >> anterior. There is anteriorly directed jet,   mitral regurgitation is at least moderate to severe, most   probably severe, there is systolic anterior flow reversal in the   pulmonary vein, moderate pulmonary hypertension.   A TEE is recommended for further evaluation.    TEE 09/21/16:Study Conclusions   - Left ventricle: Systolic function was normal. The estimated   ejection fraction was in the range of 60% to 65%. Wall motion was   normal; there were no regional wall motion abnormalities. - Aortic valve: There was mild regurgitation. - Mitral valve: Severe thickening of the anterior leaflet and   posterior leaflet, consistent with myxomatous proliferation.   Severe, late systolic prolapse, involving the posterior leaflet   and mild prolapse of the anterior leaflet. There was severe,   mild-late systolic regurgitation directed eccentrically and   toward the septum. Severe regurgitation is suggested by an   effective regurgitant orifice >= 0.40 cm^2, a regurgitant volume   >= 60 ml, pulmonary vein systolic flow reversal, and Coanda   effect. Late systolic  flow reversal was noted in the right upper   pulmonary vein. Valve area by continuity equation (using LVOT   flow): 1.78 cm^2. - Left atrium: No evidence of thrombus in the atrial cavity or   appendage. No evidence of thrombus in the atrial cavity or   appendage. - Right atrium: No evidence of thrombus in the atrial cavity or   appendage. - Atrial septum: No defect or patent foramen ovale was identified   by color flow Doppler.  Cardiac cath 10/28/16: Procedures   Right/Left Heart Cath and Coronary Angiography  Conclusion     The left ventricular systolic function is normal. The left ventricular ejection fraction is 55-65% by visual estimate. There is no aortic valve stenosis. There is severe (4+) mitral regurgitation and moderate mitral valve prolapse. Normal coronary anatomy   1. Normal coronary anatomy 2. Normal LV function 3. Normal right heart pressures. 4. Normal PCWP with mildly elevated LVEDP 5. Normal cardiac output.    Plan: consider for MV repair.      Echo 05/15/17: Study Conclusions   - Left ventricle: The cavity size was normal. Wall thickness was   increased in a pattern of mild LVH. Systolic function was normal.   The estimated ejection fraction was in the range of 50% to 55%.   Wall motion was normal; there were no regional wall motion   abnormalities. Doppler parameters are consistent with abnormal   left ventricular relaxation (grade 1 diastolic dysfunction).   Doppler parameters are consistent with high ventricular filling   pressure. - Aortic valve: There was trivial regurgitation. - Mitral valve: Prior procedures included surgical repair.   Impressions:   - Normal LV systolic function; mild diastolic dysfunction; mild   LVH; trace AI; s/p MV repair with mean gradient of 3 mmHg and no   MR.   Assessment / Plan: 1. Mitral valve prolapse with severe mitral insufficiency by Echo and TEE. Evidence of pulmonary HTN and LAE by Echo.  Now s/p MV repair  in NOv 2018 with excellent result clinically and by Echo. Exam is normal today.  Continue ASA. Follow up in one  year.

## 2023-04-18 ENCOUNTER — Ambulatory Visit: Payer: Managed Care, Other (non HMO) | Admitting: Cardiology

## 2023-04-21 NOTE — Progress Notes (Signed)
 Alan Mcguire Date of Birth: [birth year] Medical Record #[id number]  History of Present Illness: Alan Mcguire is seen  for followup of mitral valve prolapse s/p MV repair. Last seen March 2019.  He has a history of mitral valve prolapse with moderate mitral insufficiency. He was seen in 2018 with  worsening murmur. Echo was done suggesting severe MR. TEE was then performed and showed MVP with severe MR. He subsequently underwent cardiac cath showing normal coronary anatomy and LV function. Normal right heart pressures. He underwent minimally invasive mitral valve repair on 01/24/2017 with complex valvuloplasty including triangular resection of the flail segment of the posterior leaflet. His surgical course progressed appropriately and he was diuresed following the procedure. He maintained NSR throughout admission. Hgb was stable at 13.3 at the time of discharge. On follow up he did complain of some fatigue and metoprolol  dose was reduced. Follow up Echo showed a good repair. He has been switched from Coumadin  to ASA.   On follow up today he states he is doing very well. He exercises regulary. Notes a little dyspnea with higher cardio levels but thinks this is more age. No palpitations or chest pain. 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   Current Outpatient Medications on File Prior to Visit  Medication Sig Dispense Refill   amoxicillin  (AMOXIL ) 500 MG capsule Take 500 mg by mouth 3 (three) times daily.     aspirin  81 MG tablet Take 81 mg by mouth daily.     atorvastatin  (LIPITOR) 20 MG tablet Take 1 tablet (20 mg total) by mouth every other day. 45 tablet 3   EPINEPHrine  0.3 mg/0.3 mL IJ SOAJ injection Inject 0.3 mg into the muscle as needed for anaphylaxis. 2 each 1   Multiple Vitamin (MULTIVITAMIN) capsule Take 1 capsule by mouth 3 (three) times a week.      predniSONE  (DELTASONE ) 50 MG tablet Use as needed 10 tablet 1   No current facility-administered medications on file prior to  visit.    Allergies  Allergen Reactions   Bee Venom Anaphylaxis    Past Medical History:  Diagnosis Date   Chronic lower back pain    Hyperlipidemia    borderline   Lumbar disc disease    L4-5   Murmur 9/92011   echo-MVP posterior leaflet with moderate mitral insufficiency and the jet directed anteriorly,left atrium mildly enlarged   MVP (mitral valve prolapse)    Non-rheumatic mitral regurgitation    Pneumonia 1990s   hx walking pneumonia   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   Complex valvuloplasty including triangular resection of flail segment of posterior leaflet, artificial Gore-tex neochord placement x4 and 34 mm Sorin Memo 3D ring annuloplasty via right mini thoracotomy approach    Past Surgical History:  Procedure Laterality Date   CARDIAC CATHETERIZATION     COLONOSCOPY     MITRAL VALVE REPAIR Right 01/24/2017   Procedure: MINIMALLY INVASIVE MITRAL VALVE REPAIR (MVR);  Surgeon: Gardenia Jump, MD;  Location[facility];  Service: Open Heart Surgery;  Laterality: Right;   POLYPECTOMY     RIGHT/LEFT HEART CATH AND CORONARY ANGIOGRAPHY N/A 10/28/2016   Procedure: Right/Left Heart Cath and Coronary Angiography;  Surgeon: [place], Timathy Newberry M, MD;  Location: [facility] INVASIVE CV LAB;  Service: Cardiovascular;  Laterality: N/A;   TEE WITHOUT CARDIOVERSION N/A 09/21/2016   Procedure: TRANSESOPHAGEAL ECHOCARDIOGRAM (TEE);  Surgeon: Maudine Sos, MD;  Location: [facility] ENDOSCOPY;  Service: Cardiovascular;  Laterality: N/A;   TEE WITHOUT CARDIOVERSION  N/A 01/24/2017   Procedure: TRANSESOPHAGEAL ECHOCARDIOGRAM (TEE);  Surgeon: Gardenia Jump, MD;  Location: [facility] OR;  Service: Open Heart Surgery;  Laterality: N/A;   VASECTOMY     WISDOM TOOTH EXTRACTION      Social History   Tobacco Use  Smoking Status Never  Smokeless Tobacco Never    Social History   Substance and Sexual Activity  Alcohol Use No    Family History  Problem Relation Age of Onset    Hypertension Mother    Lung cancer Mother    Kidney failure Father        died   Kidney disease Father    Colon cancer Neg Hx    Colon polyps Neg Hx    Esophageal cancer Neg Hx    Rectal cancer Neg Hx    Stomach cancer Neg Hx     Review of Systems: As noted in history of present illness .  All other systems were reviewed and are negative.  Physical Exam: BP 130/84   Pulse 72   Ht 5\' 11"  (1.803 m)   Wt 192 lb 6.4 oz (87.3 kg)   SpO2 97%   BMI 26.83 kg/m  GENERAL:  Well appearing, WM in NAD HEENT:  PERRL, EOMI, sclera are clear. Oropharynx is clear. NECK:  No jugular venous distention, carotid upstroke brisk and symmetric, no bruits, no thyromegaly or adenopathy LUNGS:  Clear to auscultation bilaterally CHEST:  Surgical incisions have healed very well. HEART:  RRR,  PMI not displaced or sustained,S1 and S2 within normal limits, no S3, no S4: no clicks, no rubs, no murmurs ABD:  Soft, nontender. BS +, no masses or bruits. No hepatomegaly, no splenomegaly EXT:  2 + pulses throughout, no edema, no cyanosis no clubbing SKIN:  Warm and dry.  No rashes NEURO:  Alert and oriented x 3. Cranial nerves II through XII intact. PSYCH:  Cognitively intact    LABORATORY DATA:  Lab Results  Component Value Date   WBC 5.3 08/16/2022   HGB 15.1 08/16/2022   HCT 46.3 08/16/2022   PLT 242.0 08/16/2022   GLUCOSE 95 08/16/2022   CHOL 170 08/16/2022   TRIG 82.0 08/16/2022   HDL 61.60 08/16/2022   LDLDIRECT 123.4 06/20/2011   LDLCALC 92 08/16/2022   ALT 23 08/16/2022   AST 20 08/16/2022   NA 141 08/16/2022   K 4.3 08/16/2022   CL 105 08/16/2022   CREATININE 0.96 08/16/2022   BUN 14 08/16/2022   CO2 29 08/16/2022   TSH 2.26 08/16/2022   PSA 1.56 08/16/2022   INR 2.4 05/11/2017   HGBA1C 5.3 01/23/2017   EKG Interpretation Date/Time:  Monday April 24 2023 10:45:24 EST Ventricular Rate:  72 PR Interval:  188 QRS Duration:  94 QT Interval:  384 QTC Calculation: 420 R  Axis:   0  Text Interpretation: Normal sinus rhythm Possible Left atrial enlargement When compared with ECG of Apr 22, 2022  no PVCs seen Confirmed by [place], Vir Whetstine ([id number]) on 04/24/2023 10:47:25 AM     Echo: 09/08/16: Study Conclusions   - Left ventricle: The cavity size was mildly dilated. Systolic   function was normal. The estimated ejection fraction was in the   range of 60% to 65%. Wall motion was normal; there were no   regional wall motion abnormalities. Features are consistent with   a pseudonormal left ventricular filling pattern, with concomitant   abnormal relaxation and increased filling pressure (grade 2   diastolic dysfunction). Doppler parameters  are consistent with   elevated ventricular end-diastolic filling pressure. - Aortic valve: There was mild regurgitation. - Mitral valve: There was moderate to severe regurgitation. - Left atrium: The atrium was moderately dilated. - Right ventricle: Systolic function was normal. - Tricuspid valve: There was moderate regurgitation. - Pulmonary arteries: Systolic pressure was mildly increased. PA   peak pressure: 43 mm Hg (S).   Impressions:   - Myxomatous, thickened mitral valve leaflets with bileaflet   prolapse posterior >> anterior. There is anteriorly directed jet,   mitral regurgitation is at least moderate to severe, most   probably severe, there is systolic anterior flow reversal in the   pulmonary vein, moderate pulmonary hypertension.   A TEE is recommended for further evaluation.    TEE 09/21/16:Study Conclusions   - Left ventricle: Systolic function was normal. The estimated   ejection fraction was in the range of 60% to 65%. Wall motion was   normal; there were no regional wall motion abnormalities. - Aortic valve: There was mild regurgitation. - Mitral valve: Severe thickening of the anterior leaflet and   posterior leaflet, consistent with myxomatous proliferation.   Severe, late systolic prolapse, involving  the posterior leaflet   and mild prolapse of the anterior leaflet. There was severe,   mild-late systolic regurgitation directed eccentrically and   toward the septum. Severe regurgitation is suggested by an   effective regurgitant orifice >= 0.40 cm^2, a regurgitant volume   >= 60 ml, pulmonary vein systolic flow reversal, and Coanda   effect. Late systolic flow reversal was noted in the right upper   pulmonary vein. Valve area by continuity equation (using LVOT   flow): 1.78 cm^2. - Left atrium: No evidence of thrombus in the atrial cavity or   appendage. No evidence of thrombus in the atrial cavity or   appendage. - Right atrium: No evidence of thrombus in the atrial cavity or   appendage. - Atrial septum: No defect or patent foramen ovale was identified   by color flow Doppler.  Cardiac cath 10/28/16: Procedures   Right/Left Heart Cath and Coronary Angiography  Conclusion     The left ventricular systolic function is normal. The left ventricular ejection fraction is 55-65% by visual estimate. There is no aortic valve stenosis. There is severe (4+) mitral regurgitation and moderate mitral valve prolapse. Normal coronary anatomy   1. Normal coronary anatomy 2. Normal LV function 3. Normal right heart pressures. 4. Normal PCWP with mildly elevated LVEDP 5. Normal cardiac output.    Plan: consider for MV repair.      Echo 05/15/17: Study Conclusions   - Left ventricle: The cavity size was normal. Wall thickness was   increased in a pattern of mild LVH. Systolic function was normal.   The estimated ejection fraction was in the range of 50% to 55%.   Wall motion was normal; there were no regional wall motion   abnormalities. Doppler parameters are consistent with abnormal   left ventricular relaxation (grade 1 diastolic dysfunction).   Doppler parameters are consistent with high ventricular filling   pressure. - Aortic valve: There was trivial regurgitation. - Mitral  valve: Prior procedures included surgical repair.   Impressions:   - Normal LV systolic function; mild diastolic dysfunction; mild   LVH; trace AI; s/p MV repair with mean gradient of 3 mmHg and no   MR.   Assessment / Plan: 1. Mitral valve prolapse with severe mitral insufficiency by Echo and TEE. Evidence of pulmonary HTN  and LAE by Echo.  Now s/p MV repair in Nov 2018 with excellent result clinically and by Echo in 1019. Exam is normal.   Will follow up in one year

## 2023-04-24 ENCOUNTER — Encounter: Payer: Self-pay | Admitting: Cardiology

## 2023-04-24 ENCOUNTER — Ambulatory Visit: Payer: Managed Care, Other (non HMO) | Attending: Cardiology | Admitting: Cardiology

## 2023-04-24 VITALS — BP 130/84 | HR 72 | Ht 71.0 in | Wt 192.4 lb

## 2023-04-24 DIAGNOSIS — Z9889 Other specified postprocedural states: Secondary | ICD-10-CM

## 2023-04-24 DIAGNOSIS — R0609 Other forms of dyspnea: Secondary | ICD-10-CM | POA: Diagnosis not present

## 2023-04-24 DIAGNOSIS — I341 Nonrheumatic mitral (valve) prolapse: Secondary | ICD-10-CM | POA: Diagnosis not present

## 2023-04-24 NOTE — Patient Instructions (Addendum)
 Medication Instructions:  Continue same medications *If you need a refill on your cardiac medications before your next appointment, please call your pharmacy*   Lab Work: None ordered   Testing/Procedures: None ordered   Follow-Up: At [facility], you and your health needs are our priority.  As part of our continuing mission to provide you with exceptional heart care, we have created designated Provider Care Teams.  These Care Teams include your primary Cardiologist (physician) and Advanced Practice Providers (APPs -  Physician Assistants and Nurse Practitioners) who all work together to provide you with the care you need, when you need it.  We recommend signing up for the patient portal called "MyChart".  Sign up information is provided on this After Visit Summary.  MyChart is used to connect with patients for Virtual Visits (Telemedicine).  Patients are able to view lab/test results, encounter notes, upcoming appointments, etc.  Non-urgent messages can be sent to your provider as well.   To learn more about what you can do with MyChart, go to [email or web address].    Your next appointment:  1 year    Call in Oct to schedule Feb appointment     Provider: Dr.Jordan

## 2023-05-22 ENCOUNTER — Telehealth: Admitting: Physician Assistant

## 2023-05-22 DIAGNOSIS — J329 Chronic sinusitis, unspecified: Secondary | ICD-10-CM

## 2023-05-22 DIAGNOSIS — B9789 Other viral agents as the cause of diseases classified elsewhere: Secondary | ICD-10-CM

## 2023-05-22 MED ORDER — FLUTICASONE PROPIONATE 50 MCG/ACT NA SUSP: 2.0000 | Freq: Every day | NASAL | 0 refills | Status: DC

## 2023-05-22 NOTE — Progress Notes (Signed)
 E-Visit for Sinus Problems  We are sorry that you are not feeling well.  Here is how we plan to help!  Based on what you have shared with me it looks like you have sinusitis.  Sinusitis is inflammation and infection in the sinus cavities of the head.  Based on your presentation I believe you most likely have Acute Viral Sinusitis.This is an infection most likely caused by a virus. There is not specific treatment for viral sinusitis other than to help you with the symptoms until the infection runs its course.  You may use an oral decongestant such as Mucinex D or if you have glaucoma or high blood pressure use plain Mucinex. Saline nasal spray help and can safely be used as often as needed for congestion, I have prescribed: Fluticasone nasal spray two sprays in each nostril once a day  Some authorities believe that zinc sprays or the use of Echinacea may shorten the course of your symptoms.  Sinus infections are not as easily transmitted as other respiratory infection, however we still recommend that you avoid close contact with loved ones, especially the very young and elderly.  Remember to wash your hands thoroughly throughout the day as this is the number one way to prevent the spread of infection!  Home Care: Only take medications as instructed by your medical team. Do not take these medications with alcohol. A steam or ultrasonic humidifier can help congestion.  You can place a towel over your head and breathe in the steam from hot water coming from a faucet. Avoid close contacts especially the very young and the elderly. Cover your mouth when you cough or sneeze. Always remember to wash your hands.  Get Help Right Away If: You develop worsening fever or sinus pain. You develop a severe head ache or visual changes. Your symptoms persist after you have completed your treatment plan.  Make sure you Understand these instructions. Will watch your condition. Will get help right away if you  are not doing well or get worse.   Thank you for choosing an e-visit.  Your e-visit answers were reviewed by a board certified advanced clinical practitioner to complete your personal care plan. Depending upon the condition, your plan could have included both over the counter or prescription medications.  Please review your pharmacy choice. Make sure the pharmacy is open so you can pick up prescription now. If there is a problem, you may contact your provider through [employer] and have the prescription routed to another pharmacy.  Your safety is important to [place]. If you have drug allergies check your prescription carefully.   For the next 24 hours you can use MyChart to ask questions about today's visit, request a non-urgent call back, or ask for a work or school excuse. You will get an email in the next two days asking about your experience. I hope that your e-visit has been valuable and will speed your recovery.  I have spent 5 minutes in review of e-visit questionnaire, review and updating patient chart, medical decision making and response to patient.   Margaretann Loveless, PA-C

## 2023-06-24 ENCOUNTER — Other Ambulatory Visit: Payer: Self-pay | Admitting: Adult Health

## 2023-06-24 DIAGNOSIS — N401 Enlarged prostate with lower urinary tract symptoms: Secondary | ICD-10-CM

## 2023-06-27 NOTE — Telephone Encounter (Signed)
 Per pharmacy this medication has been refilled. Any new request will be placed on file.

## 2023-08-16 ENCOUNTER — Other Ambulatory Visit: Payer: Self-pay | Admitting: Adult Health

## 2023-08-16 DIAGNOSIS — E782 Mixed hyperlipidemia: Secondary | ICD-10-CM

## 2023-08-17 NOTE — Telephone Encounter (Signed)
 Patient need to schedule CPE for more refills.

## 2023-09-22 ENCOUNTER — Telehealth: Payer: Self-pay | Admitting: Adult Health

## 2023-09-22 DIAGNOSIS — N401 Enlarged prostate with lower urinary tract symptoms: Secondary | ICD-10-CM

## 2023-09-22 NOTE — Telephone Encounter (Unsigned)
 Copied from CRM [id number]. Topic: Clinical - Medication Refill >> Sep 22, 2023 11:07 AM Chiquita SQUIBB wrote: Medication:  tamsulosin  tamsulosin  (FLOMAX ) 0.4 MG CAPS capsule   Has the patient contacted their pharmacy? Yes- medication  has no refills (Agent: If no, request that the patient contact the pharmacy for the refill. If patient does not wish to contact the pharmacy document the reason why and proceed with request.) (Agent: If yes, when and what did the pharmacy advise?)  This is the patient's preferred pharmacy:  [facility] - [place], [place] - [address] [place] [address] Phone: [phone] Fax: [phone]  Is this the correct pharmacy for this prescription? Yes If no, delete pharmacy and type the correct one.   Has the prescription been filled recently? No  Is the patient out of the medication? No- has four left and leaving on vacation   Has the patient been seen for an appointment in the last year OR does the patient have an upcoming appointment? Yes  Can we respond through MyChart? Yes  Agent: Please be advised that Rx refills may take up to 3 business days. We ask that you follow-up with your pharmacy.

## 2023-09-26 MED ORDER — TAMSULOSIN HCL 0.4 MG PO CAPS: 0.4000 mg | ORAL_CAPSULE | Freq: Every day | ORAL | 0 refills | Status: DC

## 2023-09-26 NOTE — Telephone Encounter (Signed)
 Rx sent to pharmacy. Tried to call pt to update but no answer.

## 2023-09-26 NOTE — Telephone Encounter (Signed)
Ok to fill flomax

## 2023-10-17 ENCOUNTER — Ambulatory Visit: Admitting: Adult Health

## 2023-10-17 ENCOUNTER — Encounter: Payer: Self-pay | Admitting: Adult Health

## 2023-10-17 VITALS — BP 122/88 | HR 73 | Temp 98.3°F | Ht 70.0 in | Wt 182.0 lb

## 2023-10-17 DIAGNOSIS — N401 Enlarged prostate with lower urinary tract symptoms: Secondary | ICD-10-CM | POA: Diagnosis not present

## 2023-10-17 DIAGNOSIS — R351 Nocturia: Secondary | ICD-10-CM | POA: Diagnosis not present

## 2023-10-17 DIAGNOSIS — H6123 Impacted cerumen, bilateral: Secondary | ICD-10-CM | POA: Diagnosis not present

## 2023-10-17 DIAGNOSIS — E782 Mixed hyperlipidemia: Secondary | ICD-10-CM

## 2023-10-17 DIAGNOSIS — I341 Nonrheumatic mitral (valve) prolapse: Secondary | ICD-10-CM | POA: Diagnosis not present

## 2023-10-17 DIAGNOSIS — Z0000 Encounter for general adult medical examination without abnormal findings: Secondary | ICD-10-CM

## 2023-10-17 LAB — COMPREHENSIVE METABOLIC PANEL WITH GFR
ALT: 24 U/L (ref 0–53)
AST: 20 U/L (ref 0–37)
Albumin: 4.6 g/dL (ref 3.5–5.2)
Alkaline Phosphatase: 83 U/L (ref 39–117)
BUN: 15 mg/dL (ref 6–23)
CO2: 29 meq/L (ref 19–32)
Calcium: 9.8 mg/dL (ref 8.4–10.5)
Chloride: 102 meq/L (ref 96–112)
Creatinine, Ser: 0.98 mg/dL (ref 0.40–1.50)
GFR: 79.87 mL/min (ref >60.00)
Glucose, Bld: 101 mg/dL — ABNORMAL HIGH (ref 70–99)
Potassium: 4.5 meq/L (ref 3.5–5.1)
Sodium: 139 meq/L (ref 135–145)
Total Bilirubin: 0.9 mg/dL (ref 0.2–1.2)
Total Protein: 7.5 g/dL (ref 6.0–8.3)

## 2023-10-17 LAB — CBC
HCT: 48.8 % (ref 39.0–52.0)
Hemoglobin: 16.1 g/dL (ref 13.0–17.0)
MCHC: 33 g/dL (ref 30.0–36.0)
MCV: 85.6 fl (ref 78.0–100.0)
Platelets: 228 K/uL (ref 150.0–400.0)
RBC: 5.69 Mil/uL (ref 4.22–5.81)
RDW: 13.2 % (ref 11.5–15.5)
WBC: 5.1 K/uL (ref 4.0–10.5)

## 2023-10-17 LAB — PSA: PSA: 2.08 ng/mL (ref 0.10–4.00)

## 2023-10-17 LAB — LIPID PANEL
Cholesterol: 206 mg/dL — ABNORMAL HIGH (ref 0–200)
HDL: 70.8 mg/dL (ref >39.00)
LDL Cholesterol: 118 mg/dL — ABNORMAL HIGH (ref 0–99)
NonHDL: 134.94
Total CHOL/HDL Ratio: 3
Triglycerides: 84 mg/dL (ref 0.0–149.0)
VLDL: 16.8 mg/dL (ref 0.0–40.0)

## 2023-10-17 LAB — TSH: TSH: 2.15 u[IU]/mL (ref 0.35–5.50)

## 2023-10-17 NOTE — Patient Instructions (Addendum)
 It was great seeing you today   We will follow up with you regarding your lab work   Please let me know if you need anything   Get some Debrox at the pharmacy and apply to each ear as directed.

## 2023-10-17 NOTE — Progress Notes (Signed)
 Subjective:    Patient ID: Alan Mcguire, male    DOB: 1955/08/16, 68 y.o.   MRN: [id number]  HPI Patient presents for yearly preventative medicine examination. 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  History of mitral valve prolapse-s/p repair in 2018.  Continues to do well after surgery.  Denies shortness of breath, DOE, chest pain, or exercise tolerance.  He is seen by cardiology yearly.  Takes 81 mg aspirin   Hyperlipidemia-takes Lipitor 20 mg every other day.  With this schedule he does not have any myalgia or fatigue Lab Results  Component Value Date   CHOL 170 08/16/2022   HDL 61.60 08/16/2022   LDLCALC 92 08/16/2022   LDLDIRECT 123.4 06/20/2011   TRIG 82.0 08/16/2022   CHOLHDL 3 08/16/2022   BPH - uses Flomax  0.4 mg due to symptoms of nocturia and decreased stream. He reports that he is now getting up once a night to urinate.   Melanoma - reports that he small area of melanoma removed earlier this summer fro his left chest.    All immunizations and health maintenance protocols were reviewed with the patient and needed orders were placed.  Appropriate screening laboratory values were ordered for the patient including screening of hyperlipidemia, renal function and hepatic function. If indicated by BPH, a PSA was ordered.  Medication reconciliation,  past medical history, social history, problem list and allergies were reviewed in detail with the patient  Goals were established with regard to weight loss, exercise, and  diet in compliance with medications. He has been doing weight training 3 times a week. He does eat healthy   He is up to date on routine colon cancer screening   Review of Systems  Constitutional:  Negative.   HENT: Negative.    Eyes: Negative.   Respiratory: Negative.    Cardiovascular: Negative.   Gastrointestinal: Negative.   Endocrine: Negative.   Genitourinary: Negative.   Musculoskeletal: Negative.   Skin: Negative.   Allergic/Immunologic: Negative.   Neurological: Negative.   Hematological: Negative.   Psychiatric/Behavioral: Negative.    All other systems reviewed and are negative.  Past Medical History:  Diagnosis Date   Chronic lower back pain    Hyperlipidemia    borderline   Lumbar disc disease    L4-5   Murmur 9/92011   echo-MVP posterior leaflet with moderate mitral insufficiency and the jet directed anteriorly,left atrium mildly enlarged   MVP (mitral valve prolapse)    Non-rheumatic mitral regurgitation    Pneumonia 1990s   hx walking pneumonia   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   Complex valvuloplasty including triangular resection of flail segment of posterior leaflet, artificial Gore-tex neochord placement x4 and 34 mm Sorin Memo 3D ring annuloplasty via right mini thoracotomy approach    Social History   Socioeconomic History   Marital status: Married    Spouse name: Not on file   Number of children: Not on file   Years of education: Not on file   Highest education level: Not on file  Occupational History   Not on file  Tobacco Use   Smoking status: Never   Smokeless tobacco: Never  Vaping Use   Vaping status: Never Used  Substance and Sexual Activity   Alcohol use: Yes  Alcohol/week: 2.0 standard drinks of alcohol    Types: 2 Cans of beer per week    Comment: on weekends   Drug use: No   Sexual activity: Yes    Partners: Female    Comment: married  Other Topics Concern   Not on file  Social History Narrative   Primary school teacher company    Married    4 kids ( 2 in college and 2 married)    He likes to play golf and work in the yard.    He coaches swimming   Social Drivers of Research scientist (physical sciences) Strain: Not on file  Food Insecurity: Not on file  Transportation Needs: Not on file  Physical Activity: Not on file  Stress: Not on file  Social Connections: Not on file  Intimate Partner Violence: Not on file    Past Surgical History:  Procedure Laterality Date   CARDIAC CATHETERIZATION     COLONOSCOPY     MITRAL VALVE REPAIR Right 01/24/2017   Procedure: MINIMALLY INVASIVE MITRAL VALVE REPAIR (MVR);  Surgeon: Dusty Sudie DEL, MD;  Location: [facility] OR;  Service: Open Heart Surgery;  Laterality: Right;   POLYPECTOMY     RIGHT/LEFT HEART CATH AND CORONARY ANGIOGRAPHY N/A 10/28/2016   Procedure: Right/Left Heart Cath and Coronary Angiography;  Surgeon: Swaziland, [name], MD;  Location: [facility] INVASIVE CV LAB;  Service: Cardiovascular;  Laterality: N/A;   TEE WITHOUT CARDIOVERSION N/A 09/21/2016   Procedure: TRANSESOPHAGEAL ECHOCARDIOGRAM (TEE);  Surgeon: Raford Riggs, MD;  Location: [facility] ENDOSCOPY;  Service: Cardiovascular;  Laterality: N/A;   TEE WITHOUT CARDIOVERSION N/A 01/24/2017   Procedure: TRANSESOPHAGEAL ECHOCARDIOGRAM (TEE);  Surgeon: Dusty Sudie DEL, MD;  Location: [facility] OR;  Service: Open Heart Surgery;  Laterality: N/A;   VASECTOMY     WISDOM TOOTH EXTRACTION      Family History  Problem Relation Age of Onset   Hypertension Mother    Lung cancer Mother    Kidney failure Father        died   Kidney disease Father    Colon cancer Neg Hx    Colon polyps Neg Hx    Esophageal cancer Neg Hx    Rectal cancer Neg Hx    Stomach cancer Neg Hx     Allergies  Allergen Reactions   Bee Venom Anaphylaxis    Current Outpatient Medications on File Prior to Visit  Medication Sig Dispense Refill   aspirin  81 MG tablet Take 81 mg by mouth daily.     atorvastatin  (LIPITOR) 20 MG tablet Take 1 tablet (20 mg total) by mouth every other day. 45 tablet 3   EPINEPHrine  0.3 mg/0.3 mL IJ SOAJ injection Inject 0.3 mg into the muscle as needed for anaphylaxis. 2 each 1   Multiple  Vitamin (MULTIVITAMIN) capsule Take 1 capsule by mouth 3 (three) times a week.      tamsulosin  (FLOMAX ) 0.4 MG CAPS capsule Take 1 capsule (0.4 mg total) by mouth daily. 90 capsule 0   No current facility-administered medications on file prior to visit.    BP 122/88   Pulse 73   Temp 98.3 F (36.8 C) (Oral)   Ht 5' 10 (1.778 m)   Wt 182 lb (82.6 kg)   SpO2 97%   BMI 26.11 kg/m       Objective:   Physical Exam Vitals and nursing note reviewed.  Constitutional:      General: He is not in acute distress.  Appearance: Normal appearance. He is not ill-appearing.  HENT:     Head: Normocephalic and atraumatic.     Right Ear: Tympanic membrane, ear canal and external ear normal. There is impacted cerumen.     Left Ear: Tympanic membrane, ear canal and external ear normal. There is impacted cerumen.     Nose: Nose normal. No congestion or rhinorrhea.     Mouth/Throat:     Mouth: Mucous membranes are moist.     Pharynx: Oropharynx is clear.  Eyes:     Extraocular Movements: Extraocular movements intact.     Conjunctiva/sclera: Conjunctivae normal.     Pupils: Pupils are equal, round, and reactive to light.  Neck:     Vascular: No carotid bruit.  Cardiovascular:     Rate and Rhythm: Normal rate and regular rhythm.     Pulses: Normal pulses.     Heart sounds: No murmur heard.    No friction rub. No gallop.  Pulmonary:     Effort: Pulmonary effort is normal.     Breath sounds: Normal breath sounds.  Abdominal:     General: Abdomen is flat. Bowel sounds are normal. There is no distension.     Palpations: Abdomen is soft. There is no mass.     Tenderness: There is no abdominal tenderness. There is no guarding or rebound.     Hernia: No hernia is present.  Musculoskeletal:        General: Normal range of motion.     Cervical back: Normal range of motion and neck supple.  Lymphadenopathy:     Cervical: No cervical adenopathy.  Skin:    General: Skin is warm and dry.      Capillary Refill: Capillary refill takes less than 2 seconds.  Neurological:     General: No focal deficit present.     Mental Status: He is alert and oriented to person, place, and time.  Psychiatric:        Mood and Affect: Mood normal.        Behavior: Behavior normal.        Thought Content: Thought content normal.        Judgment: Judgment normal.        Assessment & Plan:  1. Routine general medical examination at a health care facility (Primary) Today patient counseled on age appropriate routine health concerns for screening and prevention, each reviewed and up to date or declined. Immunizations reviewed and up to date or declined. Labs ordered and reviewed. Risk factors for depression reviewed and negative. Hearing function and visual acuity are intact. ADLs screened and addressed as needed. Functional ability and level of safety reviewed and appropriate. Education, counseling and referrals performed based on assessed risks today. Patient provided with a copy of personalized plan for preventive services. - He will get his vaccinations at a local pharmacy  - Continue to exercise and eat health y - Follow up in one year or sooner if needed  2. MVP (mitral valve prolapse) - Per cardiology  - Lipid panel; Future - TSH; Future - CBC; Future - Comprehensive metabolic panel with GFR; Future  3. Mixed hyperlipidemia - Consider dose change of statin  - Lipid panel; Future - TSH; Future - CBC; Future - Comprehensive metabolic panel with GFR; Future  4. Benign prostatic hyperplasia with nocturia - Continue with Flomax   - PSA; Future  5. Bilateral impacted cerumen - Unable to successfully remove cerumen impactions in the office today. Will have him use Debrox  at home and he will come back at the end of the week   Darleene Shape, NP

## 2023-10-18 ENCOUNTER — Ambulatory Visit: Payer: Self-pay | Admitting: Adult Health

## 2023-10-19 ENCOUNTER — Ambulatory Visit: Admitting: Adult Health

## 2023-10-19 VITALS — BP 130/80 | HR 90 | Temp 98.1°F | Ht 70.0 in | Wt 182.0 lb

## 2023-10-19 DIAGNOSIS — H6123 Impacted cerumen, bilateral: Secondary | ICD-10-CM | POA: Diagnosis not present

## 2023-10-19 NOTE — Progress Notes (Signed)
 Subjective:    Patient ID: Alan Mcguire, male    DOB: 02-07-1956, 68 y.o.   MRN: [id number]  HPI 68 year old male who  has a past medical history of Chronic lower back pain, Hyperlipidemia, Lumbar disc disease, Murmur (9/92011), MVP (mitral valve prolapse), Non-rheumatic mitral regurgitation, Pneumonia (1990s), and S/P minimally invasive mitral valve repair (01/24/2017).  He presents to the office today for cerumen impaction. He was seen earlier this week and cerumen could not be removed. He has been applying debrox to hopefully soften up his ear wax.    Review of Systems See HPI   Past Medical History:  Diagnosis Date   Chronic lower back pain    Hyperlipidemia    borderline   Lumbar disc disease    L4-5   Murmur 9/92011   echo-MVP posterior leaflet with moderate mitral insufficiency and the jet directed anteriorly,left atrium mildly enlarged   MVP (mitral valve prolapse)    Non-rheumatic mitral regurgitation    Pneumonia 1990s   hx walking pneumonia   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   Complex valvuloplasty including triangular resection of flail segment of posterior leaflet, artificial Gore-tex neochord placement x4 and 34 mm Sorin Memo 3D ring annuloplasty via right mini thoracotomy approach    Social History   Socioeconomic History   Marital status: Married    Spouse name: Not on file   Number of children: Not on file   Years of education: Not on file   Highest education level: Not on file  Occupational History   Not on file  Tobacco Use   Smoking status: Never   Smokeless tobacco: Never  Vaping Use   Vaping status: Never Used  Substance and Sexual Activity   Alcohol use: Yes    Alcohol/week: 2.0 standard drinks of alcohol    Types: 2 Cans of beer per week    Comment: on weekends   Drug use: No   Sexual activity: Yes    Partners: Female    Comment: married  Other Topics Concern   Not on file  Social History Narrative    Primary school teacher company    Married    4 kids ( 2 in college and 2 married)    He likes to play golf and work in the yard.    He coaches swimming   Social Drivers of Corporate investment banker Strain: Not on file  Food Insecurity: Not on file  Transportation Needs: Not on file  Physical Activity: Not on file  Stress: Not on file  Social Connections: Not on file  Intimate Partner Violence: Not on file    Past Surgical History:  Procedure Laterality Date   CARDIAC CATHETERIZATION     COLONOSCOPY     MITRAL VALVE REPAIR Right 01/24/2017   Procedure: MINIMALLY INVASIVE MITRAL VALVE REPAIR (MVR);  Surgeon: Dusty Sudie DEL, MD;  Location: [facility] OR;  Service: Open Heart Surgery;  Laterality: Right;   POLYPECTOMY     RIGHT/LEFT HEART CATH AND CORONARY ANGIOGRAPHY N/A 10/28/2016   Procedure: Right/Left Heart Cath and Coronary Angiography;  Surgeon: Swaziland, [name], MD;  Location: [facility] INVASIVE CV LAB;  Service: Cardiovascular;  Laterality: N/A;   TEE WITHOUT CARDIOVERSION N/A 09/21/2016   Procedure: TRANSESOPHAGEAL ECHOCARDIOGRAM (TEE);  Surgeon: Raford Riggs, MD;  Location: [facility] ENDOSCOPY;  Service: Cardiovascular;  Laterality: N/A;   TEE WITHOUT CARDIOVERSION N/A 01/24/2017   Procedure: TRANSESOPHAGEAL ECHOCARDIOGRAM (TEE);  Surgeon: Dusty Sudie DEL, MD;  Location: [facility] OR;  Service: Open Heart Surgery;  Laterality: N/A;   VASECTOMY     WISDOM TOOTH EXTRACTION      Family History  Problem Relation Age of Onset   Hypertension Mother    Lung cancer Mother    Kidney failure Father        died   Kidney disease Father    Colon cancer Neg Hx    Colon polyps Neg Hx    Esophageal cancer Neg Hx    Rectal cancer Neg Hx    Stomach cancer Neg Hx     Allergies  Allergen Reactions   Bee Venom Anaphylaxis    Current Outpatient Medications on File Prior to Visit  Medication Sig Dispense Refill   aspirin  81 MG tablet Take 81 mg by mouth daily.     atorvastatin  (LIPITOR) 20 MG  tablet Take 1 tablet (20 mg total) by mouth every other day. 45 tablet 3   EPINEPHrine  0.3 mg/0.3 mL IJ SOAJ injection Inject 0.3 mg into the muscle as needed for anaphylaxis. 2 each 1   Multiple Vitamin (MULTIVITAMIN) capsule Take 1 capsule by mouth 3 (three) times a week.      tamsulosin  (FLOMAX ) 0.4 MG CAPS capsule Take 1 capsule (0.4 mg total) by mouth daily. 90 capsule 0   No current facility-administered medications on file prior to visit.    BP 130/80   Pulse 90   Temp 98.1 F (36.7 C) (Oral)   Ht 5' 10 (1.778 m)   Wt 182 lb (82.6 kg)   SpO2 97%   BMI 26.11 kg/m       Objective:   Physical Exam Vitals and nursing note reviewed.  Constitutional:      Appearance: Normal appearance.  HENT:     Right Ear: There is impacted cerumen.     Left Ear: There is impacted cerumen.  Skin:    General: Skin is warm and dry.  Neurological:     General: No focal deficit present.     Mental Status: He is alert and oriented to person, place, and time.  Psychiatric:        Mood and Affect: Mood normal.        Behavior: Behavior normal.        Thought Content: Thought content normal.        Judgment: Judgment normal.        Assessment & Plan:  1. Bilateral impacted cerumen (Primary) Ear Cerumen Removal  Date/Time: 10/19/2023 11:04 AM  Performed by: Merna Huxley, NP Authorized by: Merna Huxley, NP  Ceruminolytics applied: Ceruminolytics applied prior to the procedure. Location details: right ear and left ear Patient tolerance: patient tolerated the procedure well with no immediate complications Procedure type: irrigation  Sedation: Patient sedated: no

## 2023-11-20 ENCOUNTER — Other Ambulatory Visit: Payer: Self-pay | Admitting: Adult Health

## 2023-11-20 DIAGNOSIS — E782 Mixed hyperlipidemia: Secondary | ICD-10-CM

## 2023-11-21 ENCOUNTER — Other Ambulatory Visit: Payer: Self-pay

## 2023-11-21 ENCOUNTER — Telehealth: Payer: Self-pay | Admitting: *Deleted

## 2023-11-21 DIAGNOSIS — E782 Mixed hyperlipidemia: Secondary | ICD-10-CM

## 2023-11-21 MED ORDER — ATORVASTATIN CALCIUM 20 MG PO TABS
20.0000 mg | ORAL_TABLET | ORAL | 3 refills | Status: AC
Start: 2023-11-21 — End: ?

## 2023-11-21 NOTE — Telephone Encounter (Signed)
 Rx sent to pharmacy

## 2023-11-21 NOTE — Telephone Encounter (Signed)
 Copied from CRM #[id number]. Topic: Clinical - Medication Question >> Nov 21, 2023  9:32 AM Revonda D wrote: Reason for CRM: Mliss with [facility] is calling to request a medication refill for the atorvastatin  (LIPITOR) 20 MG tablet. I informed Mliss that the request was sent yesterday and is currently pending. Julie would like for the pt to be contacted when the medication has been approved.

## 2024-01-03 ENCOUNTER — Encounter: Payer: Self-pay | Admitting: Adult Health

## 2024-01-03 ENCOUNTER — Telehealth: Payer: Self-pay | Admitting: Adult Health

## 2024-01-03 DIAGNOSIS — N401 Enlarged prostate with lower urinary tract symptoms: Secondary | ICD-10-CM

## 2024-01-03 NOTE — Telephone Encounter (Signed)
 Ok to fill? Not on med list anymore

## 2024-01-03 NOTE — Telephone Encounter (Unsigned)
 Copied from CRM [id number]. Topic: Clinical - Medication Refill >> Jan 03, 2024  9:22 AM [place] wrote: Medication: tamsulosin  (FLOMAX ) 0.4 MG CAPS capsule  Has the patient contacted their pharmacy? Yes (Agent: If no, request that the patient contact the pharmacy for the refill. If patient does not wish to contact the pharmacy document the reason why and proceed with request.) (Agent: If yes, when and what did the pharmacy advise?)  This is the patient's preferred pharmacy:  [facility] - [place], [place] - [address] [place] [address] Phone: [phone] Fax: [phone]  Is this the correct pharmacy for this prescription? Yes If no, delete pharmacy and type the correct one.   Has the prescription been filled recently? Yes  Is the patient out of the medication? Yes  Has the patient been seen for an appointment in the last year OR does the patient have an upcoming appointment? Yes  Can we respond through MyChart? Yes  Agent: Please be advised that Rx refills may take up to 3 business days. We ask that you follow-up with your pharmacy.

## 2024-01-04 MED ORDER — TAMSULOSIN HCL 0.4 MG PO CAPS: 0.4000 mg | ORAL_CAPSULE | Freq: Every day | ORAL | 0 refills | Status: DC

## 2024-01-04 NOTE — Telephone Encounter (Signed)
 Tried to call pt no answer. Left detailed message advising of update.

## 2024-01-04 NOTE — Telephone Encounter (Signed)
 Medication refilled

## 2024-01-04 NOTE — Telephone Encounter (Signed)
 Ok to fill? This medication is not on pt medlist.

## 2024-04-01 ENCOUNTER — Other Ambulatory Visit: Payer: Self-pay | Admitting: Adult Health

## 2024-04-01 DIAGNOSIS — N401 Enlarged prostate with lower urinary tract symptoms: Secondary | ICD-10-CM

## 2024-04-02 ENCOUNTER — Encounter: Payer: Self-pay | Admitting: Adult Health

## 2024-04-02 DIAGNOSIS — N401 Enlarged prostate with lower urinary tract symptoms: Secondary | ICD-10-CM

## 2024-04-03 MED ORDER — TAMSULOSIN HCL 0.4 MG PO CAPS: 0.4000 mg | ORAL_CAPSULE | Freq: Every day | ORAL | 2 refills | Status: AC

## 2024-05-03 ENCOUNTER — Ambulatory Visit: Admitting: Cardiology
